# Patient Record
Sex: Male | Born: 1960 | Race: White | Hispanic: No | Smoking: Former smoker
Health system: Southern US, Community
[De-identification: ages and names within clinical notes are randomized; demographics above are authoritative.]

## PROBLEM LIST (undated history)

## (undated) DIAGNOSIS — J45909 Unspecified asthma, uncomplicated: Secondary | ICD-10-CM

## (undated) DIAGNOSIS — G4730 Sleep apnea, unspecified: Secondary | ICD-10-CM

## (undated) DIAGNOSIS — E875 Hyperkalemia: Secondary | ICD-10-CM

## (undated) DIAGNOSIS — Z8639 Personal history of other endocrine, nutritional and metabolic disease: Secondary | ICD-10-CM

## (undated) DIAGNOSIS — K921 Melena: Secondary | ICD-10-CM

## (undated) DIAGNOSIS — M109 Gout, unspecified: Secondary | ICD-10-CM

## (undated) DIAGNOSIS — Z789 Other specified health status: Secondary | ICD-10-CM

## (undated) DIAGNOSIS — K429 Umbilical hernia without obstruction or gangrene: Secondary | ICD-10-CM

## (undated) DIAGNOSIS — G959 Disease of spinal cord, unspecified: Secondary | ICD-10-CM

## (undated) DIAGNOSIS — E785 Hyperlipidemia, unspecified: Secondary | ICD-10-CM

## (undated) DIAGNOSIS — I10 Essential (primary) hypertension: Secondary | ICD-10-CM

## (undated) DIAGNOSIS — F339 Major depressive disorder, recurrent, unspecified: Secondary | ICD-10-CM

## (undated) DIAGNOSIS — M545 Low back pain, unspecified: Secondary | ICD-10-CM

## (undated) DIAGNOSIS — G8929 Other chronic pain: Secondary | ICD-10-CM

## (undated) DIAGNOSIS — G5600 Carpal tunnel syndrome, unspecified upper limb: Secondary | ICD-10-CM

## (undated) DIAGNOSIS — E119 Type 2 diabetes mellitus without complications: Secondary | ICD-10-CM

## (undated) DIAGNOSIS — R0789 Other chest pain: Secondary | ICD-10-CM

## (undated) DIAGNOSIS — R202 Paresthesia of skin: Secondary | ICD-10-CM

## (undated) DIAGNOSIS — G4733 Obstructive sleep apnea (adult) (pediatric): Secondary | ICD-10-CM

## (undated) HISTORY — DX: Paresthesia of skin: R20.2

## (undated) HISTORY — DX: Major depressive disorder, recurrent, unspecified: F33.9

## (undated) HISTORY — DX: Other specified health status: Z78.9

## (undated) HISTORY — DX: Sleep apnea, unspecified: G47.30

## (undated) HISTORY — DX: Type 2 diabetes mellitus without complications: E11.9

## (undated) HISTORY — DX: Hyperkalemia: E87.5

## (undated) HISTORY — DX: Disease of spinal cord, unspecified: G95.9

## (undated) HISTORY — DX: Obstructive sleep apnea (adult) (pediatric): G47.33

## (undated) HISTORY — DX: Umbilical hernia without obstruction or gangrene: K42.9

## (undated) HISTORY — DX: Other chronic pain: G89.29

## (undated) HISTORY — DX: Carpal tunnel syndrome, unspecified upper limb: G56.00

## (undated) HISTORY — DX: Unspecified asthma, uncomplicated: J45.909

## (undated) HISTORY — DX: Low back pain: M54.5

## (undated) HISTORY — DX: Gout, unspecified: M10.9

## (undated) HISTORY — DX: Melena: K92.1

## (undated) HISTORY — DX: Hyperlipidemia, unspecified: E78.5

## (undated) HISTORY — PX: WISDOM TOOTH EXTRACTION: SHX21

## (undated) HISTORY — DX: Personal history of other endocrine, nutritional and metabolic disease: Z86.39

## (undated) HISTORY — DX: Low back pain, unspecified: M54.50

## (undated) HISTORY — DX: Other chest pain: R07.89

## (undated) HISTORY — PX: VASECTOMY: SHX75

## (undated) HISTORY — DX: Essential (primary) hypertension: I10

---

## 2005-07-12 ENCOUNTER — Ambulatory Visit (HOSPITAL_COMMUNITY): Admission: RE | Admit: 2005-07-12 | Discharge: 2005-07-12 | Payer: Self-pay | Admitting: Orthopedic Surgery

## 2014-05-31 ENCOUNTER — Encounter: Payer: Self-pay | Admitting: Family Medicine

## 2014-05-31 ENCOUNTER — Ambulatory Visit (INDEPENDENT_AMBULATORY_CARE_PROVIDER_SITE_OTHER): Payer: BC Managed Care – PPO | Admitting: Family Medicine

## 2014-05-31 VITALS — BP 157/102 | HR 71 | Temp 98.8°F | Resp 18 | Ht 64.5 in | Wt 197.0 lb

## 2014-05-31 DIAGNOSIS — R030 Elevated blood-pressure reading, without diagnosis of hypertension: Secondary | ICD-10-CM

## 2014-05-31 DIAGNOSIS — IMO0001 Reserved for inherently not codable concepts without codable children: Secondary | ICD-10-CM

## 2014-05-31 DIAGNOSIS — M1000 Idiopathic gout, unspecified site: Secondary | ICD-10-CM

## 2014-05-31 DIAGNOSIS — R0789 Other chest pain: Secondary | ICD-10-CM

## 2014-05-31 DIAGNOSIS — Z0000 Encounter for general adult medical examination without abnormal findings: Secondary | ICD-10-CM

## 2014-05-31 DIAGNOSIS — Z125 Encounter for screening for malignant neoplasm of prostate: Secondary | ICD-10-CM

## 2014-05-31 NOTE — Progress Notes (Signed)
Office Note 06/12/2014  CC:  Chief Complaint  Patient presents with  . Establish Care    HPI:  Derrick Willis is a 53 y.o. White male who is here to establish care. Patient's most recent primary MD: [facility] for acute problems primarily--no specific MD as PMD. Old records were not reviewed prior to or during today's visit.  Has not had "regular" medical care for years. Reports hx of white coat HTN.  BP at home 130s/70s, HR 50s.  Having pressure in left lower anterior chest wall, "feels like it's in my lung"--chronically over the last several years.  Feels it constantly, worse with deep breaths.  Dx's with pleurisy in the past.   No exercise induced CP, nausea, diaphoresis, palpitations, jaw pain, or left arm pain.  Hx of BRBPR off and on, hx of hemorrhoids.  Has umbillical hernia x 1 yr, no pain but it bothers him when he does strenuous work b/c of pressure and discomfort in the area.  Past Medical History  Diagnosis Date  . Asthma     "grew out of it", esp after quitting smoking arond age 30 yrs old  . Blood in stool   . Hypertension     never on meds  . Hyperlipidemia     med x 1 trial=adverse side effects so he stopped it  . Gout     Knee and toe--allopurinol since approx 2012  . Atypical chest pain 06/12/2014    Past Surgical History  Procedure Laterality Date  . Vasectomy    . Wisdom tooth extraction      Family History  Problem Relation Age of Onset  . Arthritis Mother   . Arthritis Father   . Hyperlipidemia Father   . Hypertension Father     History   Social History  . Marital Status: Legally Separated    Spouse Name: N/A    Number of Children: N/A  . Years of Education: N/A   Occupational History  . Not on file.   Social History Main Topics  . Smoking status: Former Smoker    Quit date: 05/31/1990  . Smokeless tobacco: Never Used  . Alcohol Use: 0.6 - 1.2 oz/week    1-2 Glasses of wine per week     Comment: 3 x weekly  . Drug Use: No  .  Sexual Activity: Not on file   Other Topics Concern  . Not on file   Social History Narrative   Divorced.  Two children, both young adults (one son plans to go to med school).   Occupation: Grading contractor-also a Psychologist, sport and exercise.   Education: 2 years of college.   Orig from [place], still lives there.   Tob 30 pack-yr hx, quit around age 1.   Social drinker.     No regular exercise.  Eating habits poor.    Outpatient Encounter Prescriptions as of 05/31/2014  Medication Sig  . ALLOPURINOL PO Take 150 mg by mouth.  Marland Kitchen aspirin 81 MG tablet Take 81 mg by mouth daily.    No Known Allergies  ROS Review of Systems  Constitutional: Negative for fever and fatigue.  HENT: Negative for congestion and sore throat.   Eyes: Negative for visual disturbance.  Respiratory: Negative for cough and shortness of breath.   Cardiovascular: Negative for chest pain.  Gastrointestinal: Negative for nausea and abdominal pain.  Genitourinary: Negative for dysuria.  Musculoskeletal: Negative for back pain and joint swelling.  Skin: Negative for rash.  Neurological: Negative for weakness and headaches.  Hematological: Negative for adenopathy.    PE; Blood pressure 157/102, pulse 71, temperature 98.8 F (37.1 C), temperature source Temporal, resp. rate 18, height 5' 4.5" (1.638 m), weight 197 lb (89.359 kg), SpO2 97.00%. Gen: Alert, well appearing.  Patient is oriented to person, place, time, and situation. FEO:FHQR: no injection, icteris, swelling, or exudate.  EOMI, PERRLA. Mouth: lips without lesion/swelling.  Oral mucosa pink and moist. Oropharynx without erythema, exudate, or swelling.  CV: RRR, no m/r/g.   LUNGS: CTA bilat, nonlabored resps, good aeration in all lung fields. EXT: no clubbing, cyanosis, or edema.   Pertinent labs:  none  ASSESSMENT AND PLAN:   New pt: no pertinent old records to obtain.  Atypical chest pain Chronic, stable problem most likely related to some scarring from past  episode of pleurisy. Reassured.   Returne in 1-2 wks at pt's convenience for CPE, lab visit for fasting HP prior to this. He is due for routine colon and prostate cancer screening.  White coat hypertension Would like pt to continue to check bp at any chance he gets outside of medical office setting.   Will encourage him to invest in home bp cuff for ease of home monitoring so we don't miss dx of true HTN.   An After Visit Summary was printed and given to the patient.  Return for Lab visit ASAP (fasting).  CPE at pt's convenience in 1-2 weeks.Marland Kitchen

## 2014-05-31 NOTE — Progress Notes (Signed)
Pre visit review using our clinic review tool, if applicable. No additional management support is needed unless otherwise documented below in the visit note. 

## 2014-06-01 ENCOUNTER — Other Ambulatory Visit (INDEPENDENT_AMBULATORY_CARE_PROVIDER_SITE_OTHER): Payer: BC Managed Care – PPO

## 2014-06-01 DIAGNOSIS — Z0000 Encounter for general adult medical examination without abnormal findings: Secondary | ICD-10-CM

## 2014-06-01 DIAGNOSIS — Z125 Encounter for screening for malignant neoplasm of prostate: Secondary | ICD-10-CM

## 2014-06-01 DIAGNOSIS — M1000 Idiopathic gout, unspecified site: Secondary | ICD-10-CM

## 2014-06-01 LAB — LIPID PANEL
Cholesterol: 197 mg/dL (ref 0–200)
HDL: 44.8 mg/dL (ref >39.00)
LDL Cholesterol: 131 mg/dL — ABNORMAL HIGH (ref 0–99)
NonHDL: 152.2
Total CHOL/HDL Ratio: 4
Triglycerides: 105 mg/dL (ref 0.0–149.0)
VLDL: 21 mg/dL (ref 0.0–40.0)

## 2014-06-01 LAB — COMPREHENSIVE METABOLIC PANEL
ALT: 49 U/L (ref 0–53)
AST: 25 U/L (ref 0–37)
Albumin: 3.7 g/dL (ref 3.5–5.2)
Alkaline Phosphatase: 66 U/L (ref 39–117)
BUN: 16 mg/dL (ref 6–23)
CO2: 27 mEq/L (ref 19–32)
Calcium: 9.3 mg/dL (ref 8.4–10.5)
Chloride: 106 mEq/L (ref 96–112)
Creatinine, Ser: 1.1 mg/dL (ref 0.4–1.5)
GFR: 77.46 mL/min (ref >60.00)
Glucose, Bld: 151 mg/dL — ABNORMAL HIGH (ref 70–99)
Potassium: 5.6 mEq/L — ABNORMAL HIGH (ref 3.5–5.1)
Sodium: 142 mEq/L (ref 135–145)
Total Bilirubin: 0.6 mg/dL (ref 0.2–1.2)
Total Protein: 7 g/dL (ref 6.0–8.3)

## 2014-06-01 LAB — CBC WITH DIFFERENTIAL/PLATELET
Basophils Absolute: 0.1 10*3/uL (ref 0.0–0.1)
Basophils Relative: 0.5 % (ref 0.0–3.0)
Eosinophils Absolute: 0.2 10*3/uL (ref 0.0–0.7)
Eosinophils Relative: 1.8 % (ref 0.0–5.0)
HCT: 49.5 % (ref 39.0–52.0)
Hemoglobin: 16.5 g/dL (ref 13.0–17.0)
Lymphocytes Relative: 20 % (ref 12.0–46.0)
Lymphs Abs: 1.9 10*3/uL (ref 0.7–4.0)
MCHC: 33.2 g/dL (ref 30.0–36.0)
MCV: 88 fl (ref 78.0–100.0)
Monocytes Absolute: 0.5 10*3/uL (ref 0.1–1.0)
Monocytes Relative: 5.2 % (ref 3.0–12.0)
Neutro Abs: 7.1 10*3/uL (ref 1.4–7.7)
Neutrophils Relative %: 72.5 % (ref 43.0–77.0)
Platelets: 196 10*3/uL (ref 150.0–400.0)
RBC: 5.63 Mil/uL (ref 4.22–5.81)
RDW: 12.8 % (ref 11.5–15.5)
WBC: 9.7 10*3/uL (ref 4.0–10.5)

## 2014-06-01 LAB — TSH: TSH: 1.91 u[IU]/mL (ref 0.35–4.50)

## 2014-06-01 LAB — PSA: PSA: 1.56 ng/mL (ref 0.10–4.00)

## 2014-06-01 LAB — URIC ACID: Uric Acid, Serum: 5.8 mg/dL (ref 4.0–7.8)

## 2014-06-03 ENCOUNTER — Other Ambulatory Visit: Payer: Self-pay | Admitting: Family Medicine

## 2014-06-03 DIAGNOSIS — E875 Hyperkalemia: Secondary | ICD-10-CM

## 2014-06-06 ENCOUNTER — Other Ambulatory Visit (INDEPENDENT_AMBULATORY_CARE_PROVIDER_SITE_OTHER): Payer: BC Managed Care – PPO

## 2014-06-06 DIAGNOSIS — E875 Hyperkalemia: Secondary | ICD-10-CM

## 2014-06-06 LAB — BASIC METABOLIC PANEL
BUN: 14 mg/dL (ref 6–23)
CO2: 30 meq/L (ref 19–32)
CREATININE: 1.1 mg/dL (ref 0.4–1.5)
Calcium: 9.5 mg/dL (ref 8.4–10.5)
Chloride: 105 mEq/L (ref 96–112)
GFR: 78.31 mL/min (ref >60.00)
GLUCOSE: 139 mg/dL — AB (ref 70–99)
Potassium: 5.2 mEq/L — ABNORMAL HIGH (ref 3.5–5.1)
SODIUM: 140 meq/L (ref 135–145)

## 2014-06-12 ENCOUNTER — Encounter: Payer: Self-pay | Admitting: Family Medicine

## 2014-06-12 DIAGNOSIS — R0789 Other chest pain: Secondary | ICD-10-CM | POA: Insufficient documentation

## 2014-06-12 DIAGNOSIS — IMO0001 Reserved for inherently not codable concepts without codable children: Secondary | ICD-10-CM | POA: Insufficient documentation

## 2014-06-12 HISTORY — DX: Other chest pain: R07.89

## 2014-06-12 NOTE — Assessment & Plan Note (Signed)
Would like pt to continue to check bp at any chance he gets outside of medical office setting.   Will encourage him to invest in home bp cuff for ease of home monitoring so we don't miss dx of true HTN.

## 2014-06-12 NOTE — Assessment & Plan Note (Signed)
Chronic, stable problem most likely related to some scarring from past episode of pleurisy. Reassured.   Returne in 1-2 wks at pt's convenience for CPE, lab visit for fasting HP prior to this. He is due for routine colon and prostate cancer screening.

## 2014-08-26 DIAGNOSIS — K429 Umbilical hernia without obstruction or gangrene: Secondary | ICD-10-CM

## 2014-08-26 HISTORY — DX: Umbilical hernia without obstruction or gangrene: K42.9

## 2014-09-05 ENCOUNTER — Encounter: Payer: Self-pay | Admitting: Gastroenterology

## 2014-09-05 ENCOUNTER — Ambulatory Visit (INDEPENDENT_AMBULATORY_CARE_PROVIDER_SITE_OTHER): Payer: 59 | Admitting: Family Medicine

## 2014-09-05 ENCOUNTER — Encounter: Payer: Self-pay | Admitting: Family Medicine

## 2014-09-05 VITALS — BP 152/90 | HR 62 | Temp 98.1°F | Resp 18 | Ht 65.5 in | Wt 199.0 lb

## 2014-09-05 DIAGNOSIS — E875 Hyperkalemia: Secondary | ICD-10-CM

## 2014-09-05 DIAGNOSIS — Z1211 Encounter for screening for malignant neoplasm of colon: Secondary | ICD-10-CM

## 2014-09-05 DIAGNOSIS — R739 Hyperglycemia, unspecified: Secondary | ICD-10-CM

## 2014-09-05 LAB — HEMOGLOBIN A1C: Hgb A1c MFr Bld: 7 % — ABNORMAL HIGH (ref 4.6–6.5)

## 2014-09-05 NOTE — Progress Notes (Signed)
Pre visit review using our clinic review tool, if applicable. No additional management support is needed unless otherwise documented below in the visit note. 

## 2014-09-05 NOTE — Progress Notes (Signed)
OFFICE NOTE  09/05/2014  CC:  Chief Complaint  Patient presents with  . Follow-up   HPI: Patient is a 54 y.o. Caucasian male who is here for "follow up blood results". Reviewed potassium recheck that patient never got word of. Reviewed glucoses as well: says he was fasting for these.  Denies polydipsia but endorses polyuria. Denies hx of hyperglycemia.  Pertinent PMH:  Past Medical History  Diagnosis Date  . Asthma     "grew out of it", esp after quitting smoking arond age 71 yrs old  . Blood in stool   . Hypertension     never on meds  . Hyperlipidemia     med x 1 trial=adverse side effects so he stopped it  . Gout     Knee and toe--allopurinol since approx 2012  . Atypical chest pain 06/12/2014   Past Surgical History  Procedure Laterality Date  . Vasectomy    . Wisdom tooth extraction      MEDS:  Outpatient Prescriptions Prior to Visit  Medication Sig Dispense Refill  . ALLOPURINOL PO Take 150 mg by mouth.    Marland Kitchen aspirin 81 MG tablet Take 81 mg by mouth daily.     No facility-administered medications prior to visit.    PE: Blood pressure 152/90, pulse 62, temperature 98.1 F (36.7 C), temperature source Oral, resp. rate 18, height 5' 5.5" (1.664 m), weight 199 lb (90.266 kg), SpO2 97 %. Gen: Alert, well appearing.  Patient is oriented to person, place, time, and situation. No further exam today.  LAB: none today RECENT: Glucose 151 on 06/01/14 and 139 on 06/06/14.  Lab Results  Component Value Date   WBC 9.7 06/01/2014   HGB 16.5 06/01/2014   HCT 49.5 06/01/2014   MCV 88.0 06/01/2014   PLT 196.0 06/01/2014   Lab Results  Component Value Date   CHOL 197 06/01/2014   HDL 44.80 06/01/2014   LDLCALC 131* 06/01/2014   TRIG 105.0 06/01/2014   CHOLHDL 4 06/01/2014     Chemistry      Component Value Date/Time   NA 140 06/06/2014 0840   K 5.2* 06/06/2014 0840   CL 105 06/06/2014 0840   CO2 30 06/06/2014 0840   BUN 14 06/06/2014 0840   CREATININE 1.1  06/06/2014 0840      Component Value Date/Time   CALCIUM 9.5 06/06/2014 0840   ALKPHOS 66 06/01/2014 0812   AST 25 06/01/2014 0812   ALT 49 06/01/2014 0812   BILITOT 0.6 06/01/2014 0812     Lab Results  Component Value Date   TSH 1.91 06/01/2014   Lab Results  Component Value Date   PSA 1.56 06/01/2014    IMPRESSION AND PLAN:  1) Hx of mild hyperkalemia: pt runs borderline high as his normal. He admits he was taking some dietary supplements when his potassium came back 5.6. He is off of these now, low K diet discussed and handout given to patient today.  2) Hyperglycemia: I did not know he was fasting on both both draws that showed glucoses >126, but he says today that he was. He likely has DM 2.  Will repeat random glucose today and HbA1c.  3) Colon cancer screening: pt asked for referral today for screening colonoscopy b/c he says he has never had one. Referral ordered as per pt request.  An After Visit Summary was printed and given to the patient.  FOLLOW UP:  66mo for fasting CPE, earlier if needed based on sx's or  lab results.

## 2014-09-06 ENCOUNTER — Other Ambulatory Visit: Payer: Self-pay | Admitting: Family Medicine

## 2014-09-06 ENCOUNTER — Encounter: Payer: Self-pay | Admitting: Family Medicine

## 2014-09-06 DIAGNOSIS — E119 Type 2 diabetes mellitus without complications: Secondary | ICD-10-CM

## 2014-09-06 DIAGNOSIS — E875 Hyperkalemia: Secondary | ICD-10-CM

## 2014-09-06 DIAGNOSIS — I10 Essential (primary) hypertension: Secondary | ICD-10-CM

## 2014-09-06 LAB — GLUCOSE, RANDOM: Glucose, Bld: 145 mg/dL — ABNORMAL HIGH (ref 70–99)

## 2014-09-06 MED ORDER — METFORMIN HCL 500 MG PO TABS: 500.0000 mg | ORAL_TABLET | Freq: Two times a day (BID) | ORAL | Status: DC

## 2014-09-26 ENCOUNTER — Encounter: Payer: Self-pay | Admitting: Family Medicine

## 2014-09-26 ENCOUNTER — Telehealth: Payer: Self-pay | Admitting: Family Medicine

## 2014-09-26 DIAGNOSIS — I10 Essential (primary) hypertension: Secondary | ICD-10-CM

## 2014-09-26 DIAGNOSIS — E875 Hyperkalemia: Secondary | ICD-10-CM

## 2014-09-26 NOTE — Telephone Encounter (Signed)
Pls call pt and tell him to get a blood draw at [facility] lab when he goes to that building for his GI appt (looks like he's on their schedule 09/29/14).  Pls tell him where lab is in that building (basement). Labs are ordered: it is to repeat his potassium b/c of history of elevated potassium. 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

## 2014-09-26 NOTE — Telephone Encounter (Signed)
Patient aware.  He is taking lisinopril.

## 2014-09-26 NOTE — Telephone Encounter (Signed)
LMOM for pt to CB.  

## 2014-09-26 NOTE — Telephone Encounter (Signed)
Noted  

## 2014-09-29 ENCOUNTER — Other Ambulatory Visit (INDEPENDENT_AMBULATORY_CARE_PROVIDER_SITE_OTHER): Payer: 59

## 2014-09-29 ENCOUNTER — Ambulatory Visit (AMBULATORY_SURGERY_CENTER): Payer: Self-pay | Admitting: *Deleted

## 2014-09-29 VITALS — Ht 65.0 in | Wt 194.6 lb

## 2014-09-29 DIAGNOSIS — E875 Hyperkalemia: Secondary | ICD-10-CM

## 2014-09-29 DIAGNOSIS — I10 Essential (primary) hypertension: Secondary | ICD-10-CM

## 2014-09-29 DIAGNOSIS — Z1211 Encounter for screening for malignant neoplasm of colon: Secondary | ICD-10-CM

## 2014-09-29 LAB — BASIC METABOLIC PANEL
BUN: 17 mg/dL (ref 6–23)
CO2: 29 mEq/L (ref 19–32)
CREATININE: 1.14 mg/dL (ref 0.40–1.50)
Calcium: 9.5 mg/dL (ref 8.4–10.5)
Chloride: 107 mEq/L (ref 96–112)
GFR: 71.13 mL/min (ref >60.00)
Glucose, Bld: 111 mg/dL — ABNORMAL HIGH (ref 70–99)
Potassium: 4.9 mEq/L (ref 3.5–5.1)
Sodium: 142 mEq/L (ref 135–145)

## 2014-09-29 MED ORDER — MOVIPREP 100 G PO SOLR: 1.0000 | Freq: Once | ORAL | Status: DC

## 2014-09-29 NOTE — Progress Notes (Signed)
No egg or soy allergy No home 02 use No diet pills No past sedation per pt. emmi video to e mail

## 2014-10-13 ENCOUNTER — Ambulatory Visit (AMBULATORY_SURGERY_CENTER): Payer: 59 | Admitting: Gastroenterology

## 2014-10-13 ENCOUNTER — Encounter: Payer: Self-pay | Admitting: Gastroenterology

## 2014-10-13 VITALS — BP 105/66 | HR 50 | Temp 96.8°F | Resp 17 | Ht 65.0 in | Wt 194.0 lb

## 2014-10-13 DIAGNOSIS — D124 Benign neoplasm of descending colon: Secondary | ICD-10-CM

## 2014-10-13 DIAGNOSIS — K635 Polyp of colon: Secondary | ICD-10-CM

## 2014-10-13 DIAGNOSIS — Z1211 Encounter for screening for malignant neoplasm of colon: Secondary | ICD-10-CM

## 2014-10-13 HISTORY — PX: COLONOSCOPY W/ POLYPECTOMY: SHX1380

## 2014-10-13 MED ORDER — SODIUM CHLORIDE 0.9 % IV SOLN: 500.0000 mL | INTRAVENOUS | Status: DC

## 2014-10-13 NOTE — Op Note (Signed)
[facility]  [address]. [place], [id number]   COLONOSCOPY PROCEDURE REPORT  PATIENT: Derrick Willis, Derrick Willis  MR#: [id number] BIRTHDATE: 04/18/1961 , 62  yrs. old GENDER: male ENDOSCOPIST: Ladene Artist, MD, FACG REFERRED [id number], Phillip PROCEDURE DATE:  10/13/2014 PROCEDURE:   Colonoscopy with snare polypectomy First Screening Colonoscopy - Avg.  risk and is 50 yrs.  old or older Yes.  Prior Negative Screening - Now for repeat screening. N/A  History of Adenoma - Now for follow-up colonoscopy & has been > or = to 3 yrs.  N/A  Polyps Removed Today? Yes. ASA CLASS:   Class II INDICATIONS:average risk patient for colorectal cancer. MEDICATIONS: Monitored anesthesia care and Propofol 200 mg IV DESCRIPTION OF PROCEDURE:   After the risks benefits and alternatives of the procedure were thoroughly explained, informed consent was obtained.  The digital rectal exam revealed no abnormalities of the rectum.   The LB [id number]  endoscope was introduced through the anus and advanced to the cecum, which was identified by both the appendix and ileocecal valve. No adverse events experienced.   The quality of the prep was excellent, using MoviPrep  The instrument was then slowly withdrawn as the colon was fully examined.    COLON FINDINGS: A pedunculated polyp measuring 7 mm in size was found in the descending colon.  A polypectomy was performed with a cold snare.  The resection was complete, the polyp tissue was completely retrieved and sent to histology.   The examination was otherwise normal.  Retroflexed views revealed internal Grade I hemorrhoids. The time to cecum=1 minutes 11 seconds.  Withdrawal time=10 minutes 15 seconds.  The scope was withdrawn and the procedure completed. COMPLICATIONS: There were no immediate complications.  ENDOSCOPIC IMPRESSION: 1.   Pedunculated polyp in the descending colon; polypectomy performed with a cold snare 2.   Grade l  internal hemorrhoids  RECOMMENDATIONS: 1.  Hold Aspirin and all other NSAIDS for 2 weeks. 2.  Await pathology results 3.  Repeat colonoscopy in 5 years if polyp adenomatous; otherwise 10 years  eSigned:  Ladene Artist, MD, [facility] 10/13/2014 8:49 AM

## 2014-10-13 NOTE — Progress Notes (Signed)
Called to room to assist during endoscopic procedure.  Patient ID and intended procedure confirmed with present staff. Received instructions for my participation in the procedure from the performing physician.  

## 2014-10-13 NOTE — Patient Instructions (Signed)
YOU HAD AN ENDOSCOPIC PROCEDURE TODAY AT THE [facility]: Refer to the procedure report that was given to you for any specific questions about what was found during the examination.  If the procedure report does not answer your questions, please call your gastroenterologist to clarify.  If you requested that your care partner not be given the details of your procedure findings, then the procedure report has been included in a sealed envelope for you to review at your convenience later.  YOU SHOULD EXPECT: Some feelings of bloating in the abdomen. Passage of more gas than usual.  Walking can help get rid of the air that was put into your GI tract during the procedure and reduce the bloating. If you had a lower endoscopy (such as a colonoscopy or flexible sigmoidoscopy) you may notice spotting of blood in your stool or on the toilet paper. If you underwent a bowel prep for your procedure, then you may not have a normal bowel movement for a few days.  DIET: Your first meal following the procedure should be a light meal and then it is ok to progress to your normal diet.  A half-sandwich or bowl of soup is an example of a good first meal.  Heavy or fried foods are harder to digest and may make you feel nauseous or bloated.  Likewise meals heavy in dairy and vegetables can cause extra gas to form and this can also increase the bloating.  Drink plenty of fluids but you should avoid alcoholic beverages for 24 hours.  ACTIVITY: Your care partner should take you home directly after the procedure.  You should plan to take it easy, moving slowly for the rest of the day.  You can resume normal activity the day after the procedure however you should NOT DRIVE or use heavy machinery for 24 hours (because of the sedation medicines used during the test).    SYMPTOMS TO REPORT IMMEDIATELY: A gastroenterologist can be reached at any hour.  During normal business hours, 8:30 AM to 5:00 PM Monday through Friday,  call [phone].  After hours and on weekends, please call the GI answering service at [phone] who will take a message and have the physician on call contact you.   Following lower endoscopy (colonoscopy or flexible sigmoidoscopy):  Excessive amounts of blood in the stool  Significant tenderness or worsening of abdominal pains  Swelling of the abdomen that is new, acute  Fever of 100F or higher  FOLLOW UP: If any biopsies were taken you will be contacted by phone or by letter within the next 1-3 weeks.  Call your gastroenterologist if you have not heard about the biopsies in 3 weeks.  Our staff will call the home number listed on your records the next business day following your procedure to check on you and address any questions or concerns that you may have at that time regarding the information given to you following your procedure. This is a courtesy call and so if there is no answer at the home number and we have not heard from you through the emergency physician on call, we will assume that you have returned to your regular daily activities without incident.  SIGNATURES/CONFIDENTIALITY: You and/or your care partner have signed paperwork which will be entered into your electronic medical record.  These signatures attest to the fact that that the information above on your After Visit Summary has been reviewed and is understood.  Full responsibility of the confidentiality of this   discharge information lies with you and/or your care-partner.  Polyps, hemorrhoids, high fiber diet-handouts given

## 2014-10-13 NOTE — Progress Notes (Signed)
A/ox3 pleased with MAC, report to April RN 

## 2014-10-14 ENCOUNTER — Telehealth: Payer: Self-pay | Admitting: *Deleted

## 2014-10-14 NOTE — Telephone Encounter (Signed)
  Follow up Call-  Call back number 10/13/2014  Post procedure Call Back phone  # [phone]  Permission to leave phone message Yes     Patient questions:  Do you have a fever, pain , or abdominal swelling? No. Pain Score  0 *  Have you tolerated food without any problems? Yes.    Have you been able to return to your normal activities? Yes.    Do you have any questions about your discharge instructions: Diet   No. Medications  No. Follow up visit  No.  Do you have questions or concerns about your Care? No.  Actions: * If pain score is 4 or above: No action needed, pain <4.

## 2014-10-18 ENCOUNTER — Encounter: Payer: Self-pay | Admitting: Gastroenterology

## 2014-11-07 ENCOUNTER — Encounter: Payer: 59 | Admitting: Nutrition

## 2014-11-21 ENCOUNTER — Telehealth: Payer: Self-pay | Admitting: Nutrition

## 2014-11-21 NOTE — Telephone Encounter (Signed)
Called and left message to call to make appointment. Jearld Fenton, RDN CDE

## 2014-11-28 ENCOUNTER — Encounter: Payer: Self-pay | Admitting: Family Medicine

## 2014-11-28 ENCOUNTER — Other Ambulatory Visit (INDEPENDENT_AMBULATORY_CARE_PROVIDER_SITE_OTHER): Payer: 59

## 2014-11-28 DIAGNOSIS — Z0000 Encounter for general adult medical examination without abnormal findings: Secondary | ICD-10-CM

## 2014-11-28 LAB — CBC WITH DIFFERENTIAL/PLATELET
Basophils Absolute: 0 10*3/uL (ref 0.0–0.1)
Basophils Relative: 0.6 % (ref 0.0–3.0)
Eosinophils Absolute: 0.2 10*3/uL (ref 0.0–0.7)
Eosinophils Relative: 1.8 % (ref 0.0–5.0)
HEMATOCRIT: 47.7 % (ref 39.0–52.0)
Hemoglobin: 16 g/dL (ref 13.0–17.0)
LYMPHS ABS: 1.6 10*3/uL (ref 0.7–4.0)
LYMPHS PCT: 18.6 % (ref 12.0–46.0)
MCHC: 33.5 g/dL (ref 30.0–36.0)
MCV: 86.4 fl (ref 78.0–100.0)
MONOS PCT: 4.6 % (ref 3.0–12.0)
Monocytes Absolute: 0.4 10*3/uL (ref 0.1–1.0)
Neutro Abs: 6.6 10*3/uL (ref 1.4–7.7)
Neutrophils Relative %: 74.4 % (ref 43.0–77.0)
PLATELETS: 199 10*3/uL (ref 150.0–400.0)
RBC: 5.53 Mil/uL (ref 4.22–5.81)
RDW: 13.6 % (ref 11.5–15.5)
WBC: 8.8 10*3/uL (ref 4.0–10.5)

## 2014-11-28 LAB — LIPID PANEL
CHOL/HDL RATIO: 4
Cholesterol: 158 mg/dL (ref 0–200)
HDL: 44 mg/dL (ref >39.00)
LDL CALC: 92 mg/dL (ref 0–99)
NonHDL: 114
Triglycerides: 111 mg/dL (ref 0.0–149.0)
VLDL: 22.2 mg/dL (ref 0.0–40.0)

## 2014-11-28 LAB — COMPREHENSIVE METABOLIC PANEL
ALBUMIN: 4.2 g/dL (ref 3.5–5.2)
ALT: 20 U/L (ref 0–53)
AST: 16 U/L (ref 0–37)
Alkaline Phosphatase: 64 U/L (ref 39–117)
BUN: 22 mg/dL (ref 6–23)
CALCIUM: 9.7 mg/dL (ref 8.4–10.5)
CHLORIDE: 106 meq/L (ref 96–112)
CO2: 31 mEq/L (ref 19–32)
CREATININE: 1.11 mg/dL (ref 0.40–1.50)
GFR: 73.31 mL/min (ref >60.00)
GLUCOSE: 128 mg/dL — AB (ref 70–99)
POTASSIUM: 5.5 meq/L — AB (ref 3.5–5.1)
Sodium: 140 mEq/L (ref 135–145)
Total Bilirubin: 0.6 mg/dL (ref 0.2–1.2)
Total Protein: 6.4 g/dL (ref 6.0–8.3)

## 2014-11-28 LAB — TSH: TSH: 1.3 u[IU]/mL (ref 0.35–4.50)

## 2014-12-05 ENCOUNTER — Ambulatory Visit (INDEPENDENT_AMBULATORY_CARE_PROVIDER_SITE_OTHER): Payer: 59 | Admitting: Family Medicine

## 2014-12-05 ENCOUNTER — Encounter: Payer: Self-pay | Admitting: Family Medicine

## 2014-12-05 VITALS — BP 124/64 | HR 51 | Temp 97.6°F | Ht 65.5 in | Wt 181.0 lb

## 2014-12-05 DIAGNOSIS — Z23 Encounter for immunization: Secondary | ICD-10-CM

## 2014-12-05 DIAGNOSIS — I10 Essential (primary) hypertension: Secondary | ICD-10-CM | POA: Diagnosis not present

## 2014-12-05 DIAGNOSIS — E119 Type 2 diabetes mellitus without complications: Secondary | ICD-10-CM

## 2014-12-05 DIAGNOSIS — E875 Hyperkalemia: Secondary | ICD-10-CM | POA: Diagnosis not present

## 2014-12-05 LAB — BASIC METABOLIC PANEL
BUN: 12 mg/dL (ref 6–23)
CALCIUM: 9.7 mg/dL (ref 8.4–10.5)
CO2: 32 mEq/L (ref 19–32)
Chloride: 108 mEq/L (ref 96–112)
Creatinine, Ser: 0.98 mg/dL (ref 0.40–1.50)
GFR: 84.64 mL/min (ref >60.00)
GLUCOSE: 120 mg/dL — AB (ref 70–99)
Potassium: 5.4 mEq/L — ABNORMAL HIGH (ref 3.5–5.1)
Sodium: 142 mEq/L (ref 135–145)

## 2014-12-05 LAB — MICROALBUMIN / CREATININE URINE RATIO
CREATININE, U: 285.3 mg/dL
MICROALB UR: 1.1 mg/dL (ref 0.0–1.9)
MICROALB/CREAT RATIO: 0.4 mg/g (ref 0.0–30.0)

## 2014-12-05 LAB — HEMOGLOBIN A1C: HEMOGLOBIN A1C: 5.9 % (ref 4.6–6.5)

## 2014-12-05 NOTE — Addendum Note (Signed)
Addended by: Julieta Bellini on: 12/05/2014 09:18 AM   Modules accepted: Orders, SmartSet

## 2014-12-05 NOTE — Progress Notes (Signed)
Pre visit review using our clinic review tool, if applicable. No additional management support is needed unless otherwise documented below in the visit note. 

## 2014-12-05 NOTE — Progress Notes (Signed)
OFFICE NOTE  12/05/2014  CC:  Chief Complaint  Patient presents with  . Annual Exam    HPI: Patient is a 54 y.o. Caucasian male who is here for f/u DM and HTN. Has been working hard on TLC: hiking, walking, dieting.  BP monitoring at home: 120s/70, HR 50s usually. No home glucose monitoring. No burning, tingling, or numbness in feet.  No vision complaints.  Recent labs showed K 5.5, gluc 128 fasting, o/w normal.  No A1c done since 08/2014.   Pertinent PMH:  Past medical, surgical, social, and family history reviewed and no changes are noted since last office visit.  MEDS:  Outpatient Prescriptions Prior to Visit  Medication Sig Dispense Refill  . ALLOPURINOL PO Take 150 mg by mouth.    Marland Kitchen aspirin 81 MG tablet Take 81 mg by mouth daily.    Marland Kitchen lisinopril (PRINIVIL,ZESTRIL) 10 MG tablet Take 10 mg by mouth daily.    . metFORMIN (GLUCOPHAGE) 500 MG tablet Take 1 tablet (500 mg total) by mouth 2 (two) times daily with a meal. 60 tablet 4  . Misc Natural Products (COMPLETE PROSTATE HEALTH PO) Take 1 tablet by mouth daily.    Marland Kitchen OVER THE COUNTER MEDICATION Take 1 tablet by mouth daily. cholestoff-over the counter supplement     No facility-administered medications prior to visit.    PE: Blood pressure 124/64, pulse 51, temperature 97.6 F (36.4 C), temperature source Oral, height 5' 5.5" (1.664 m), weight 181 lb (82.101 kg), SpO2 97 %. Gen: Alert, well appearing.  Patient is oriented to person, place, time, and situation. AFFECT: pleasant, lucid thought and speech. No further exam today.  LAB:  Lab Results  Component Value Date   HGBA1C 7.0* 09/05/2014     Chemistry      Component Value Date/Time   NA 140 11/28/2014 0810   K 5.5* 11/28/2014 0810   CL 106 11/28/2014 0810   CO2 31 11/28/2014 0810   BUN 22 11/28/2014 0810   CREATININE 1.11 11/28/2014 0810      Component Value Date/Time   CALCIUM 9.7 11/28/2014 0810   ALKPHOS 64 11/28/2014 0810   AST 16 11/28/2014 0810    ALT 20 11/28/2014 0810   BILITOT 0.6 11/28/2014 0810     Lab Results  Component Value Date   CHOL 158 11/28/2014   HDL 44.00 11/28/2014   LDLCALC 92 11/28/2014   TRIG 111.0 11/28/2014   CHOLHDL 4 11/28/2014   Lab Results  Component Value Date   TSH 1.30 11/28/2014   Lab Results  Component Value Date   WBC 8.8 11/28/2014   HGB 16.0 11/28/2014   HCT 47.7 11/28/2014   MCV 86.4 11/28/2014   PLT 199.0 11/28/2014   Lab Results  Component Value Date   PSA 1.56 06/01/2014    IMPRESSION AND PLAN:  1) DM 2, great TLC since dx. Repeat A1c today, do urine microalb/cr today. Refer to ophtho for diab retpthy screening. No home monitoring at this time. Will do feet exam at next f/u in 4 mo. Pneumovax 23 given today.  2) HTN; The current medical regimen is effective.  However, with his hx of hyperkalemia even while on low K diet I will have him cut his lisinopril 10mg  tab in 1/2 daily.  3) Hyperkalemia: low K diet, decrease lisinopril to 1/2 of 10mg  tab qd. Will recheck K today to compare to 1 wk ago and make sure it is not rising.  An After Visit Summary was printed and  given to the patient.  FOLLOW UP: 4 mo

## 2014-12-06 ENCOUNTER — Telehealth: Payer: Self-pay | Admitting: *Deleted

## 2014-12-06 ENCOUNTER — Encounter: Payer: Self-pay | Admitting: Family Medicine

## 2014-12-06 NOTE — Telephone Encounter (Signed)
error 

## 2014-12-12 ENCOUNTER — Other Ambulatory Visit: Payer: Self-pay | Admitting: Family Medicine

## 2014-12-12 MED ORDER — LISINOPRIL 5 MG PO TABS: 5.0000 mg | ORAL_TABLET | Freq: Every day | ORAL | Status: DC

## 2014-12-30 LAB — HM DIABETES EYE EXAM

## 2015-01-17 ENCOUNTER — Ambulatory Visit (INDEPENDENT_AMBULATORY_CARE_PROVIDER_SITE_OTHER): Payer: 59 | Admitting: Family Medicine

## 2015-01-17 ENCOUNTER — Encounter: Payer: Self-pay | Admitting: *Deleted

## 2015-01-17 ENCOUNTER — Encounter: Payer: Self-pay | Admitting: Family Medicine

## 2015-01-17 ENCOUNTER — Emergency Department (INDEPENDENT_AMBULATORY_CARE_PROVIDER_SITE_OTHER): Payer: 59

## 2015-01-17 ENCOUNTER — Emergency Department
Admission: EM | Admit: 2015-01-17 | Discharge: 2015-01-17 | Disposition: A | Discharge status: Home / Self Care | Payer: 59 | Source: Home / Self Care | Attending: Emergency Medicine | Admitting: Emergency Medicine

## 2015-01-17 VITALS — BP 126/83 | HR 54 | Temp 98.7°F | Resp 18 | Ht 65.5 in | Wt 178.0 lb

## 2015-01-17 DIAGNOSIS — R0781 Pleurodynia: Secondary | ICD-10-CM | POA: Diagnosis not present

## 2015-01-17 DIAGNOSIS — S20212A Contusion of left front wall of thorax, initial encounter: Secondary | ICD-10-CM

## 2015-01-17 DIAGNOSIS — R0789 Other chest pain: Secondary | ICD-10-CM

## 2015-01-17 MED ORDER — HYDROCODONE-ACETAMINOPHEN 5-325 MG PO TABS: 2.0000 | ORAL_TABLET | ORAL | Status: DC | PRN

## 2015-01-17 MED ORDER — IBUPROFEN 800 MG PO TABS: 800.0000 mg | ORAL_TABLET | Freq: Three times a day (TID) | ORAL | Status: DC

## 2015-01-17 NOTE — ED Provider Notes (Signed)
CSN: [id number]     Arrival date & time 01/17/15  1238 History   First MD Initiated Contact with Patient 01/17/15 1253     Chief Complaint  Patient presents with  . Left Rib/hip pain    (Consider location/radiation/quality/duration/timing/severity/associated sxs/prior Treatment) Patient is a 54 y.o. male presenting with chest pain. The history is provided by the patient.  Chest Pain Pain location:  L lateral chest Pain quality: aching   Pain radiates to the back: no   Pain severity:  Moderate Onset quality:  Sudden Duration:  2 weeks Timing:  Constant Progression:  Worsening Chronicity:  New Context: raising an arm   Relieved by:  Nothing Worsened by:  Certain positions Ineffective treatments:  None tried Associated symptoms: no shortness of breath   Risk factors: hypertension   Risk factors: no high cholesterol   Pt complains of continued soreness.  Pt thinks he may have broke ribs.   Pt hit on metal tracks.  Past Medical History  Diagnosis Date  . Asthma     "grew out of it", esp after quitting smoking arond age 56 yrs old  . Blood in stool   . Hypertension   . Hyperlipidemia     med x 1 trial=adverse side effects so he stopped it.  LDL 130s 08/2014, then dx'd with DM 2 shortly after, so he needs to have another statin trial at f/u spring 2016.  Marland Kitchen Gout     Knee and toe--allopurinol since approx 2012  . Atypical chest pain 06/12/2014  . Diabetes mellitus without complication   . Sleep apnea     has cpap but dosnt use much   . Hyperkalemia 2015/2016    low K diet; cut lisinopril from 10mg  to 5mg  qd 11/2014.   Past Surgical History  Procedure Laterality Date  . Vasectomy    . Wisdom tooth extraction    . Colonoscopy w/ polypectomy  10/13/14    Hyperplastic; recall 10 yrs   Family History  Problem Relation Age of Onset  . Arthritis Mother   . Arthritis Father   . Hyperlipidemia Father   . Hypertension Father   . Colon polyps Neg Hx   . Esophageal cancer Neg Hx    . Rectal cancer Neg Hx   . Stomach cancer Neg Hx    History  Substance Use Topics  . Smoking status: Current Some Day Smoker    Types: Cigars    Last Attempt to Quit: 05/31/1990  . Smokeless tobacco: Never Used  . Alcohol Use: 0.6 - 1.2 oz/week    1-2 Glasses of wine per week     Comment: 3 x weekly    Review of Systems  Respiratory: Negative for shortness of breath.   Cardiovascular: Positive for chest pain.  All other systems reviewed and are negative.   Allergies  Review of patient's allergies indicates no known allergies.  Home Medications   Prior to Admission medications   Medication Sig Start Date End Date Taking? Authorizing Provider  aspirin 81 MG tablet Take 81 mg by mouth daily.   Yes Historical Provider, MD  lisinopril (PRINIVIL,ZESTRIL) 5 MG tablet Take 1 tablet (5 mg total) by mouth daily. 12/12/14  Yes Tammi Sou, MD  ALLOPURINOL PO Take 150 mg by mouth.    Historical Provider, MD  metFORMIN (GLUCOPHAGE) 500 MG tablet Take 1 tablet (500 mg total) by mouth 2 (two) times daily with a meal. 09/06/14   Tammi Sou, MD  Misc Natural Products (  COMPLETE PROSTATE HEALTH PO) Take 1 tablet by mouth daily.    Historical Provider, MD  OVER THE COUNTER MEDICATION Take 1 tablet by mouth daily. cholestoff-over the counter supplement    Historical Provider, MD   BP 138/87 mmHg  Pulse 57  Resp 16  Wt 178 lb (80.74 kg)  SpO2 98% Physical Exam  Constitutional: He is oriented to person, place, and time. He appears well-developed and well-nourished.  HENT:  Head: Normocephalic.  Eyes: EOM are normal.  Neck: Normal range of motion.  Pulmonary/Chest: Effort normal.  Tender left ribs, no pain with inspitation  Abdominal: He exhibits no distension.  Musculoskeletal: Normal range of motion.  Neurological: He is alert and oriented to person, place, and time.  Psychiatric: He has a normal mood and affect.  Nursing note and vitals reviewed.   ED Course  Procedures  (including critical care time) Labs Review Labs Reviewed - No data to display  Imaging Review Dg Ribs Unilateral W/chest Left  01/17/2015   CLINICAL DATA:  Slipped and fell hitting left lower posterior ribs 2-3 days ago.  EXAM: LEFT RIBS AND CHEST - 3+ VIEW  COMPARISON:  None.  FINDINGS: Lungs are adequately inflated and otherwise clear. Cardiomediastinal silhouette is within normal. There are mild degenerative changes of the spine. No evidence of acute rib fracture.  IMPRESSION: No acute findings.   Electronically Signed   By: Marin Olp M.D.   On: 01/17/2015 13:28     MDM   1. Left-sided chest wall pain   2. Contusion of left chest wall, initial encounter    Hydrocodone Ibuprofen  See your Physicain for recheck in 1 week. AVS    Fransico Meadow, PA-C 01/20/15 [phone]

## 2015-01-17 NOTE — Progress Notes (Signed)
OFFICE NOTE  01/17/2015  CC:  Chief Complaint  Patient presents with  . Fall  . Flank Pain    Left sided.    HPI: Patient is a 54 y.o. Caucasian male who is here for hurting in left side. Ten days ago he hit a hard steel bar while working ---slipped while standing and holding something with arm, feet went from under him and his left side slammed into a steel bar.  Took ibuprofen x 3-4 and didn't do much work during this time. 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  This happened again on a bit of a more vigorous hike 2 days ago.  Ribs hurting more yesterday.  Feeling a bit better today.  Pertinent PMH:  Past medical, surgical, social, and family history reviewed and no changes are noted since last office visit.  MEDS:  Outpatient Prescriptions Prior to Visit  Medication Sig Dispense Refill  . ALLOPURINOL PO Take 150 mg by mouth.    Marland Kitchen aspirin 81 MG tablet Take 81 mg by mouth daily.    Marland Kitchen lisinopril (PRINIVIL,ZESTRIL) 5 MG tablet Take 1 tablet (5 mg total) by mouth daily. 90 tablet 1  . metFORMIN (GLUCOPHAGE) 500 MG tablet Take 1 tablet (500 mg total) by mouth 2 (two) times daily with a meal. 60 tablet 4  . Misc Natural Products (COMPLETE PROSTATE HEALTH PO) Take 1 tablet by mouth daily.    Marland Kitchen OVER THE COUNTER MEDICATION Take 1 tablet by mouth daily. cholestoff-over the counter supplement     No facility-administered medications prior to visit.    PE: Blood pressure 126/83, pulse 54, temperature 98.7 F (37.1 C), temperature source Temporal, resp. rate 18, height 5' 5.5" (1.664 m), weight 178 lb (80.74 kg), SpO2 97 %. Gen: Alert, well appearing.  Patient is oriented to person, place, time, and situation. CV: RRR, no m/r/g.   LUNGS: CTA bilat, nonlabored resps, good aeration in all lung fields. Normal chest expansion.   Faint bruising noted on L side just inferior to  ribs.  Mod TTP over lower couple of ribs in posterior axillary line--about a soft-ball sized area.  No crepitus or deformity.  LABS:  Lab Results  Component Value Date   WBC 8.8 11/28/2014   HGB 16.0 11/28/2014   HCT 47.7 11/28/2014   MCV 86.4 11/28/2014   PLT 199.0 11/28/2014     Chemistry      Component Value Date/Time   NA 142 12/05/2014 0902   K 5.4* 12/05/2014 0902   CL 108 12/05/2014 0902   CO2 32 12/05/2014 0902   BUN 12 12/05/2014 0902   CREATININE 0.98 12/05/2014 0902      Component Value Date/Time   CALCIUM 9.7 12/05/2014 0902   ALKPHOS 64 11/28/2014 0810   AST 16 11/28/2014 0810   ALT 20 11/28/2014 0810   BILITOT 0.6 11/28/2014 0810     Lab Results  Component Value Date   HGBA1C 5.9 12/05/2014   IMPRESSION AND PLAN:  Left sided chest wall contusion; pain re-exacerbated by relatively vigorous exercise/deep breathing while hiking recently.  Reassuring exam. Will check left sided rib x-ray today. Continue ibuprofen prn. Continue normal activities but wait another week or so before doing vigorous hiking like he did recently.  An After Visit Summary was printed and given to the patient.  FOLLOW UP: prn

## 2015-01-17 NOTE — Discharge Instructions (Signed)

## 2015-01-17 NOTE — Progress Notes (Signed)
Pre visit review using our clinic review tool, if applicable. No additional management support is needed unless otherwise documented below in the visit note. 

## 2015-01-17 NOTE — ED Notes (Signed)
Pt c/o slipping and falling while holding onto a handle, hitting his left side on a track of a machine.

## 2015-01-30 ENCOUNTER — Ambulatory Visit: Payer: 59 | Admitting: Nutrition

## 2015-03-09 ENCOUNTER — Other Ambulatory Visit: Payer: Self-pay | Admitting: Family Medicine

## 2015-03-09 NOTE — Telephone Encounter (Signed)
RF request for Metformin.  LOV: 01/17/15 Next ov: 04/06/15 Last written: 09/06/14 #60 w/ 4RF

## 2015-04-06 ENCOUNTER — Encounter: Payer: Self-pay | Admitting: Family Medicine

## 2015-04-06 ENCOUNTER — Ambulatory Visit (INDEPENDENT_AMBULATORY_CARE_PROVIDER_SITE_OTHER): Payer: 59 | Admitting: Family Medicine

## 2015-04-06 VITALS — BP 129/81 | HR 59 | Temp 98.1°F | Resp 16 | Ht 65.5 in | Wt 182.0 lb

## 2015-04-06 DIAGNOSIS — E119 Type 2 diabetes mellitus without complications: Secondary | ICD-10-CM | POA: Diagnosis not present

## 2015-04-06 DIAGNOSIS — K429 Umbilical hernia without obstruction or gangrene: Secondary | ICD-10-CM

## 2015-04-06 DIAGNOSIS — Z8639 Personal history of other endocrine, nutritional and metabolic disease: Secondary | ICD-10-CM | POA: Diagnosis not present

## 2015-04-06 DIAGNOSIS — Z23 Encounter for immunization: Secondary | ICD-10-CM

## 2015-04-06 DIAGNOSIS — I10 Essential (primary) hypertension: Secondary | ICD-10-CM

## 2015-04-06 LAB — BASIC METABOLIC PANEL
BUN: 19 mg/dL (ref 6–23)
CO2: 29 mEq/L (ref 19–32)
CREATININE: 0.94 mg/dL (ref 0.40–1.50)
Calcium: 9.3 mg/dL (ref 8.4–10.5)
Chloride: 109 mEq/L (ref 96–112)
GFR: 88.7 mL/min (ref >60.00)
Glucose, Bld: 126 mg/dL — ABNORMAL HIGH (ref 70–99)
Potassium: 4.9 mEq/L (ref 3.5–5.1)
SODIUM: 144 meq/L (ref 135–145)

## 2015-04-06 LAB — HEMOGLOBIN A1C: Hgb A1c MFr Bld: 5.7 % (ref 4.6–6.5)

## 2015-04-06 NOTE — Progress Notes (Signed)
OFFICE VISIT  04/06/2015   CC:  Chief Complaint  Patient presents with  . Follow-up    4 month f/u. Pt is fasting.   HPI:    Patient is a 54 y.o. Caucasian male who presents for 4 mo f/u DM 2, HTN, hx of hyperkalemia (on ACE-I low dose). Still working pretty hard on TLC. Not checking sugars.  Youngest son just joined the [employer]. 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  Feet: no tingling, burning, or numbness.  Home bp monitoring: normal.  Asks about his umbillical hernia, can he get it repaired? No pain.  He can reduce it.      Past Medical History  Diagnosis Date  . Asthma     "grew out of it", esp after quitting smoking arond age 63 yrs old  . Blood in stool   . Hypertension   . Hyperlipidemia     med x 1 trial=adverse side effects so he stopped it.  LDL 130s 08/2014, then dx'd with DM 2 shortly after, so he needs to have another statin trial at f/u spring 2016.  Marland Kitchen Gout     Knee and toe--allopurinol since approx 2012  . Atypical chest pain 06/12/2014  . Diabetes mellitus without complication   . Sleep apnea     has cpap but dosnt use much   . Hyperkalemia 2015/2016    low K diet; cut lisinopril from 10mg  to 5mg  qd 11/2014.    Past Surgical History  Procedure Laterality Date  . Vasectomy    . Wisdom tooth extraction    . Colonoscopy w/ polypectomy  10/13/14    Hyperplastic; recall 10 yrs    Outpatient Prescriptions Prior to Visit  Medication Sig Dispense Refill  . ALLOPURINOL PO Take 150 mg by mouth.    Marland Kitchen aspirin 81 MG tablet Take 81 mg by mouth daily.    Marland Kitchen HYDROcodone-acetaminophen (NORCO/VICODIN) 5-325 MG per tablet Take 2 tablets by mouth every 4 (four) hours as needed. 10 tablet 0  . lisinopril (PRINIVIL,ZESTRIL) 5 MG tablet Take 1 tablet (5 mg total) by mouth daily. 90 tablet 1  . metFORMIN (GLUCOPHAGE) 500 MG tablet TAKE ONE TABLET BY MOUTH TWICE DAILY WITH  A  MEAL 60 tablet 3  . Misc Natural Products (COMPLETE PROSTATE HEALTH PO) Take 1 tablet by mouth daily.     Marland Kitchen OVER THE COUNTER MEDICATION Take 1 tablet by mouth daily. cholestoff-over the counter supplement    . ibuprofen (ADVIL,MOTRIN) 800 MG tablet Take 1 tablet (800 mg total) by mouth 3 (three) times daily. (Patient not taking: Reported on 04/06/2015) 21 tablet 0   No facility-administered medications prior to visit.    No Known Allergies  ROS As per HPI  PE: Blood pressure 129/81, pulse 59, temperature 98.1 F (36.7 C), temperature source Oral, resp. rate 16, height 5' 5.5" (1.664 m), weight 182 lb (82.555 kg), SpO2 99 %. Gen: Alert, well appearing.  Patient is oriented to person, place, time, and situation. Foot exam - both normal; no swelling, tenderness or skin or vascular lesions. Color and temperature is normal. Sensation is intact. Peripheral pulses are palpable. Toenails are normal.  ABD: soft, NT/ND.  Nickel-sized umbillical hernia, easily reducible, nontender.    LABS:  Lab Results  Component Value Date   TSH 1.30 11/28/2014   Lab Results  Component Value Date   WBC 8.8 11/28/2014   HGB 16.0 11/28/2014   HCT 47.7 11/28/2014   MCV 86.4 11/28/2014   PLT 199.0 11/28/2014  Lab Results  Component Value Date   CREATININE 0.98 12/05/2014   BUN 12 12/05/2014   NA 142 12/05/2014   K 5.4* 12/05/2014   CL 108 12/05/2014   CO2 32 12/05/2014   Lab Results  Component Value Date   ALT 20 11/28/2014   AST 16 11/28/2014   ALKPHOS 64 11/28/2014   BILITOT 0.6 11/28/2014   Lab Results  Component Value Date   CHOL 158 11/28/2014   Lab Results  Component Value Date   HDL 44.00 11/28/2014   Lab Results  Component Value Date   LDLCALC 92 11/28/2014   Lab Results  Component Value Date   TRIG 111.0 11/28/2014   Lab Results  Component Value Date   CHOLHDL 4 11/28/2014   Lab Results  Component Value Date   PSA 1.56 06/01/2014   Lab Results  Component Value Date   HGBA1C 5.9 12/05/2014    IMPRESSION AND PLAN:  1) DM 2; hx of great control. HbA1c  today. Feet exam normal today. Eye exam UTD (about 3-4 mo ago).  2) Hx of hyperkalemia: on low dose ACE-I.  Recheck BMET today. Low K diet.  3) HTN: The current medical regimen is effective;  continue present plan and medications. Lytes/cr today.  4) Umbillical hernia; small and reducible, asymptomatic.  I recommended watchful waiting at this time instead of referral to surgery. He understood and was in agreement with plan.  An After Visit Summary was printed and given to the patient.  FOLLOW UP: Return in about 4 months (around 08/06/2015) for annual CPE (fasting).

## 2015-04-06 NOTE — Progress Notes (Signed)
Pre visit review using our clinic review tool, if applicable. No additional management support is needed unless otherwise documented below in the visit note. 

## 2015-04-09 ENCOUNTER — Encounter: Payer: Self-pay | Admitting: Family Medicine

## 2015-04-21 NOTE — Addendum Note (Signed)
Addended by: Lanae Crumbly on: 04/21/2015 11:33 AM   Modules accepted: Orders

## 2015-04-24 ENCOUNTER — Encounter: Payer: Self-pay | Admitting: Family Medicine

## 2015-06-19 ENCOUNTER — Other Ambulatory Visit: Payer: Self-pay | Admitting: Family Medicine

## 2015-06-19 MED ORDER — LISINOPRIL 5 MG PO TABS: 5.0000 mg | ORAL_TABLET | Freq: Every day | ORAL | Status: DC

## 2015-08-08 ENCOUNTER — Ambulatory Visit (INDEPENDENT_AMBULATORY_CARE_PROVIDER_SITE_OTHER)
Admission: RE | Admit: 2015-08-08 | Discharge: 2015-08-08 | Disposition: A | Discharge status: Home / Self Care | Payer: 59 | Source: Ambulatory Visit | Attending: Family Medicine | Admitting: Family Medicine

## 2015-08-08 ENCOUNTER — Ambulatory Visit (INDEPENDENT_AMBULATORY_CARE_PROVIDER_SITE_OTHER): Payer: 59 | Admitting: Family Medicine

## 2015-08-08 ENCOUNTER — Encounter: Payer: Self-pay | Admitting: Family Medicine

## 2015-08-08 VITALS — BP 140/91 | HR 53 | Temp 98.2°F | Resp 16 | Ht 64.5 in | Wt 186.8 lb

## 2015-08-08 DIAGNOSIS — R209 Unspecified disturbances of skin sensation: Secondary | ICD-10-CM

## 2015-08-08 DIAGNOSIS — Z125 Encounter for screening for malignant neoplasm of prostate: Secondary | ICD-10-CM | POA: Diagnosis not present

## 2015-08-08 DIAGNOSIS — IMO0001 Reserved for inherently not codable concepts without codable children: Secondary | ICD-10-CM

## 2015-08-08 DIAGNOSIS — E119 Type 2 diabetes mellitus without complications: Secondary | ICD-10-CM | POA: Diagnosis not present

## 2015-08-08 DIAGNOSIS — Z0000 Encounter for general adult medical examination without abnormal findings: Secondary | ICD-10-CM | POA: Diagnosis not present

## 2015-08-08 LAB — COMPREHENSIVE METABOLIC PANEL
ALT: 32 U/L (ref 0–53)
AST: 21 U/L (ref 0–37)
Albumin: 4.4 g/dL (ref 3.5–5.2)
Alkaline Phosphatase: 62 U/L (ref 39–117)
BUN: 17 mg/dL (ref 6–23)
CO2: 33 meq/L — AB (ref 19–32)
Calcium: 9.7 mg/dL (ref 8.4–10.5)
Chloride: 106 mEq/L (ref 96–112)
Creatinine, Ser: 1.05 mg/dL (ref 0.40–1.50)
GFR: 77.97 mL/min (ref >60.00)
Glucose, Bld: 147 mg/dL — ABNORMAL HIGH (ref 70–99)
Potassium: 5.3 mEq/L — ABNORMAL HIGH (ref 3.5–5.1)
SODIUM: 144 meq/L (ref 135–145)
Total Bilirubin: 0.6 mg/dL (ref 0.2–1.2)
Total Protein: 6.6 g/dL (ref 6.0–8.3)

## 2015-08-08 LAB — CBC WITH DIFFERENTIAL/PLATELET
Basophils Absolute: 0.1 10*3/uL (ref 0.0–0.1)
Basophils Relative: 0.8 % (ref 0.0–3.0)
EOS PCT: 2 % (ref 0.0–5.0)
Eosinophils Absolute: 0.2 10*3/uL (ref 0.0–0.7)
HCT: 49.8 % (ref 39.0–52.0)
Hemoglobin: 16.5 g/dL (ref 13.0–17.0)
Lymphocytes Relative: 23 % (ref 12.0–46.0)
Lymphs Abs: 2.1 10*3/uL (ref 0.7–4.0)
MCHC: 33.1 g/dL (ref 30.0–36.0)
MCV: 88.7 fl (ref 78.0–100.0)
MONOS PCT: 5.5 % (ref 3.0–12.0)
Monocytes Absolute: 0.5 10*3/uL (ref 0.1–1.0)
NEUTROS ABS: 6.2 10*3/uL (ref 1.4–7.7)
NEUTROS PCT: 68.7 % (ref 43.0–77.0)
Platelets: 202 10*3/uL (ref 150.0–400.0)
RBC: 5.61 Mil/uL (ref 4.22–5.81)
RDW: 13.2 % (ref 11.5–15.5)
WBC: 9 10*3/uL (ref 4.0–10.5)

## 2015-08-08 LAB — LIPID PANEL
Cholesterol: 217 mg/dL — ABNORMAL HIGH (ref 0–200)
HDL: 56.7 mg/dL (ref >39.00)
LDL Cholesterol: 138 mg/dL — ABNORMAL HIGH (ref 0–99)
NonHDL: 160.37
Total CHOL/HDL Ratio: 4
Triglycerides: 112 mg/dL (ref 0.0–149.0)
VLDL: 22.4 mg/dL (ref 0.0–40.0)

## 2015-08-08 LAB — HEMOGLOBIN A1C: Hgb A1c MFr Bld: 6.1 % (ref 4.6–6.5)

## 2015-08-08 LAB — VITAMIN B12: Vitamin B-12: 515 pg/mL (ref 211–911)

## 2015-08-08 LAB — PSA: PSA: 1.08 ng/mL (ref 0.10–4.00)

## 2015-08-08 MED ORDER — ALLOPURINOL 300 MG PO TABS: 300.0000 mg | ORAL_TABLET | Freq: Every day | ORAL | Status: DC

## 2015-08-08 NOTE — Progress Notes (Signed)
Pre visit review using our clinic review tool, if applicable. No additional management support is needed unless otherwise documented below in the visit note. 

## 2015-08-08 NOTE — Progress Notes (Signed)
Office Note 08/08/2015  CC:  Chief Complaint  Patient presents with  . Annual Exam    Pt is fasting.     HPI:  Derrick Willis is a 54 y.o. White male who is here for annual health maintenance exam. Lots of work lately, not in the habit of exercising anymore.  No home bp monitoring or glucose monitoring to report.  Describes approx 6 months of the feeling of tingling down arms from the neck region, goes into fingers, says thumb and 1st three fingers on each hand feel numb in the tips continuously now.  No neck pain, but he says this did start after he fell off a piece of heavy equipment clumsily and held on/caught himself with left arm, jolted himself.   Has some milder, intermittent tingling/numbness feeling in toes but not as bad as fingers.    Past Medical History  Diagnosis Date  . Asthma     "grew out of it", esp after quitting smoking arond age 6 yrs old  . Blood in stool   . Hypertension   . Hyperlipidemia     med x 1 trial=adverse side effects so he stopped it.  LDL 130s 08/2014, then dx'd with DM 2 shortly after, so he needs to have another statin trial at f/u spring 2016.  Marland Kitchen Gout     Knee and toe--allopurinol since approx 2012  . Atypical chest pain 06/12/2014  . Diabetes mellitus without complication ([place])     No diab retinop as of 12/2014  . Sleep apnea     has cpap but dosnt use much   . Hyperkalemia 2015/2016    low K diet; cut lisinopril from 10mg  to 5mg  qd 11/2014.  Marland Kitchen Umbilical hernia [id number]    Past Surgical History  Procedure Laterality Date  . Vasectomy    . Wisdom tooth extraction    . Colonoscopy w/ polypectomy  10/13/14    Hyperplastic; recall 10 yrs    Family History  Problem Relation Age of Onset  . Arthritis Mother   . Arthritis Father   . Hyperlipidemia Father   . Hypertension Father   . Colon polyps Neg Hx   . Esophageal cancer Neg Hx   . Rectal cancer Neg Hx   . Stomach cancer Neg Hx     Social History   Social History  .  Marital Status: Legally Separated    Spouse Name: N/A  . Number of Children: N/A  . Years of Education: N/A   Occupational History  . Not on file.   Social History Main Topics  . Smoking status: Current Some Day Smoker    Types: Cigars    Last Attempt to Quit: 05/31/1990  . Smokeless tobacco: Never Used  . Alcohol Use: 0.6 - 1.2 oz/week    1-2 Glasses of wine per week     Comment: 3 x weekly  . Drug Use: No  . Sexual Activity: Not on file   Other Topics Concern  . Not on file   Social History Narrative   Divorced.  Two children, both young adults (one son plans to go to med school).   Occupation: Grading contractor-also a Psychologist, sport and exercise.   Education: 2 years of college.   Orig from [place], still lives there.   Tob 30 pack-yr hx, quit around age 32.   Social drinker.     No regular exercise.  Eating habits poor.    Outpatient Prescriptions Prior to Visit  Medication Sig Dispense Refill  .  aspirin 81 MG tablet Take 81 mg by mouth daily.    Marland Kitchen HYDROcodone-acetaminophen (NORCO/VICODIN) 5-325 MG per tablet Take 2 tablets by mouth every 4 (four) hours as needed. 10 tablet 0  . lisinopril (PRINIVIL,ZESTRIL) 5 MG tablet Take 1 tablet (5 mg total) by mouth daily. 90 tablet 1  . metFORMIN (GLUCOPHAGE) 500 MG tablet TAKE ONE TABLET BY MOUTH TWICE DAILY WITH  A  MEAL 60 tablet 3  . Misc Natural Products (COMPLETE PROSTATE HEALTH PO) Take 1 tablet by mouth daily.    Marland Kitchen OVER THE COUNTER MEDICATION Take 1 tablet by mouth daily. cholestoff-over the counter supplement    . ALLOPURINOL PO Take 150 mg by mouth.     No facility-administered medications prior to visit.    No Known Allergies  ROS Review of Systems  Constitutional: Negative for fever, chills, appetite change and fatigue.  HENT: Negative for congestion, dental problem, ear pain and sore throat.   Eyes: Negative for discharge, redness and visual disturbance.  Respiratory: Negative for cough, chest tightness, shortness of breath and  wheezing.   Cardiovascular: Negative for chest pain, palpitations and leg swelling.  Gastrointestinal: Negative for nausea, vomiting, abdominal pain, diarrhea and blood in stool.  Genitourinary: Negative for dysuria, urgency, frequency, hematuria, flank pain and difficulty urinating.  Musculoskeletal: Negative for myalgias, back pain, joint swelling, arthralgias and neck stiffness.  Skin: Negative for pallor and rash.  Neurological: Negative for dizziness, speech difficulty, weakness and headaches.  Hematological: Negative for adenopathy. Does not bruise/bleed easily.  Psychiatric/Behavioral: Negative for confusion and sleep disturbance. The patient is not nervous/anxious.     PE; Blood pressure 140/91, pulse 53, temperature 98.2 F (36.8 C), temperature source Oral, resp. rate 16, height 5' 4.5" (1.638 m), weight 186 lb 12 oz (84.709 kg), SpO2 98 %. Gen: Alert, well appearing.  Patient is oriented to person, place, time, and situation. AFFECT: pleasant, lucid thought and speech. ENT: Ears: EACs clear, normal epithelium.  TMs with good light reflex and landmarks bilaterally.  Eyes: no injection, icteris, swelling, or exudate.  EOMI, PERRLA. Nose: no drainage or turbinate edema/swelling.  No injection or focal lesion.  Mouth: lips without lesion/swelling.  Oral mucosa pink and moist.  Dentition intact and without obvious caries or gingival swelling.  Oropharynx without erythema, exudate, or swelling.  Neck: supple/nontender.  No LAD, mass, or TM.  Carotid pulses 2+ bilaterally, without bruits. CV: RRR, no m/r/g.   LUNGS: CTA bilat, nonlabored resps, good aeration in all lung fields. ABD: soft, NT, ND, BS normal.  No hepatospenomegaly or mass.  No bruits. EXT: no clubbing, cyanosis, or edema.  Musculoskeletal: no joint swelling, erythema, warmth, or tenderness.  ROM of all joints intact, including C spine.  C spine w/out tenderness.  Spurling's elicits slight pain at R base of C spine with head  turned to either side, with slight tingling sent down the arm on the side the head is turned towards, R side > L.   Hands: no muscle atrophy.  Phalen's testing negative.   Skin - no sores or suspicious lesions or rashes or color changes Rectal exam: negative without mass, lesions or tenderness, PROSTATE EXAM: smooth and symmetric without nodules or tenderness. Neuro: CN 2-12 intact bilaterally, strength 5/5 in proximal and distal upper extremities and lower extremities bilaterally.No tremor.   No ataxia.  Upper extremity and lower extremity DTRs symmetric.   Pertinent labs:  Lab Results  Component Value Date   TSH 1.30 11/28/2014   Lab Results  Component Value Date   WBC 8.8 11/28/2014   HGB 16.0 11/28/2014   HCT 47.7 11/28/2014   MCV 86.4 11/28/2014   PLT 199.0 11/28/2014   Lab Results  Component Value Date   CREATININE 0.94 04/06/2015   BUN 19 04/06/2015   NA 144 04/06/2015   K 4.9 04/06/2015   CL 109 04/06/2015   CO2 29 04/06/2015   Lab Results  Component Value Date   ALT 20 11/28/2014   AST 16 11/28/2014   ALKPHOS 64 11/28/2014   BILITOT 0.6 11/28/2014   Lab Results  Component Value Date   CHOL 158 11/28/2014   Lab Results  Component Value Date   HDL 44.00 11/28/2014   Lab Results  Component Value Date   LDLCALC 92 11/28/2014   Lab Results  Component Value Date   TRIG 111.0 11/28/2014   Lab Results  Component Value Date   CHOLHDL 4 11/28/2014   Lab Results  Component Value Date   PSA 1.56 06/01/2014   Lab Results  Component Value Date   HGBA1C 5.7 04/06/2015   ASSESSMENT AND PLAN:   1) Paresthesias in hands > arms>toes.   R/o B12 deficiency. Could be diabetic peripheral neuropathy.  However, UE sx's seem to be radicular in nature per pt's description, so C spine spinal nerve compression associated with spondylosis could also be occurring. Will check C spine plain films. Suspect pt will benefit from neurology consult--will wait for results from  testing today before making any referral.  2) Health maintenance exam: Reviewed age and gender appropriate health maintenance issues (prudent diet, regular exercise, health risks of tobacco and excessive alcohol, use of seatbelts, fire alarms in home, use of sunscreen).  Also reviewed age and gender appropriate health screening as well as vaccine recommendations. Pt declined flu vaccine. HP labs + PSA drawn.  DRE normal. He is UTD on colon ca screening. HbA1c also drawn today.  An After Visit Summary was printed and given to the patient.  FOLLOW UP:  Return in about 4 months (around 12/07/2015) for routine chronic illness f/u.

## 2015-08-09 NOTE — Progress Notes (Signed)
Pls tell pt that he needs to see a neurologist, not a neurosurgeon.  If he knows a neurologist that he wants me to refer him to then I will be glad to, otherwise I will refer to [facility] neurology.  -thx

## 2015-08-30 ENCOUNTER — Other Ambulatory Visit: Payer: Self-pay | Admitting: Family Medicine

## 2015-08-31 NOTE — Telephone Encounter (Signed)
RF request for metformin LOV: 08/08/15 Next ov: 12/07/15 Last written: 03/09/15 #60 w/ 3RF

## 2015-12-07 ENCOUNTER — Ambulatory Visit (INDEPENDENT_AMBULATORY_CARE_PROVIDER_SITE_OTHER): Payer: 59 | Admitting: Family Medicine

## 2015-12-07 ENCOUNTER — Encounter: Payer: Self-pay | Admitting: Family Medicine

## 2015-12-07 VITALS — BP 136/82 | HR 65 | Temp 98.2°F | Resp 16 | Ht 64.5 in | Wt 188.2 lb

## 2015-12-07 DIAGNOSIS — E875 Hyperkalemia: Secondary | ICD-10-CM

## 2015-12-07 DIAGNOSIS — I10 Essential (primary) hypertension: Secondary | ICD-10-CM | POA: Diagnosis not present

## 2015-12-07 DIAGNOSIS — E785 Hyperlipidemia, unspecified: Secondary | ICD-10-CM | POA: Diagnosis not present

## 2015-12-07 DIAGNOSIS — E119 Type 2 diabetes mellitus without complications: Secondary | ICD-10-CM

## 2015-12-07 LAB — BASIC METABOLIC PANEL
BUN: 25 mg/dL — ABNORMAL HIGH (ref 6–23)
CHLORIDE: 103 meq/L (ref 96–112)
CO2: 31 mEq/L (ref 19–32)
CREATININE: 1.05 mg/dL (ref 0.40–1.50)
Calcium: 9.9 mg/dL (ref 8.4–10.5)
GFR: 77.87 mL/min (ref >60.00)
Glucose, Bld: 147 mg/dL — ABNORMAL HIGH (ref 70–99)
Potassium: 5.1 mEq/L (ref 3.5–5.1)
Sodium: 139 mEq/L (ref 135–145)

## 2015-12-07 NOTE — Progress Notes (Signed)
Pre visit review using our clinic review tool, if applicable. No additional management support is needed unless otherwise documented below in the visit note. 

## 2015-12-07 NOTE — Progress Notes (Signed)
OFFICE VISIT  12/07/2015   CC:  Chief Complaint  Patient presents with  . Follow-up    Pt is fasting.     HPI:    Patient is a 55 y.o. Caucasian male who presents for 4 mo f/u DM 2, HTN, HLD. Not checking glucoses but taking meds. Checks bp's at home and they are consistently normal.  He has made some TLC's since last visit and we'll give these 4-6 mo longer before rechecking cholesterol panel. He quit smoking cigars 3-4 weeks ago.  He went to see neurosurgeon about his upper extremity paresthesias (Dr. Vertell Limber). Some recurring low back pain was discussed at that time as well.   He got L spine MRI which showed spinal stenosis and bulging disc--he got a L spine steroid injection. He got NCS on UE's and he has not gone back yet to discuss results.  Past Medical History  Diagnosis Date  . Asthma     "grew out of it", esp after quitting smoking arond age 89 yrs old  . Blood in stool   . Hypertension   . Hyperlipidemia     med x 1 trial=adverse side effects so he stopped it.  LDL 130s 08/2014, then dx'd with DM 2 shortly after, pt refused another trial of statin 07/2015.  Marland Kitchen Gout     Knee and toe--allopurinol since approx 2012 and has had no flares since being on this med (1/2 of 300 mg tab qd)  . Atypical chest pain 06/12/2014  . Diabetes mellitus without complication ([place])     No diab retinop as of 12/2014  . Sleep apnea     has cpap but dosnt use much   . Hyperkalemia 2015/2016    low K diet; cut lisinopril from 10mg  to 5mg  qd 11/2014.  Marland Kitchen Umbilical hernia [id number]    Past Surgical History  Procedure Laterality Date  . Vasectomy    . Wisdom tooth extraction    . Colonoscopy w/ polypectomy  10/13/14    Hyperplastic; recall 10 yrs    Outpatient Prescriptions Prior to Visit  Medication Sig Dispense Refill  . allopurinol (ZYLOPRIM) 300 MG tablet Take 1 tablet (300 mg total) by mouth daily. 30 tablet 12  . aspirin 81 MG tablet Take 81 mg by mouth daily.    Marland Kitchen lisinopril  (PRINIVIL,ZESTRIL) 5 MG tablet Take 1 tablet (5 mg total) by mouth daily. 90 tablet 1  . metFORMIN (GLUCOPHAGE) 500 MG tablet TAKE ONE TABLET BY MOUTH TWICE DAILY WITH MEALS 60 tablet 6  . Misc Natural Products (COMPLETE PROSTATE HEALTH PO) Take 1 tablet by mouth daily.    Marland Kitchen OVER THE COUNTER MEDICATION Take 1 tablet by mouth daily. cholestoff-over the counter supplement    . HYDROcodone-acetaminophen (NORCO/VICODIN) 5-325 MG per tablet Take 2 tablets by mouth every 4 (four) hours as needed. (Patient not taking: Reported on 12/07/2015) 10 tablet 0   No facility-administered medications prior to visit.    No Known Allergies  ROS As per HPI  PE: Blood pressure 136/82, pulse 65, temperature 98.2 F (36.8 C), temperature source Oral, resp. rate 16, height 5' 4.5" (1.638 m), weight 188 lb 4 oz (85.39 kg), SpO2 94 %. Gen: Alert, well appearing.  Patient is oriented to person, place, time, and situation. AFFECT: pleasant, lucid thought and speech. No further exam today  LABS:  Lab Results  Component Value Date   TSH 1.30 11/28/2014   Lab Results  Component Value Date   WBC 9.0 08/08/2015  HGB 16.5 08/08/2015   HCT 49.8 08/08/2015   MCV 88.7 08/08/2015   PLT 202.0 08/08/2015   Lab Results  Component Value Date   CREATININE 1.05 08/08/2015   BUN 17 08/08/2015   NA 144 08/08/2015   K 5.3* 08/08/2015   CL 106 08/08/2015   CO2 33* 08/08/2015   Lab Results  Component Value Date   ALT 32 08/08/2015   AST 21 08/08/2015   ALKPHOS 62 08/08/2015   BILITOT 0.6 08/08/2015   Lab Results  Component Value Date   CHOL 217* 08/08/2015   Lab Results  Component Value Date   HDL 56.70 08/08/2015   Lab Results  Component Value Date   LDLCALC 138* 08/08/2015   Lab Results  Component Value Date   TRIG 112.0 08/08/2015   Lab Results  Component Value Date   CHOLHDL 4 08/08/2015   Lab Results  Component Value Date   PSA 1.08 08/08/2015   PSA 1.56 06/01/2014   Lab Results   Component Value Date   HGBA1C 6.1 08/08/2015   IMPRESSION AND PLAN:  1) DM 2; historically well controlled. OK to defer repeat A1c check until NEXT f/u in 4 mo.  2) HTN; The current medical regimen is effective;  continue present plan and medications. Lytes/cr today  3) Hx of hyperkalemia: still borderline after decrease in ACE-I dose. Follow lytes/cr today.  4) HLD: pt declines statins.  We'll give his TLC another 78mo and recheck FLP at that time. If still up, then will recommend statin yet again.  5) UE paresthesias: ? CTS vs cervical radiculopathy:  Dr. Vertell Limber in Neurosurgery is working this up currently.  An After Visit Summary was printed and given to the patient.   FOLLOW UP: Return in about 4 months (around 04/07/2016) for routine chronic illness f/u.  Signed:  Crissie Sickles, MD           12/07/2015

## 2015-12-19 ENCOUNTER — Other Ambulatory Visit: Payer: Self-pay | Admitting: Family Medicine

## 2015-12-19 NOTE — Telephone Encounter (Signed)
RF request for lisinopril LOV: 12/07/15 Next ov: 04/05/16 Last written: 06/19/15 #90 w/ 1RF

## 2016-04-05 ENCOUNTER — Ambulatory Visit (INDEPENDENT_AMBULATORY_CARE_PROVIDER_SITE_OTHER): Payer: Self-pay | Admitting: Family Medicine

## 2016-04-05 ENCOUNTER — Encounter: Payer: Self-pay | Admitting: Family Medicine

## 2016-04-05 VITALS — BP 140/83 | HR 63 | Temp 98.1°F | Resp 16 | Ht 64.5 in | Wt 193.0 lb

## 2016-04-05 DIAGNOSIS — E119 Type 2 diabetes mellitus without complications: Secondary | ICD-10-CM

## 2016-04-05 DIAGNOSIS — I10 Essential (primary) hypertension: Secondary | ICD-10-CM

## 2016-04-05 DIAGNOSIS — Z8639 Personal history of other endocrine, nutritional and metabolic disease: Secondary | ICD-10-CM

## 2016-04-05 DIAGNOSIS — E785 Hyperlipidemia, unspecified: Secondary | ICD-10-CM

## 2016-04-05 LAB — BASIC METABOLIC PANEL
BUN: 18 mg/dL (ref 6–23)
CALCIUM: 9.8 mg/dL (ref 8.4–10.5)
CO2: 30 mEq/L (ref 19–32)
Chloride: 106 mEq/L (ref 96–112)
Creatinine, Ser: 1.13 mg/dL (ref 0.40–1.50)
GFR: 71.46 mL/min (ref >60.00)
Glucose, Bld: 163 mg/dL — ABNORMAL HIGH (ref 70–99)
Potassium: 4.9 mEq/L (ref 3.5–5.1)
Sodium: 141 mEq/L (ref 135–145)

## 2016-04-05 LAB — HEMOGLOBIN A1C: HEMOGLOBIN A1C: 6 % (ref 4.6–6.5)

## 2016-04-05 NOTE — Progress Notes (Signed)
OFFICE VISIT  04/05/2016   CC:  Chief Complaint  Patient presents with  . Follow-up    Pt is fasting.      HPI:    Patient is a 55 y.o. Caucasian male who presents for 4 mo f/u DM 2, HTN, hx of hyperkalemia. Home bp monitoring 120s/70s consistently. Home glucose monitoring: not checking but just got a glucometer.  He is not very compliant with diabetic diet. He wants to continue working on diet/exercise before rechecking cholesterol and considering another trial of statin. He is due for eye exam, has an eye MD in [place].  ROS: no CP, no vision c/o, no SOB, no HAs.   Past Medical History:  Diagnosis Date  . Asthma    "grew out of it", esp after quitting smoking arond age 19 yrs old  . Atypical chest pain 06/12/2014  . Blood in stool   . Carpal tunnel syndrome    bilat, L>>R (Dr. Vertell Limber)  . Diabetes mellitus without complication ([place])    No diab retinop as of 12/2014  . Gout    Knee and toe--allopurinol since approx 2012 and has had no flares since being on this med (1/2 of 300 mg tab qd)  . Hyperkalemia 2015/2016   low K diet; cut lisinopril from 10mg  to 5mg  qd 11/2014.  Marland Kitchen Hyperlipidemia    med x 1 trial=adverse side effects so he stopped it.  LDL 130s 08/2014, then dx'd with DM 2 shortly after, pt refused another trial of statin 07/2015.  Marland Kitchen Hypertension   . Sleep apnea    has cpap but dosnt use much   . Umbilical hernia [id number]    Past Surgical History:  Procedure Laterality Date  . COLONOSCOPY W/ POLYPECTOMY  10/13/14   Hyperplastic; recall 10 yrs  . VASECTOMY    . WISDOM TOOTH EXTRACTION      Outpatient Medications Prior to Visit  Medication Sig Dispense Refill  . allopurinol (ZYLOPRIM) 300 MG tablet Take 1 tablet (300 mg total) by mouth daily. 30 tablet 12  . aspirin 81 MG tablet Take 81 mg by mouth daily.    Marland Kitchen lisinopril (PRINIVIL,ZESTRIL) 5 MG tablet TAKE ONE TABLET BY MOUTH ONCE DAILY 90 tablet 3  . metFORMIN (GLUCOPHAGE) 500 MG tablet TAKE ONE TABLET BY MOUTH  TWICE DAILY WITH MEALS 60 tablet 6  . Misc Natural Products (COMPLETE PROSTATE HEALTH PO) Take 1 tablet by mouth daily.    Marland Kitchen OVER THE COUNTER MEDICATION Take 1 tablet by mouth daily. cholestoff-over the counter supplement     No facility-administered medications prior to visit.     No Known Allergies  ROS As per HPI  PE: Blood pressure 140/83, pulse 63, temperature 98.1 F (36.7 C), temperature source Oral, resp. rate 16, height 5' 4.5" (1.638 m), weight 193 lb (87.5 kg), SpO2 96 %. Gen: Alert, well appearing.  Patient is oriented to person, place, time, and situation. AFFECT: pleasant, lucid thought and speech. CV: RRR, no m/r/g.   LUNGS: CTA bilat, nonlabored resps, good aeration in all lung fields. EXT: no clubbing, cyanosis, or edema.    LABS:  Lab Results  Component Value Date   TSH 1.30 11/28/2014   Lab Results  Component Value Date   WBC 9.0 08/08/2015   HGB 16.5 08/08/2015   HCT 49.8 08/08/2015   MCV 88.7 08/08/2015   PLT 202.0 08/08/2015   Lab Results  Component Value Date   CREATININE 1.05 12/07/2015   BUN 25 (H) 12/07/2015  NA 139 12/07/2015   K 5.1 12/07/2015   CL 103 12/07/2015   CO2 31 12/07/2015   Lab Results  Component Value Date   ALT 32 08/08/2015   AST 21 08/08/2015   ALKPHOS 62 08/08/2015   BILITOT 0.6 08/08/2015   Lab Results  Component Value Date   CHOL 217 (H) 08/08/2015   Lab Results  Component Value Date   HDL 56.70 08/08/2015   Lab Results  Component Value Date   LDLCALC 138 (H) 08/08/2015   Lab Results  Component Value Date   TRIG 112.0 08/08/2015   Lab Results  Component Value Date   CHOLHDL 4 08/08/2015   Lab Results  Component Value Date   PSA 1.08 08/08/2015   PSA 1.56 06/01/2014   Lab Results  Component Value Date   HGBA1C 6.1 08/08/2015    IMPRESSION AND PLAN:  1) DM 2: hx of good control.  Needs to do better with diet/exercise. HbA1c today. He was reminded today to get updated eye screening with  his ophthalmologist.  2) HTN:The current medical regimen is effective;  continue present plan and medications. Lytes/cr today.  3) Hyperlipidemia: pt wants to continue to work on [place] before rechecking cholesterol panel. Has hx of statin intolerance and he is not to keen on another trial of med, but if he agrees to another trial in the future we'll try pravastatin.  4) Hx of hyperkalemia: got better with low K diet and lowering dose of his ACE-I. Recheck K today.  An After Visit Summary was printed and given to the patient.  FOLLOW UP: Return in about 4 months (around 08/05/2016) for annual CPE (fasting).  Signed:  Crissie Sickles, MD           04/05/2016

## 2016-04-05 NOTE — Progress Notes (Signed)
Pre visit review using our clinic review tool, if applicable. No additional management support is needed unless otherwise documented below in the visit note. 

## 2016-08-08 ENCOUNTER — Encounter: Payer: Self-pay | Admitting: Family Medicine

## 2016-08-09 ENCOUNTER — Encounter: Payer: Self-pay | Admitting: Family Medicine

## 2016-08-09 ENCOUNTER — Ambulatory Visit (INDEPENDENT_AMBULATORY_CARE_PROVIDER_SITE_OTHER): Payer: Self-pay | Admitting: Family Medicine

## 2016-08-09 VITALS — BP 145/74 | HR 62 | Temp 98.2°F | Resp 16 | Ht 64.5 in | Wt 197.0 lb

## 2016-08-09 DIAGNOSIS — I10 Essential (primary) hypertension: Secondary | ICD-10-CM

## 2016-08-09 MED ORDER — HYDROCHLOROTHIAZIDE 25 MG PO TABS: 25.0000 mg | ORAL_TABLET | Freq: Every day | ORAL | 3 refills | Status: DC

## 2016-08-09 NOTE — Progress Notes (Signed)
OFFICE VISIT  08/09/2016   CC:  Chief Complaint  Patient presents with  . Hypertension    Pt is not fasting.    HPI:    Patient is a 55 y.o.  male who presents for elevated bp's, has hx of HTN. 140-150+ over 70s-80s.  Not eating right, not exercising, has chronic back pain last 6 mo. Denies HA, CP, dizziness, SOB, or LE swelling.   Has DMV physical coming up in early January 2018.   Past Medical History:  Diagnosis Date  . Asthma    "grew out of it", esp after quitting smoking arond age 53 yrs old  . Atypical chest pain 06/12/2014  . Blood in stool   . Carpal tunnel syndrome    bilat, L>>R (Dr. Vertell Limber)  . Diabetes mellitus without complication ([place])    No diab retinop as of 12/2014  . Gout    Knee and toe--allopurinol since approx 2012 and has had no flares since being on this med (1/2 of 300 mg tab qd)  . Hyperkalemia 2015/2016   low K diet; cut lisinopril from 10mg  to 5mg  qd 11/2014.  Marland Kitchen Hyperlipidemia    med x 1 trial=adverse side effects so he stopped it.  LDL 130s 08/2014, then dx'd with DM 2 shortly after, pt refused another trial of statin 07/2015.  Marland Kitchen Hypertension   . Sleep apnea    has cpap but dosnt use much   . Umbilical hernia [id number]    Past Surgical History:  Procedure Laterality Date  . COLONOSCOPY W/ POLYPECTOMY  10/13/14   Hyperplastic; recall 10 yrs  . VASECTOMY    . WISDOM TOOTH EXTRACTION      Outpatient Medications Prior to Visit  Medication Sig Dispense Refill  . allopurinol (ZYLOPRIM) 300 MG tablet Take 1 tablet (300 mg total) by mouth daily. 30 tablet 12  . aspirin 81 MG tablet Take 81 mg by mouth daily.    Marland Kitchen lisinopril (PRINIVIL,ZESTRIL) 5 MG tablet TAKE ONE TABLET BY MOUTH ONCE DAILY 90 tablet 3  . metFORMIN (GLUCOPHAGE) 500 MG tablet TAKE ONE TABLET BY MOUTH TWICE DAILY WITH MEALS 60 tablet 6  . Misc Natural Products (COMPLETE PROSTATE HEALTH PO) Take 1 tablet by mouth daily.    . Multiple Vitamin (MULTIVITAMIN) tablet Take 1 tablet by mouth  daily.    Marland Kitchen OVER THE COUNTER MEDICATION Take 1 tablet by mouth daily. cholestoff-over the counter supplement     No facility-administered medications prior to visit.     No Known Allergies  ROS As per HPI  PE: Blood pressure (!) 145/74, pulse 62, temperature 98.2 F (36.8 C), temperature source Oral, resp. rate 16, height 5' 4.5" (1.638 m), weight 197 lb (89.4 kg), SpO2 96 %. Gen: Alert, well appearing.  Patient is oriented to person, place, time, and situation. AFFECT: pleasant, lucid thought and speech. CV: RRR, no m/r/g.   LUNGS: CTA bilat, nonlabored resps, good aeration in all lung fields. EXT: no clubbing, cyanosis, or edema.   LABS:    Chemistry      Component Value Date/Time   NA 141 04/05/2016 0822   K 4.9 04/05/2016 0822   CL 106 04/05/2016 0822   CO2 30 04/05/2016 0822   BUN 18 04/05/2016 0822   CREATININE 1.13 04/05/2016 0822      Component Value Date/Time   CALCIUM 9.8 04/05/2016 0822   ALKPHOS 62 08/08/2015 0850   AST 21 08/08/2015 0850   ALT 32 08/08/2015 0850   BILITOT 0.6  08/08/2015 0850     IMPRESSION AND PLAN:  Uncontrolled hypertension; isolated systolic HTN, stage I. Must keep lisinopril at 5mg  qd dosing due to his hx of hyperkalemia on higher doses. Will add hctz 25mg  qd.  Hopefully this will help push his K down as well as help his BP. Therapeutic expectations and side effect profile of medication discussed today.  Patient's questions answered.  An After Visit Summary was printed and given to the patient.  FOLLOW UP: Return in about 10 days (around 08/19/2016) for f/u HTN/check BMET.  Signed:  Crissie Sickles, MD           08/09/2016

## 2016-08-09 NOTE — Progress Notes (Signed)
Pre visit review using our clinic review tool, if applicable. No additional management support is needed unless otherwise documented below in the visit note. 

## 2016-08-20 ENCOUNTER — Encounter: Payer: Self-pay | Admitting: Family Medicine

## 2016-08-20 ENCOUNTER — Ambulatory Visit (INDEPENDENT_AMBULATORY_CARE_PROVIDER_SITE_OTHER): Payer: Self-pay | Admitting: Family Medicine

## 2016-08-20 VITALS — BP 123/79 | HR 70 | Temp 98.3°F | Resp 16 | Ht 64.5 in | Wt 195.5 lb

## 2016-08-20 DIAGNOSIS — Z8639 Personal history of other endocrine, nutritional and metabolic disease: Secondary | ICD-10-CM

## 2016-08-20 DIAGNOSIS — I10 Essential (primary) hypertension: Secondary | ICD-10-CM

## 2016-08-20 NOTE — Progress Notes (Signed)
Pre visit review using our clinic review tool, if applicable. No additional management support is needed unless otherwise documented below in the visit note. 

## 2016-08-20 NOTE — Progress Notes (Signed)
OFFICE VISIT  08/20/2016   CC:  Chief Complaint  Patient presents with  . Follow-up    HTN and bmet, pt is not fasting.    HPI:    Patient is a 55 y.o. Caucasian male who presents for 10 d f/u HTN. Added hctz 25mg  qd last visit.   Home bp's 120s/70s.  Still taking lisinopril 5mg  qd as well. Some increased urination side effect noted but this is calming down some.  Past Medical History:  Diagnosis Date  . Asthma    "grew out of it", esp after quitting smoking arond age 4 yrs old  . Atypical chest pain 06/12/2014  . Blood in stool   . Carpal tunnel syndrome    bilat, L>>R (Dr. Vertell Limber)  . Diabetes mellitus without complication ([place])    No diab retinop as of 12/2014  . Gout    Knee and toe--allopurinol since approx 2012 and has had no flares since being on this med (1/2 of 300 mg tab qd)  . Hyperkalemia 2015/2016   low K diet; cut lisinopril from 10mg  to 5mg  qd 11/2014.  Marland Kitchen Hyperlipidemia    med x 1 trial=adverse side effects so he stopped it.  LDL 130s 08/2014, then dx'd with DM 2 shortly after, pt refused another trial of statin 07/2015.  Marland Kitchen Hypertension   . Sleep apnea    has cpap but dosnt use much   . Umbilical hernia [id number]    Past Surgical History:  Procedure Laterality Date  . COLONOSCOPY W/ POLYPECTOMY  10/13/14   Hyperplastic; recall 10 yrs  . VASECTOMY    . WISDOM TOOTH EXTRACTION      Outpatient Medications Prior to Visit  Medication Sig Dispense Refill  . allopurinol (ZYLOPRIM) 300 MG tablet Take 1 tablet (300 mg total) by mouth daily. 30 tablet 12  . aspirin 81 MG tablet Take 81 mg by mouth daily.    . hydrochlorothiazide (HYDRODIURIL) 25 MG tablet Take 1 tablet (25 mg total) by mouth daily. 30 tablet 3  . lisinopril (PRINIVIL,ZESTRIL) 5 MG tablet TAKE ONE TABLET BY MOUTH ONCE DAILY 90 tablet 3  . metFORMIN (GLUCOPHAGE) 500 MG tablet TAKE ONE TABLET BY MOUTH TWICE DAILY WITH MEALS 60 tablet 6  . Misc Natural Products (COMPLETE PROSTATE HEALTH PO) Take 1 tablet  by mouth daily.    . Multiple Vitamin (MULTIVITAMIN) tablet Take 1 tablet by mouth daily.    Marland Kitchen OVER THE COUNTER MEDICATION Take 1 tablet by mouth daily. cholestoff-over the counter supplement     No facility-administered medications prior to visit.     No Known Allergies  ROS As per HPI  PE: Blood pressure 123/79, pulse 70, temperature 98.3 F (36.8 C), temperature source Oral, resp. rate 16, height 5' 4.5" (1.638 m), weight 195 lb 8 oz (88.7 kg), SpO2 97 %. Gen: Alert, well appearing.  Patient is oriented to person, place, time, and situation. CV: RRR, no m/r/g.   LUNGS: CTA bilat, nonlabored resps, good aeration in all lung fields. EXT: no clubbing, cyanosis, or edema.    LABS:    Chemistry      Component Value Date/Time   NA 141 04/05/2016 0822   K 4.9 04/05/2016 0822   CL 106 04/05/2016 0822   CO2 30 04/05/2016 0822   BUN 18 04/05/2016 0822   CREATININE 1.13 04/05/2016 0822      Component Value Date/Time   CALCIUM 9.8 04/05/2016 0822   ALKPHOS 62 08/08/2015 0850   AST 21 08/08/2015  0850   ALT 32 08/08/2015 0850   BILITOT 0.6 08/08/2015 0850       IMPRESSION AND PLAN:  HTN, well controlled: The current medical regimen is effective;  continue present plan and medications. He is self pay and would like to defer BMET check until after his insurance kicks in 08/26/16. We'll have this ordered for when he comes in to lab.  An After Visit Summary was printed and given to the patient.  FOLLOW UP: Return for lab appt after 08/26/16 .  Signed:  Crissie Sickles, MD           08/20/2016

## 2016-08-27 ENCOUNTER — Other Ambulatory Visit (INDEPENDENT_AMBULATORY_CARE_PROVIDER_SITE_OTHER): Payer: BLUE CROSS/BLUE SHIELD

## 2016-08-27 DIAGNOSIS — I10 Essential (primary) hypertension: Secondary | ICD-10-CM | POA: Diagnosis not present

## 2016-08-27 DIAGNOSIS — Z8639 Personal history of other endocrine, nutritional and metabolic disease: Secondary | ICD-10-CM

## 2016-08-27 LAB — BASIC METABOLIC PANEL
BUN: 26 mg/dL — AB (ref 6–23)
CHLORIDE: 98 meq/L (ref 96–112)
CO2: 33 mEq/L — ABNORMAL HIGH (ref 19–32)
CREATININE: 1.1 mg/dL (ref 0.40–1.50)
Calcium: 9.7 mg/dL (ref 8.4–10.5)
GFR: 73.61 mL/min (ref >60.00)
Glucose, Bld: 245 mg/dL — ABNORMAL HIGH (ref 70–99)
Potassium: 4.9 mEq/L (ref 3.5–5.1)
Sodium: 137 mEq/L (ref 135–145)

## 2016-09-11 ENCOUNTER — Encounter: Payer: Self-pay | Admitting: Family Medicine

## 2016-09-11 ENCOUNTER — Other Ambulatory Visit: Payer: Self-pay | Admitting: Family Medicine

## 2016-09-13 NOTE — Telephone Encounter (Signed)
RF request for allopurinol LOV: 04/05/16 Next ov: 09/26/16 Last written: 08/08/15 #30 w/ 12RF  Please advise. Thanks.   ____________________ Rx below has been sent.  RF request for metformin Last written: 08/31/15 #60 w/ 6RF

## 2016-09-26 ENCOUNTER — Encounter: Payer: Self-pay | Admitting: Family Medicine

## 2016-09-26 ENCOUNTER — Ambulatory Visit (INDEPENDENT_AMBULATORY_CARE_PROVIDER_SITE_OTHER): Payer: BLUE CROSS/BLUE SHIELD | Admitting: Family Medicine

## 2016-09-26 VITALS — BP 140/80 | HR 60 | Temp 98.2°F | Resp 16 | Ht 64.5 in | Wt 193.0 lb

## 2016-09-26 DIAGNOSIS — Z0000 Encounter for general adult medical examination without abnormal findings: Secondary | ICD-10-CM

## 2016-09-26 DIAGNOSIS — E118 Type 2 diabetes mellitus with unspecified complications: Secondary | ICD-10-CM | POA: Diagnosis not present

## 2016-09-26 DIAGNOSIS — Z125 Encounter for screening for malignant neoplasm of prostate: Secondary | ICD-10-CM | POA: Diagnosis not present

## 2016-09-26 LAB — COMPREHENSIVE METABOLIC PANEL
ALBUMIN: 4.5 g/dL (ref 3.5–5.2)
ALK PHOS: 73 U/L (ref 39–117)
ALT: 43 U/L (ref 0–53)
AST: 20 U/L (ref 0–37)
BUN: 17 mg/dL (ref 6–23)
CALCIUM: 10.1 mg/dL (ref 8.4–10.5)
CO2: 34 mEq/L — ABNORMAL HIGH (ref 19–32)
CREATININE: 1.03 mg/dL (ref 0.40–1.50)
Chloride: 103 mEq/L (ref 96–112)
GFR: 79.39 mL/min (ref >60.00)
Glucose, Bld: 166 mg/dL — ABNORMAL HIGH (ref 70–99)
POTASSIUM: 4.8 meq/L (ref 3.5–5.1)
SODIUM: 141 meq/L (ref 135–145)
TOTAL PROTEIN: 6.7 g/dL (ref 6.0–8.3)
Total Bilirubin: 0.5 mg/dL (ref 0.2–1.2)

## 2016-09-26 LAB — CBC WITH DIFFERENTIAL/PLATELET
BASOS ABS: 0.1 10*3/uL (ref 0.0–0.1)
Basophils Relative: 0.9 % (ref 0.0–3.0)
EOS ABS: 0.2 10*3/uL (ref 0.0–0.7)
Eosinophils Relative: 2.2 % (ref 0.0–5.0)
HEMATOCRIT: 49.1 % (ref 39.0–52.0)
HEMOGLOBIN: 16.7 g/dL (ref 13.0–17.0)
LYMPHS PCT: 19.7 % (ref 12.0–46.0)
Lymphs Abs: 2.2 10*3/uL (ref 0.7–4.0)
MCHC: 34 g/dL (ref 30.0–36.0)
MCV: 88.7 fl (ref 78.0–100.0)
MONOS PCT: 5.6 % (ref 3.0–12.0)
Monocytes Absolute: 0.6 10*3/uL (ref 0.1–1.0)
NEUTROS ABS: 7.9 10*3/uL — AB (ref 1.4–7.7)
Neutrophils Relative %: 71.6 % (ref 43.0–77.0)
PLATELETS: 234 10*3/uL (ref 150.0–400.0)
RBC: 5.54 Mil/uL (ref 4.22–5.81)
RDW: 13.4 % (ref 11.5–15.5)
WBC: 11 10*3/uL — AB (ref 4.0–10.5)

## 2016-09-26 LAB — LIPID PANEL
Cholesterol: 207 mg/dL — ABNORMAL HIGH (ref 0–200)
HDL: 53.5 mg/dL (ref >39.00)
LDL Cholesterol: 119 mg/dL — ABNORMAL HIGH (ref 0–99)
NonHDL: 153.8
TRIGLYCERIDES: 172 mg/dL — AB (ref 0.0–149.0)
Total CHOL/HDL Ratio: 4
VLDL: 34.4 mg/dL (ref 0.0–40.0)

## 2016-09-26 LAB — PSA: PSA: 1.04 ng/mL (ref 0.10–4.00)

## 2016-09-26 LAB — TSH: TSH: 1.49 u[IU]/mL (ref 0.35–4.50)

## 2016-09-26 LAB — HEMOGLOBIN A1C: Hgb A1c MFr Bld: 6.7 % — ABNORMAL HIGH (ref 4.6–6.5)

## 2016-09-26 NOTE — Patient Instructions (Signed)

## 2016-09-26 NOTE — Progress Notes (Signed)
Pre visit review using our clinic review tool, if applicable. No additional management support is needed unless otherwise documented below in the visit note. 

## 2016-09-26 NOTE — Progress Notes (Signed)
Office Note 09/26/2016  CC:  Chief Complaint  Patient presents with  . Annual Exam    CPE    HPI:  Derrick Willis is a 56 y.o. male who is here for annual health maintenance exam. Feeling fine.  Does usual activities w/out any problematic symptoms such as CP, SOB, palpitations, dizziness, or fatigue.  FEET:  No burning, tingling, or numbness in feet.  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   Balance feels bad, hx of spinal stenosis.  Occ gets brief bouts of lower ext weakness. Has seen Dr. Vertell Limber for both spinal issues and CTS and needs to get back in with him.  Past Medical History:  Diagnosis Date  . Asthma    "grew out of it", esp after quitting smoking arond age 89 yrs old  . Atypical chest pain 06/12/2014  . Blood in stool   . Carpal tunnel syndrome    bilat, L>>R (Dr. Vertell Limber)  . Diabetes mellitus without complication ([place])    No diab retinop as of 12/2014  . Gout    Knee and toe--allopurinol since approx 2012 and has had no flares since being on this med (1/2 of 300 mg tab qd)  . Hyperkalemia 2015/2016   low K diet; cut lisinopril from 10mg  to 5mg  qd 11/2014.  Marland Kitchen Hyperlipidemia    med x 1 trial=adverse side effects so he stopped it.  LDL 130s 08/2014, then dx'd with DM 2 shortly after, pt refused another trial of statin 07/2015.  Marland Kitchen Hypertension   . Sleep apnea    has cpap but dosnt use much   . Umbilical hernia [id number]    Past Surgical History:  Procedure Laterality Date  . COLONOSCOPY W/ POLYPECTOMY  10/13/14   Hyperplastic; recall 10 yrs  . VASECTOMY    . WISDOM TOOTH EXTRACTION      Family History  Problem Relation Age of Onset  . Arthritis Mother   . Arthritis Father   . Hyperlipidemia Father   . Hypertension Father   . Colon polyps Neg Hx   . Esophageal cancer Neg Hx   . Rectal cancer Neg Hx   . Stomach cancer Neg Hx     Social History   Social History  . Marital status: Legally Separated    Spouse name: N/A   . Number of children: N/A  . Years of education: N/A   Occupational History  . Not on file.   Social History Main Topics  . Smoking status: Former Smoker    Types: Cigars    Quit date: 05/31/1990  . Smokeless tobacco: Never Used     Comment: Quit smoking Cigars 10/2015  . Alcohol use 0.6 - 1.2 oz/week    1 - 2 Glasses of wine per week     Comment: 3 x weekly  . Drug use: No  . Sexual activity: Not on file   Other Topics Concern  . Not on file   Social History Narrative   Divorced.  Two children, both young adults (one son plans to go to med school).   Occupation: Grading contractor-also a Psychologist, sport and exercise.   Education: 2 years of college.   Orig from [place], still lives there.   Tob 30 pack-yr hx, quit around age 27.   Social drinker.     No regular exercise.  Eating habits poor.    Outpatient Medications Prior to Visit  Medication Sig Dispense Refill  .  allopurinol (ZYLOPRIM) 300 MG tablet TAKE ONE TABLET BY MOUTH ONCE DAILY 30 tablet 12  . aspirin 81 MG tablet Take 81 mg by mouth daily.    . hydrochlorothiazide (HYDRODIURIL) 25 MG tablet Take 1 tablet (25 mg total) by mouth daily. 30 tablet 3  . lisinopril (PRINIVIL,ZESTRIL) 5 MG tablet TAKE ONE TABLET BY MOUTH ONCE DAILY 90 tablet 3  . metFORMIN (GLUCOPHAGE) 500 MG tablet TAKE ONE TABLET BY MOUTH TWICE DAILY WITH MEALS 60 tablet 6  . Misc Natural Products (COMPLETE PROSTATE HEALTH PO) Take 1 tablet by mouth daily.    . Multiple Vitamin (MULTIVITAMIN) tablet Take 1 tablet by mouth daily.    Marland Kitchen OVER THE COUNTER MEDICATION Take 1 tablet by mouth daily. cholestoff-over the counter supplement     No facility-administered medications prior to visit.     No Known Allergies  ROS Review of Systems  Constitutional: Negative for appetite change, chills, fatigue and fever.  HENT: Negative for congestion, dental problem, ear pain and sore throat.   Eyes: Negative for discharge, redness and visual disturbance.  Respiratory: Negative  for cough, chest tightness, shortness of breath and wheezing.   Cardiovascular: Negative for chest pain, palpitations and leg swelling.  Gastrointestinal: Negative for abdominal pain, blood in stool, diarrhea, nausea and vomiting.  Genitourinary: Negative for difficulty urinating, dysuria, flank pain, frequency, hematuria and urgency.  Musculoskeletal: Positive for back pain (chronic low back). Negative for arthralgias, joint swelling, myalgias and neck stiffness.  Skin: Negative for pallor and rash.  Neurological: Positive for numbness (see hpi). Negative for dizziness, speech difficulty, weakness and headaches.  Hematological: Negative for adenopathy. Does not bruise/bleed easily.  Psychiatric/Behavioral: Negative for confusion and sleep disturbance. The patient is not nervous/anxious.     PE; Blood pressure 140/80, pulse 60, temperature 98.2 F (36.8 C), temperature source Temporal, resp. rate 16, height 5' 4.5" (1.638 m), weight 193 lb (87.5 kg), SpO2 98 %. Gen: Alert, well appearing.  Patient is oriented to person, place, time, and situation. AFFECT: pleasant, lucid thought and speech. ENT: Ears: EACs clear, normal epithelium.  TMs with good light reflex and landmarks bilaterally.  Eyes: no injection, icteris, swelling, or exudate.  EOMI, PERRLA. Nose: no drainage or turbinate edema/swelling.  No injection or focal lesion.  Mouth: lips without lesion/swelling.  Oral mucosa pink and moist.  Dentition intact and without obvious caries or gingival swelling.  Oropharynx without erythema, exudate, or swelling.  Neck: supple/nontender.  No LAD, mass, or TM.  Carotid pulses 2+ bilaterally, without bruits. CV: RRR, no m/r/g.   LUNGS: CTA bilat, nonlabored resps, good aeration in all lung fields. ABD: soft, NT, ND, BS normal.  No hepatospenomegaly or mass.  No bruits. EXT: no clubbing, cyanosis, or edema.  Musculoskeletal: no joint swelling, erythema, warmth, or tenderness.  ROM of all joints  intact. Skin - no sores or suspicious lesions or rashes or color changes Rectal exam: negative without mass, lesions or tenderness, PROSTATE EXAM: smooth and symmetric without nodules or tenderness.   Pertinent labs:  Lab Results  Component Value Date   TSH 1.30 11/28/2014   Lab Results  Component Value Date   WBC 9.0 08/08/2015   HGB 16.5 08/08/2015   HCT 49.8 08/08/2015   MCV 88.7 08/08/2015   PLT 202.0 08/08/2015   Lab Results  Component Value Date   CREATININE 1.10 08/27/2016   BUN 26 (H) 08/27/2016   NA 137 08/27/2016   K 4.9 08/27/2016   CL 98 08/27/2016  CO2 33 (H) 08/27/2016   Lab Results  Component Value Date   ALT 32 08/08/2015   AST 21 08/08/2015   ALKPHOS 62 08/08/2015   BILITOT 0.6 08/08/2015   Lab Results  Component Value Date   CHOL 217 (H) 08/08/2015   Lab Results  Component Value Date   HDL 56.70 08/08/2015   Lab Results  Component Value Date   LDLCALC 138 (H) 08/08/2015   Lab Results  Component Value Date   TRIG 112.0 08/08/2015   Lab Results  Component Value Date   CHOLHDL 4 08/08/2015   Lab Results  Component Value Date   PSA 1.08 08/08/2015   PSA 1.56 06/01/2014   Lab Results  Component Value Date   HGBA1C 6.0 04/05/2016    ASSESSMENT AND PLAN:   Health maintenance exam: Reviewed age and gender appropriate health maintenance issues (prudent diet, regular exercise, health risks of tobacco and excessive alcohol, use of seatbelts, fire alarms in home, use of sunscreen).  Also reviewed age and gender appropriate health screening as well as vaccine recommendations. Fasting HP labs today + HbA1c (for DM 2). Diabetic foot exam normal today. Prostate ca screening: DRE normal today, PSA drawn. Colon cancer screening: next colonoscopy due 09/2024.  He'll contact Dr. Donald Pore office for appt to discuss possible next step for his L CTS and his spinal stenosis.  An After Visit Summary was printed and given to the patient.  FOLLOW UP:   Return in about 4 months (around 01/24/2017) for routine chronic illness f/u.  Signed:  Crissie Sickles, MD           09/26/2016

## 2016-10-16 DIAGNOSIS — M5126 Other intervertebral disc displacement, lumbar region: Secondary | ICD-10-CM | POA: Diagnosis not present

## 2016-10-16 DIAGNOSIS — G5603 Carpal tunnel syndrome, bilateral upper limbs: Secondary | ICD-10-CM | POA: Diagnosis not present

## 2016-10-16 DIAGNOSIS — M5416 Radiculopathy, lumbar region: Secondary | ICD-10-CM | POA: Diagnosis not present

## 2016-10-16 DIAGNOSIS — M79641 Pain in right hand: Secondary | ICD-10-CM | POA: Diagnosis not present

## 2016-10-30 DIAGNOSIS — M5416 Radiculopathy, lumbar region: Secondary | ICD-10-CM | POA: Diagnosis not present

## 2016-10-30 DIAGNOSIS — M48061 Spinal stenosis, lumbar region without neurogenic claudication: Secondary | ICD-10-CM | POA: Diagnosis not present

## 2016-10-30 DIAGNOSIS — M5126 Other intervertebral disc displacement, lumbar region: Secondary | ICD-10-CM | POA: Diagnosis not present

## 2016-11-11 DIAGNOSIS — M48062 Spinal stenosis, lumbar region with neurogenic claudication: Secondary | ICD-10-CM | POA: Diagnosis not present

## 2016-11-11 DIAGNOSIS — M5416 Radiculopathy, lumbar region: Secondary | ICD-10-CM | POA: Diagnosis not present

## 2016-11-11 DIAGNOSIS — I10 Essential (primary) hypertension: Secondary | ICD-10-CM | POA: Diagnosis not present

## 2016-11-11 DIAGNOSIS — M9983 Other biomechanical lesions of lumbar region: Secondary | ICD-10-CM | POA: Diagnosis not present

## 2016-11-12 DIAGNOSIS — I7300 Raynaud's syndrome without gangrene: Secondary | ICD-10-CM | POA: Diagnosis not present

## 2016-11-12 DIAGNOSIS — G5603 Carpal tunnel syndrome, bilateral upper limbs: Secondary | ICD-10-CM | POA: Diagnosis not present

## 2016-11-12 DIAGNOSIS — M109 Gout, unspecified: Secondary | ICD-10-CM | POA: Diagnosis not present

## 2016-11-12 DIAGNOSIS — M7989 Other specified soft tissue disorders: Secondary | ICD-10-CM | POA: Diagnosis not present

## 2016-11-12 DIAGNOSIS — H04123 Dry eye syndrome of bilateral lacrimal glands: Secondary | ICD-10-CM | POA: Diagnosis not present

## 2016-11-20 ENCOUNTER — Encounter: Payer: Self-pay | Admitting: Family Medicine

## 2016-12-05 DIAGNOSIS — M4807 Spinal stenosis, lumbosacral region: Secondary | ICD-10-CM | POA: Diagnosis not present

## 2016-12-05 DIAGNOSIS — M48062 Spinal stenosis, lumbar region with neurogenic claudication: Secondary | ICD-10-CM | POA: Diagnosis not present

## 2016-12-05 DIAGNOSIS — M4726 Other spondylosis with radiculopathy, lumbar region: Secondary | ICD-10-CM | POA: Diagnosis not present

## 2016-12-05 DIAGNOSIS — M48061 Spinal stenosis, lumbar region without neurogenic claudication: Secondary | ICD-10-CM | POA: Diagnosis not present

## 2016-12-05 DIAGNOSIS — M5416 Radiculopathy, lumbar region: Secondary | ICD-10-CM | POA: Diagnosis not present

## 2016-12-05 HISTORY — PX: BACK SURGERY: SHX140

## 2016-12-11 ENCOUNTER — Other Ambulatory Visit: Payer: Self-pay | Admitting: Family Medicine

## 2016-12-19 ENCOUNTER — Encounter: Payer: Self-pay | Admitting: Family Medicine

## 2016-12-19 DIAGNOSIS — M7989 Other specified soft tissue disorders: Secondary | ICD-10-CM | POA: Diagnosis not present

## 2016-12-19 DIAGNOSIS — G5603 Carpal tunnel syndrome, bilateral upper limbs: Secondary | ICD-10-CM | POA: Diagnosis not present

## 2016-12-19 DIAGNOSIS — I7300 Raynaud's syndrome without gangrene: Secondary | ICD-10-CM | POA: Diagnosis not present

## 2016-12-19 DIAGNOSIS — M109 Gout, unspecified: Secondary | ICD-10-CM | POA: Diagnosis not present

## 2016-12-28 ENCOUNTER — Other Ambulatory Visit: Payer: Self-pay | Admitting: Family Medicine

## 2017-01-01 DIAGNOSIS — G5623 Lesion of ulnar nerve, bilateral upper limbs: Secondary | ICD-10-CM | POA: Diagnosis not present

## 2017-01-01 DIAGNOSIS — G5603 Carpal tunnel syndrome, bilateral upper limbs: Secondary | ICD-10-CM | POA: Diagnosis not present

## 2017-01-01 DIAGNOSIS — M79642 Pain in left hand: Secondary | ICD-10-CM | POA: Diagnosis not present

## 2017-01-03 DIAGNOSIS — G5602 Carpal tunnel syndrome, left upper limb: Secondary | ICD-10-CM | POA: Diagnosis not present

## 2017-01-03 DIAGNOSIS — I7300 Raynaud's syndrome without gangrene: Secondary | ICD-10-CM | POA: Diagnosis not present

## 2017-01-03 DIAGNOSIS — M109 Gout, unspecified: Secondary | ICD-10-CM | POA: Diagnosis not present

## 2017-01-03 DIAGNOSIS — G5603 Carpal tunnel syndrome, bilateral upper limbs: Secondary | ICD-10-CM | POA: Diagnosis not present

## 2017-01-03 DIAGNOSIS — M7989 Other specified soft tissue disorders: Secondary | ICD-10-CM | POA: Diagnosis not present

## 2017-01-05 ENCOUNTER — Encounter: Payer: Self-pay | Admitting: Family Medicine

## 2017-01-13 DIAGNOSIS — M9901 Segmental and somatic dysfunction of cervical region: Secondary | ICD-10-CM | POA: Diagnosis not present

## 2017-01-13 DIAGNOSIS — M9902 Segmental and somatic dysfunction of thoracic region: Secondary | ICD-10-CM | POA: Diagnosis not present

## 2017-01-13 DIAGNOSIS — M9907 Segmental and somatic dysfunction of upper extremity: Secondary | ICD-10-CM | POA: Diagnosis not present

## 2017-01-13 DIAGNOSIS — M25511 Pain in right shoulder: Secondary | ICD-10-CM | POA: Diagnosis not present

## 2017-01-14 DIAGNOSIS — M25511 Pain in right shoulder: Secondary | ICD-10-CM | POA: Diagnosis not present

## 2017-01-14 DIAGNOSIS — M9902 Segmental and somatic dysfunction of thoracic region: Secondary | ICD-10-CM | POA: Diagnosis not present

## 2017-01-14 DIAGNOSIS — M9901 Segmental and somatic dysfunction of cervical region: Secondary | ICD-10-CM | POA: Diagnosis not present

## 2017-01-14 DIAGNOSIS — M9907 Segmental and somatic dysfunction of upper extremity: Secondary | ICD-10-CM | POA: Diagnosis not present

## 2017-01-15 DIAGNOSIS — M9902 Segmental and somatic dysfunction of thoracic region: Secondary | ICD-10-CM | POA: Diagnosis not present

## 2017-01-15 DIAGNOSIS — M9901 Segmental and somatic dysfunction of cervical region: Secondary | ICD-10-CM | POA: Diagnosis not present

## 2017-01-15 DIAGNOSIS — M9907 Segmental and somatic dysfunction of upper extremity: Secondary | ICD-10-CM | POA: Diagnosis not present

## 2017-01-15 DIAGNOSIS — M25511 Pain in right shoulder: Secondary | ICD-10-CM | POA: Diagnosis not present

## 2017-01-16 DIAGNOSIS — M9901 Segmental and somatic dysfunction of cervical region: Secondary | ICD-10-CM | POA: Diagnosis not present

## 2017-01-16 DIAGNOSIS — M9902 Segmental and somatic dysfunction of thoracic region: Secondary | ICD-10-CM | POA: Diagnosis not present

## 2017-01-16 DIAGNOSIS — M9907 Segmental and somatic dysfunction of upper extremity: Secondary | ICD-10-CM | POA: Diagnosis not present

## 2017-01-16 DIAGNOSIS — M25511 Pain in right shoulder: Secondary | ICD-10-CM | POA: Diagnosis not present

## 2017-01-21 DIAGNOSIS — M25511 Pain in right shoulder: Secondary | ICD-10-CM | POA: Diagnosis not present

## 2017-01-21 DIAGNOSIS — M9902 Segmental and somatic dysfunction of thoracic region: Secondary | ICD-10-CM | POA: Diagnosis not present

## 2017-01-21 DIAGNOSIS — M9901 Segmental and somatic dysfunction of cervical region: Secondary | ICD-10-CM | POA: Diagnosis not present

## 2017-01-21 DIAGNOSIS — M9907 Segmental and somatic dysfunction of upper extremity: Secondary | ICD-10-CM | POA: Diagnosis not present

## 2017-01-22 DIAGNOSIS — M9902 Segmental and somatic dysfunction of thoracic region: Secondary | ICD-10-CM | POA: Diagnosis not present

## 2017-01-22 DIAGNOSIS — M9901 Segmental and somatic dysfunction of cervical region: Secondary | ICD-10-CM | POA: Diagnosis not present

## 2017-01-22 DIAGNOSIS — M9907 Segmental and somatic dysfunction of upper extremity: Secondary | ICD-10-CM | POA: Diagnosis not present

## 2017-01-22 DIAGNOSIS — M25511 Pain in right shoulder: Secondary | ICD-10-CM | POA: Diagnosis not present

## 2017-01-23 DIAGNOSIS — M9907 Segmental and somatic dysfunction of upper extremity: Secondary | ICD-10-CM | POA: Diagnosis not present

## 2017-01-23 DIAGNOSIS — M9902 Segmental and somatic dysfunction of thoracic region: Secondary | ICD-10-CM | POA: Diagnosis not present

## 2017-01-23 DIAGNOSIS — M9901 Segmental and somatic dysfunction of cervical region: Secondary | ICD-10-CM | POA: Diagnosis not present

## 2017-01-23 DIAGNOSIS — M25511 Pain in right shoulder: Secondary | ICD-10-CM | POA: Diagnosis not present

## 2017-01-27 DIAGNOSIS — M9902 Segmental and somatic dysfunction of thoracic region: Secondary | ICD-10-CM | POA: Diagnosis not present

## 2017-01-27 DIAGNOSIS — M25511 Pain in right shoulder: Secondary | ICD-10-CM | POA: Diagnosis not present

## 2017-01-27 DIAGNOSIS — M9901 Segmental and somatic dysfunction of cervical region: Secondary | ICD-10-CM | POA: Diagnosis not present

## 2017-01-27 DIAGNOSIS — M9907 Segmental and somatic dysfunction of upper extremity: Secondary | ICD-10-CM | POA: Diagnosis not present

## 2017-01-28 ENCOUNTER — Ambulatory Visit (INDEPENDENT_AMBULATORY_CARE_PROVIDER_SITE_OTHER): Payer: BLUE CROSS/BLUE SHIELD | Admitting: Family Medicine

## 2017-01-28 ENCOUNTER — Encounter: Payer: Self-pay | Admitting: Family Medicine

## 2017-01-28 VITALS — BP 134/80 | HR 67 | Temp 98.2°F | Resp 16 | Ht 64.5 in | Wt 190.5 lb

## 2017-01-28 DIAGNOSIS — I10 Essential (primary) hypertension: Secondary | ICD-10-CM

## 2017-01-28 DIAGNOSIS — Z8639 Personal history of other endocrine, nutritional and metabolic disease: Secondary | ICD-10-CM | POA: Diagnosis not present

## 2017-01-28 DIAGNOSIS — M9901 Segmental and somatic dysfunction of cervical region: Secondary | ICD-10-CM | POA: Diagnosis not present

## 2017-01-28 DIAGNOSIS — M9902 Segmental and somatic dysfunction of thoracic region: Secondary | ICD-10-CM | POA: Diagnosis not present

## 2017-01-28 DIAGNOSIS — E119 Type 2 diabetes mellitus without complications: Secondary | ICD-10-CM

## 2017-01-28 DIAGNOSIS — E7800 Pure hypercholesterolemia, unspecified: Secondary | ICD-10-CM

## 2017-01-28 DIAGNOSIS — M25511 Pain in right shoulder: Secondary | ICD-10-CM | POA: Diagnosis not present

## 2017-01-28 DIAGNOSIS — M9907 Segmental and somatic dysfunction of upper extremity: Secondary | ICD-10-CM | POA: Diagnosis not present

## 2017-01-28 LAB — BASIC METABOLIC PANEL
BUN: 22 mg/dL (ref 6–23)
CO2: 31 meq/L (ref 19–32)
CREATININE: 1.16 mg/dL (ref 0.40–1.50)
Calcium: 9.8 mg/dL (ref 8.4–10.5)
Chloride: 101 mEq/L (ref 96–112)
GFR: 69.13 mL/min (ref >60.00)
Glucose, Bld: 144 mg/dL — ABNORMAL HIGH (ref 70–99)
Potassium: 4.9 mEq/L (ref 3.5–5.1)
Sodium: 139 mEq/L (ref 135–145)

## 2017-01-28 LAB — HEMOGLOBIN A1C: Hgb A1c MFr Bld: 6.7 % — ABNORMAL HIGH (ref 4.6–6.5)

## 2017-01-28 NOTE — Progress Notes (Signed)
OFFICE VISIT  01/28/2017   CC:  Chief Complaint  Patient presents with  . Follow-up    RCI, pt is fasting.    HPI:    Patient is a 56 y.o.  male who presents for 4 mo f/u DM 2, HTN, HLD, and hx of hyperkalemia. Labs were all stable at last visit.  Pt continued to refuse statin trial at that time.  Has had back surgery since last visit. Has also been undergoing eval for hand numbness--has CTS bilat.  Saw rheum for r/o rheum arthritis. Prednisone rx from rheum caused excessive fatigue.    DM :  No home glucose monitoring, trying to eat diabetic diet somewhat.  Has been walking 3-4 miles per day for last several weeks.  HTN: Home bp monitoring 130/75. HR 60s.  HLD: he continues to decline statin medication.  Past Medical History:  Diagnosis Date  . Asthma    "grew out of it", esp after quitting smoking arond age 56 yrs old  . Atypical chest pain 06/12/2014  . Blood in stool   . Carpal tunnel syndrome    bilat, L>>R (Dr. Vertell Limber).  Hand symptoms prompted referral to rheum--lab w/u NEG.  Wrist splinting x 6 wks advised, not much help so low dose prednisone rx'd, then pt had to get steroid injection into L carpal tunnel 12/2016.  . Diabetes mellitus without complication ([place])    No diab retinop as of 12/2014  . Gout    Knee and toe--allopurinol since approx 2012 and has had no flares since being on this med (1/2 of 300 mg tab qd)  . Hyperkalemia 2015/2016   low K diet; cut lisinopril from 10mg  to 5mg  qd 11/2014.  Marland Kitchen Hyperlipidemia    med x 1 trial=adverse side effects so he stopped it.  LDL 130s 08/2014, then dx'd with DM 2 shortly after, pt refused another trial of statin 07/2015.  Marland Kitchen Hypertension   . Sleep apnea    has cpap but dosnt use much   . Umbilical hernia 3016    Past Surgical History:  Procedure Laterality Date  . COLONOSCOPY W/ POLYPECTOMY  10/13/14   Hyperplastic; recall 10 yrs  . VASECTOMY    . WISDOM TOOTH EXTRACTION      Outpatient Medications Prior to Visit   Medication Sig Dispense Refill  . allopurinol (ZYLOPRIM) 300 MG tablet TAKE ONE TABLET BY MOUTH ONCE DAILY 30 tablet 12  . aspirin 81 MG tablet Take 81 mg by mouth daily.    . hydrochlorothiazide (HYDRODIURIL) 25 MG tablet TAKE ONE TABLET BY MOUTH ONCE DAILY 30 tablet 3  . lisinopril (PRINIVIL,ZESTRIL) 5 MG tablet TAKE ONE TABLET BY MOUTH ONCE DAILY 90 tablet 1  . metFORMIN (GLUCOPHAGE) 500 MG tablet TAKE ONE TABLET BY MOUTH TWICE DAILY WITH MEALS 60 tablet 6  . Misc Natural Products (COMPLETE PROSTATE HEALTH PO) Take 1 tablet by mouth daily.    . Multiple Vitamin (MULTIVITAMIN) tablet Take 1 tablet by mouth daily.    Marland Kitchen OVER THE COUNTER MEDICATION Take 1 tablet by mouth daily. cholestoff-over the counter supplement     No facility-administered medications prior to visit.     Allergies  Allergen Reactions  . Prednisone Other (See Comments)    Headaches, muscle aches, fatigue    ROS As per HPI  PE: Repeat bp today: 134/80 Blood pressure 134/80, pulse 67, temperature 98.2 F (36.8 C), temperature source Oral, resp. rate 16, height 5' 4.5" (1.638 m), weight 190 lb 8 oz (86.4  kg), SpO2 97 %. Gen: Alert, well appearing.  Patient is oriented to person, place, time, and situation. AFFECT: pleasant, lucid thought and speech. CV: RRR, no m/r/g.   LUNGS: CTA bilat, nonlabored resps, good aeration in all lung fields. EXT: no clubbing, cyanosis, or edema.   LABS:  Lab Results  Component Value Date   TSH 1.49 09/26/2016   Lab Results  Component Value Date   WBC 11.0 (H) 09/26/2016   HGB 16.7 09/26/2016   HCT 49.1 09/26/2016   MCV 88.7 09/26/2016   PLT 234.0 09/26/2016   Lab Results  Component Value Date   CREATININE 1.03 09/26/2016   BUN 17 09/26/2016   NA 141 09/26/2016   K 4.8 09/26/2016   CL 103 09/26/2016   CO2 34 (H) 09/26/2016   Lab Results  Component Value Date   ALT 43 09/26/2016   AST 20 09/26/2016   ALKPHOS 73 09/26/2016   BILITOT 0.5 09/26/2016   Lab Results   Component Value Date   CHOL 207 (H) 09/26/2016   Lab Results  Component Value Date   HDL 53.50 09/26/2016   Lab Results  Component Value Date   LDLCALC 119 (H) 09/26/2016   Lab Results  Component Value Date   TRIG 172.0 (H) 09/26/2016   Lab Results  Component Value Date   CHOLHDL 4 09/26/2016   Lab Results  Component Value Date   PSA 1.04 09/26/2016   PSA 1.08 08/08/2015   PSA 1.56 06/01/2014   Lab Results  Component Value Date   HGBA1C 6.7 (H) 09/26/2016   IMPRESSION AND PLAN:  1) DM 2: no home glucose monitoring. Overdue for diab retpthy eye exam--he was reminded to do this ASAP. HbA1c today.  2) HTN: The current medical regimen is effective;  continue present plan and medications. Lytes/cr today.  3) HLD: pt continues to insist on TLC treatment for this, refuses statin "until I absolutely have to".  4) Hx of hyperkalemia: resolved with cutting ACE-I dose in 1/2. Lytes/Cr today.  An After Visit Summary was printed and given to the patient.  FOLLOW UP: Return in about 4 months (around 05/30/2017) for routine chronic illness f/u.  Signed:  Crissie Sickles, MD           01/28/2017

## 2017-01-29 DIAGNOSIS — M9902 Segmental and somatic dysfunction of thoracic region: Secondary | ICD-10-CM | POA: Diagnosis not present

## 2017-01-29 DIAGNOSIS — M25511 Pain in right shoulder: Secondary | ICD-10-CM | POA: Diagnosis not present

## 2017-01-29 DIAGNOSIS — M9901 Segmental and somatic dysfunction of cervical region: Secondary | ICD-10-CM | POA: Diagnosis not present

## 2017-01-29 DIAGNOSIS — M9907 Segmental and somatic dysfunction of upper extremity: Secondary | ICD-10-CM | POA: Diagnosis not present

## 2017-02-03 DIAGNOSIS — M9902 Segmental and somatic dysfunction of thoracic region: Secondary | ICD-10-CM | POA: Diagnosis not present

## 2017-02-03 DIAGNOSIS — M9901 Segmental and somatic dysfunction of cervical region: Secondary | ICD-10-CM | POA: Diagnosis not present

## 2017-02-03 DIAGNOSIS — M25511 Pain in right shoulder: Secondary | ICD-10-CM | POA: Diagnosis not present

## 2017-02-03 DIAGNOSIS — M9907 Segmental and somatic dysfunction of upper extremity: Secondary | ICD-10-CM | POA: Diagnosis not present

## 2017-02-04 DIAGNOSIS — M9901 Segmental and somatic dysfunction of cervical region: Secondary | ICD-10-CM | POA: Diagnosis not present

## 2017-02-04 DIAGNOSIS — M25511 Pain in right shoulder: Secondary | ICD-10-CM | POA: Diagnosis not present

## 2017-02-04 DIAGNOSIS — M9902 Segmental and somatic dysfunction of thoracic region: Secondary | ICD-10-CM | POA: Diagnosis not present

## 2017-02-04 DIAGNOSIS — M9907 Segmental and somatic dysfunction of upper extremity: Secondary | ICD-10-CM | POA: Diagnosis not present

## 2017-02-05 DIAGNOSIS — M9907 Segmental and somatic dysfunction of upper extremity: Secondary | ICD-10-CM | POA: Diagnosis not present

## 2017-02-05 DIAGNOSIS — M25511 Pain in right shoulder: Secondary | ICD-10-CM | POA: Diagnosis not present

## 2017-02-05 DIAGNOSIS — M9902 Segmental and somatic dysfunction of thoracic region: Secondary | ICD-10-CM | POA: Diagnosis not present

## 2017-02-05 DIAGNOSIS — M9901 Segmental and somatic dysfunction of cervical region: Secondary | ICD-10-CM | POA: Diagnosis not present

## 2017-02-10 DIAGNOSIS — M542 Cervicalgia: Secondary | ICD-10-CM | POA: Diagnosis not present

## 2017-02-13 DIAGNOSIS — M25511 Pain in right shoulder: Secondary | ICD-10-CM | POA: Diagnosis not present

## 2017-02-13 DIAGNOSIS — M9901 Segmental and somatic dysfunction of cervical region: Secondary | ICD-10-CM | POA: Diagnosis not present

## 2017-02-13 DIAGNOSIS — M9907 Segmental and somatic dysfunction of upper extremity: Secondary | ICD-10-CM | POA: Diagnosis not present

## 2017-02-13 DIAGNOSIS — M9902 Segmental and somatic dysfunction of thoracic region: Secondary | ICD-10-CM | POA: Diagnosis not present

## 2017-02-27 DIAGNOSIS — M50223 Other cervical disc displacement at C6-C7 level: Secondary | ICD-10-CM | POA: Diagnosis not present

## 2017-02-27 DIAGNOSIS — M5412 Radiculopathy, cervical region: Secondary | ICD-10-CM | POA: Diagnosis not present

## 2017-02-28 ENCOUNTER — Other Ambulatory Visit: Payer: Self-pay | Admitting: Neurosurgery

## 2017-02-28 DIAGNOSIS — I10 Essential (primary) hypertension: Secondary | ICD-10-CM | POA: Diagnosis not present

## 2017-02-28 DIAGNOSIS — M4802 Spinal stenosis, cervical region: Secondary | ICD-10-CM | POA: Diagnosis not present

## 2017-02-28 DIAGNOSIS — Z6832 Body mass index (BMI) 32.0-32.9, adult: Secondary | ICD-10-CM | POA: Diagnosis not present

## 2017-02-28 DIAGNOSIS — G959 Disease of spinal cord, unspecified: Secondary | ICD-10-CM | POA: Diagnosis not present

## 2017-03-04 NOTE — Pre-Procedure Instructions (Signed)
[facility]  03/04/2017      [facility] [address] [place] - [address] [place] [address] Phone: [phone] Fax: [phone]    Your procedure is scheduled on July 13  Report to [facility] at 530 A.M.  Call this number if you have problems the morning of surgery:  [phone]   Remember:  Do not eat food or drink liquids after midnight.  Take these medicines the morning of surgery with A SIP OF WATER Allopurinol (Zyloprim),  Methocarbamol (Robaxin) if needed  Stop/ take aspirin as directed by your Dr.   Stop taking BC's, goody's, Herbal medications, Fish Oil,  Ibuprofen, Advil, Motrin, Aleve, Vitamins     How to Manage Your Diabetes Before and After Surgery  Why is it important to control my blood sugar before and after surgery? . Improving blood sugar levels before and after surgery helps healing and can limit problems. . A way of improving blood sugar control is eating a healthy diet by: o  Eating less sugar and carbohydrates o  Increasing activity/exercise o  Talking with your doctor about reaching your blood sugar goals . High blood sugars (greater than 180 mg/dL) can raise your risk of infections and slow your recovery, so you will need to focus on controlling your diabetes during the weeks before surgery. . Make sure that the doctor who takes care of your diabetes knows about your planned surgery including the date and location.  How do I manage my blood sugar before surgery? . Check your blood sugar at least 4 times a day, starting 2 days before surgery, to make sure that the level is not too high or low. o Check your blood sugar the morning of your surgery when you wake up and every 2 hours until you get to the Short Stay unit. . If your blood sugar is less than 70 mg/dL, you will need to treat for low blood sugar: o Do not take insulin. o Treat a low blood sugar (less than 70 mg/dL) with  cup of  clear juice (cranberry or apple), 4 glucose tablets, OR glucose gel. o Recheck blood sugar in 15 minutes after treatment (to make sure it is greater than 70 mg/dL). If your blood sugar is not greater than 70 mg/dL on recheck, call [phone] for further instructions. . Report your blood sugar to the short stay nurse when you get to Short Stay.  . If you are admitted to the hospital after surgery: o Your blood sugar will be checked by the staff and you will probably be given insulin after surgery (instead of oral diabetes medicines) to make sure you have good blood sugar levels. o The goal for blood sugar control after surgery is 80-180 mg/dL.              WHAT DO I DO ABOUT MY DIABETES MEDICATION?   Marland Kitchen Do not take oral diabetes medicines (pills) the morning of surgery. Metformin (Glucopahge)  . THE NIGHT BEFORE SURGERY, take ___________ units of ___________insulin.       Marland Kitchen HE MORNING OF SURGERY, take _____________ units of __________insulin.  . The day of surgery, do not take other diabetes injectables, including Byetta (exenatide), Bydureon (exenatide ER), Victoza (liraglutide), or Trulicity (dulaglutide).  . If your CBG is greater than 220 mg/dL, you may take  of your sliding scale (correction) dose of insulin.  Other Instructions:  Patient Signature:  Date:   Nurse Signature:  Date:   Reviewed and Endorsed by [facility] Patient Education Committee, August 2015  Do not wear jewelry, make-up or nail polish.  Do not wear lotions, powders, or perfumes, or deoderant.  Do not shave 48 hours prior to surgery.  Men may shave face and neck.  Do not bring valuables to the hospital.  [facility] is not responsible for any belongings or valuables.  Contacts, dentures or bridgework may not be worn into surgery.  Leave your suitcase in the car.  After surgery it may be brought to your room.  For patients admitted to the hospital, discharge time will be determined  by your treatment team.  Patients discharged the day of surgery will not be allowed to drive home.    Special instructions:  [facility] - Preparing for Surgery  Before surgery, you can play an important role.  Because skin is not sterile, your skin needs to be as free of germs as possible.  You can reduce the number of germs on you skin by washing with CHG (chlorahexidine gluconate) soap before surgery.  CHG is an antiseptic cleaner which kills germs and bonds with the skin to continue killing germs even after washing.  Please DO NOT use if you have an allergy to CHG or antibacterial soaps.  If your skin becomes reddened/irritated stop using the CHG and inform your nurse when you arrive at Short Stay.  Do not shave (including legs and underarms) for at least 48 hours prior to the first CHG shower.  You may shave your face.  Please follow these instructions carefully:   1.  Shower with CHG Soap the night before surgery and the                                morning of Surgery.  2.  If you choose to wash your hair, wash your hair first as usual with your       normal shampoo.  3.  After you shampoo, rinse your hair and body thoroughly to remove the                      Shampoo.  4.  Use CHG as you would any other liquid soap.  You can apply chg directly       to the skin and wash gently with scrungie or a clean washcloth.  5.  Apply the CHG Soap to your body ONLY FROM THE NECK DOWN.        Do not use on open wounds or open sores.  Avoid contact with your eyes,       ears, mouth and genitals (private parts).  Wash genitals (private parts)       with your normal soap.  6.  Wash thoroughly, paying special attention to the area where your surgery        will be performed.  7.  Thoroughly rinse your body with warm water from the neck down.  8.  DO NOT shower/wash with your normal soap after using and rinsing off       the CHG Soap.  9.  Pat yourself dry with a clean towel.            10.  Wear  clean pajamas.            11.  Place clean sheets on your bed  the night of your first shower and do not        sleep with pets.  Day of Surgery  Do not apply any lotions/deoderants the morning of surgery.  Please wear clean clothes to the hospital/surgery center.     Please read over the following fact sheets that you were given. Pain Booklet, Coughing and Deep Breathing, MRSA Information and Surgical Site Infection Prevention

## 2017-03-05 ENCOUNTER — Encounter (HOSPITAL_COMMUNITY): Payer: Self-pay

## 2017-03-05 ENCOUNTER — Encounter (HOSPITAL_COMMUNITY)
Admission: RE | Admit: 2017-03-05 | Discharge: 2017-03-05 | Disposition: A | Discharge status: Home / Self Care | Payer: BLUE CROSS/BLUE SHIELD | Source: Ambulatory Visit | Attending: Neurosurgery | Admitting: Neurosurgery

## 2017-03-05 DIAGNOSIS — M50021 Cervical disc disorder at C4-C5 level with myelopathy: Secondary | ICD-10-CM | POA: Diagnosis not present

## 2017-03-05 DIAGNOSIS — I10 Essential (primary) hypertension: Secondary | ICD-10-CM | POA: Diagnosis not present

## 2017-03-05 DIAGNOSIS — Z87891 Personal history of nicotine dependence: Secondary | ICD-10-CM | POA: Diagnosis not present

## 2017-03-05 DIAGNOSIS — Z7982 Long term (current) use of aspirin: Secondary | ICD-10-CM | POA: Diagnosis not present

## 2017-03-05 DIAGNOSIS — M4802 Spinal stenosis, cervical region: Secondary | ICD-10-CM | POA: Diagnosis not present

## 2017-03-05 DIAGNOSIS — G9520 Unspecified cord compression: Secondary | ICD-10-CM | POA: Diagnosis not present

## 2017-03-05 DIAGNOSIS — Z9181 History of falling: Secondary | ICD-10-CM | POA: Diagnosis not present

## 2017-03-05 DIAGNOSIS — G959 Disease of spinal cord, unspecified: Secondary | ICD-10-CM | POA: Diagnosis not present

## 2017-03-05 DIAGNOSIS — E119 Type 2 diabetes mellitus without complications: Secondary | ICD-10-CM | POA: Diagnosis not present

## 2017-03-05 DIAGNOSIS — Z7984 Long term (current) use of oral hypoglycemic drugs: Secondary | ICD-10-CM | POA: Diagnosis not present

## 2017-03-05 DIAGNOSIS — M488X2 Other specified spondylopathies, cervical region: Secondary | ICD-10-CM | POA: Diagnosis not present

## 2017-03-05 DIAGNOSIS — Z79899 Other long term (current) drug therapy: Secondary | ICD-10-CM | POA: Diagnosis not present

## 2017-03-05 LAB — SURGICAL PCR SCREEN
MRSA, PCR: NEGATIVE
STAPHYLOCOCCUS AUREUS: NEGATIVE

## 2017-03-05 LAB — TYPE AND SCREEN
ABO/RH(D): O POS
Antibody Screen: NEGATIVE

## 2017-03-05 LAB — BASIC METABOLIC PANEL
ANION GAP: 8 (ref 5–15)
BUN: 21 mg/dL — ABNORMAL HIGH (ref 6–20)
CALCIUM: 9.8 mg/dL (ref 8.9–10.3)
CO2: 27 mmol/L (ref 22–32)
Chloride: 103 mmol/L (ref 101–111)
Creatinine, Ser: 1.11 mg/dL (ref 0.61–1.24)
Glucose, Bld: 137 mg/dL — ABNORMAL HIGH (ref 65–99)
POTASSIUM: 4.1 mmol/L (ref 3.5–5.1)
Sodium: 138 mmol/L (ref 135–145)

## 2017-03-05 LAB — CBC
HEMATOCRIT: 52 % (ref 39.0–52.0)
HEMOGLOBIN: 17 g/dL (ref 13.0–17.0)
MCH: 29.9 pg (ref 26.0–34.0)
MCHC: 32.7 g/dL (ref 30.0–36.0)
MCV: 91.5 fL (ref 78.0–100.0)
Platelets: 197 10*3/uL (ref 150–400)
RBC: 5.68 MIL/uL (ref 4.22–5.81)
RDW: 14.5 % (ref 11.5–15.5)
WBC: 9.5 10*3/uL (ref 4.0–10.5)

## 2017-03-05 LAB — ABO/RH: ABO/RH(D): O POS

## 2017-03-05 LAB — GLUCOSE, CAPILLARY: Glucose-Capillary: 128 mg/dL — ABNORMAL HIGH (ref 65–99)

## 2017-03-05 NOTE — Progress Notes (Signed)
PCP: Dr. Ernestine Conrad in [facility] Cardiologist: denies  EKG: Today CXR, ECHO, Stress Test, Cardiac Cath: denies  Fasting Blood Sugar- 100's Checks Blood Sugar infrequently, "mabye once every three to four days."  Patient denies shortness of breath, fever, cough, and chest pain at PAT appointment.  Patient verbalized understanding of instructions provided today at the PAT appointment.  Patient asked to review instructions at home and day of surgery.   Patient states he had a sleep study in [place] at [facility] before the merger with [facility] in the 1990's.  He used CPAP "years ago", but has not used CPAP for "several years since it became so difficult to obtain prescriptions for my machine."

## 2017-03-07 ENCOUNTER — Encounter (HOSPITAL_COMMUNITY): Payer: Self-pay | Admitting: *Deleted

## 2017-03-07 ENCOUNTER — Ambulatory Visit (HOSPITAL_COMMUNITY): Payer: BLUE CROSS/BLUE SHIELD | Admitting: Certified Registered"

## 2017-03-07 ENCOUNTER — Ambulatory Visit (HOSPITAL_COMMUNITY)
Admission: AD | Disposition: A | Discharge status: Home / Self Care | Payer: Self-pay | Source: Ambulatory Visit | Attending: Neurosurgery

## 2017-03-07 ENCOUNTER — Ambulatory Visit (HOSPITAL_COMMUNITY): Payer: BLUE CROSS/BLUE SHIELD | Admitting: Emergency Medicine

## 2017-03-07 ENCOUNTER — Ambulatory Visit (HOSPITAL_COMMUNITY): Payer: BLUE CROSS/BLUE SHIELD

## 2017-03-07 ENCOUNTER — Observation Stay (HOSPITAL_COMMUNITY)
Admission: AD | Admit: 2017-03-07 | Discharge: 2017-03-08 | DRG: 030 | Disposition: A | Discharge status: Home / Self Care | Payer: BLUE CROSS/BLUE SHIELD | Source: Ambulatory Visit | Attending: Neurosurgery | Admitting: Neurosurgery

## 2017-03-07 DIAGNOSIS — Z87891 Personal history of nicotine dependence: Secondary | ICD-10-CM | POA: Diagnosis not present

## 2017-03-07 DIAGNOSIS — Z7982 Long term (current) use of aspirin: Secondary | ICD-10-CM | POA: Diagnosis not present

## 2017-03-07 DIAGNOSIS — Z9181 History of falling: Secondary | ICD-10-CM | POA: Diagnosis not present

## 2017-03-07 DIAGNOSIS — M50921 Unspecified cervical disc disorder at C4-C5 level: Secondary | ICD-10-CM | POA: Diagnosis not present

## 2017-03-07 DIAGNOSIS — M50923 Unspecified cervical disc disorder at C6-C7 level: Secondary | ICD-10-CM | POA: Diagnosis not present

## 2017-03-07 DIAGNOSIS — G959 Disease of spinal cord, unspecified: Secondary | ICD-10-CM | POA: Diagnosis present

## 2017-03-07 DIAGNOSIS — Z79899 Other long term (current) drug therapy: Secondary | ICD-10-CM | POA: Diagnosis not present

## 2017-03-07 DIAGNOSIS — M488X2 Other specified spondylopathies, cervical region: Secondary | ICD-10-CM | POA: Diagnosis not present

## 2017-03-07 DIAGNOSIS — E119 Type 2 diabetes mellitus without complications: Secondary | ICD-10-CM | POA: Insufficient documentation

## 2017-03-07 DIAGNOSIS — I10 Essential (primary) hypertension: Secondary | ICD-10-CM | POA: Insufficient documentation

## 2017-03-07 DIAGNOSIS — M50021 Cervical disc disorder at C4-C5 level with myelopathy: Secondary | ICD-10-CM | POA: Diagnosis not present

## 2017-03-07 DIAGNOSIS — M4322 Fusion of spine, cervical region: Secondary | ICD-10-CM | POA: Diagnosis not present

## 2017-03-07 DIAGNOSIS — M4802 Spinal stenosis, cervical region: Secondary | ICD-10-CM | POA: Diagnosis not present

## 2017-03-07 DIAGNOSIS — M50922 Unspecified cervical disc disorder at C5-C6 level: Secondary | ICD-10-CM | POA: Diagnosis not present

## 2017-03-07 DIAGNOSIS — Z7984 Long term (current) use of oral hypoglycemic drugs: Secondary | ICD-10-CM | POA: Diagnosis not present

## 2017-03-07 DIAGNOSIS — Z419 Encounter for procedure for purposes other than remedying health state, unspecified: Secondary | ICD-10-CM

## 2017-03-07 HISTORY — PX: ANTERIOR CERVICAL DECOMP/DISCECTOMY FUSION: SHX1161

## 2017-03-07 LAB — GLUCOSE, CAPILLARY
GLUCOSE-CAPILLARY: 144 mg/dL — AB (ref 65–99)
GLUCOSE-CAPILLARY: 172 mg/dL — AB (ref 65–99)
GLUCOSE-CAPILLARY: 225 mg/dL — AB (ref 65–99)
Glucose-Capillary: 315 mg/dL — ABNORMAL HIGH (ref 65–99)

## 2017-03-07 SURGERY — ANTERIOR CERVICAL DECOMPRESSION/DISCECTOMY FUSION 3 LEVELS
Anesthesia: General

## 2017-03-07 MED ORDER — CEFAZOLIN SODIUM-DEXTROSE 2-4 GM/100ML-% IV SOLN
2.0000 g | INTRAVENOUS | Status: AC
  Administered 2017-03-07: 2 g via INTRAVENOUS

## 2017-03-07 MED ORDER — METHOCARBAMOL 500 MG PO TABS
500.0000 mg | ORAL_TABLET | Freq: Four times a day (QID) | ORAL | Status: DC | PRN
  Administered 2017-03-07 – 2017-03-08 (×2): 500 mg via ORAL
  Filled 2017-03-07 (×2): qty 1

## 2017-03-07 MED ORDER — BUPIVACAINE HCL (PF) 0.5 % IJ SOLN
INTRAMUSCULAR | Status: AC
  Filled 2017-03-07: qty 30

## 2017-03-07 MED ORDER — LIDOCAINE-EPINEPHRINE 1 %-1:100000 IJ SOLN
INTRAMUSCULAR | Status: DC | PRN
  Administered 2017-03-07: 5 mL

## 2017-03-07 MED ORDER — POLYETHYL GLYCOL-PROPYL GLYCOL 0.4-0.3 % OP SOLN: 1.0000 [drp] | Freq: Four times a day (QID) | OPHTHALMIC | Status: DC | PRN

## 2017-03-07 MED ORDER — KETOROLAC TROMETHAMINE 30 MG/ML IJ SOLN: 30.0000 mg | Freq: Once | INTRAMUSCULAR | Status: DC | PRN

## 2017-03-07 MED ORDER — THROMBIN 5000 UNITS EX SOLR
CUTANEOUS | Status: AC
  Filled 2017-03-07: qty 5000

## 2017-03-07 MED ORDER — MIDAZOLAM HCL 5 MG/5ML IJ SOLN
INTRAMUSCULAR | Status: DC | PRN
  Administered 2017-03-07: 2 mg via INTRAVENOUS

## 2017-03-07 MED ORDER — DOCUSATE SODIUM 100 MG PO CAPS
100.0000 mg | ORAL_CAPSULE | Freq: Two times a day (BID) | ORAL | Status: DC
  Administered 2017-03-07 – 2017-03-08 (×2): 100 mg via ORAL
  Filled 2017-03-07 (×2): qty 1

## 2017-03-07 MED ORDER — 0.9 % SODIUM CHLORIDE (POUR BTL) OPTIME
TOPICAL | Status: DC | PRN
  Administered 2017-03-07: 1000 mL

## 2017-03-07 MED ORDER — PROPOFOL 10 MG/ML IV BOLUS
INTRAVENOUS | Status: DC | PRN
  Administered 2017-03-07: 200 mg via INTRAVENOUS

## 2017-03-07 MED ORDER — THROMBIN 20000 UNITS EX SOLR
CUTANEOUS | Status: DC | PRN
  Administered 2017-03-07: 08:00:00 via TOPICAL

## 2017-03-07 MED ORDER — ALUM & MAG HYDROXIDE-SIMETH 200-200-20 MG/5ML PO SUSP: 30.0000 mL | Freq: Four times a day (QID) | ORAL | Status: DC | PRN

## 2017-03-07 MED ORDER — HYDROMORPHONE HCL 1 MG/ML IJ SOLN
0.2500 mg | INTRAMUSCULAR | Status: DC | PRN
  Administered 2017-03-07: 0.5 mg via INTRAVENOUS

## 2017-03-07 MED ORDER — SODIUM CHLORIDE 0.9 % IV SOLN
250.0000 mL | INTRAVENOUS | Status: DC
Start: 2017-03-07 — End: 2017-03-08

## 2017-03-07 MED ORDER — GELATIN ABSORBABLE MT POWD
OROMUCOSAL | Status: DC | PRN
  Administered 2017-03-07 (×2): via TOPICAL

## 2017-03-07 MED ORDER — THROMBIN 20000 UNITS EX SOLR
CUTANEOUS | Status: AC
  Filled 2017-03-07: qty 20000

## 2017-03-07 MED ORDER — ALLOPURINOL 300 MG PO TABS
300.0000 mg | ORAL_TABLET | Freq: Every day | ORAL | Status: DC
  Administered 2017-03-08: 300 mg via ORAL
  Filled 2017-03-07: qty 1

## 2017-03-07 MED ORDER — MENTHOL 3 MG MT LOZG: 1.0000 | LOZENGE | OROMUCOSAL | Status: DC | PRN

## 2017-03-07 MED ORDER — ONDANSETRON HCL 4 MG PO TABS: 4.0000 mg | ORAL_TABLET | Freq: Four times a day (QID) | ORAL | Status: DC | PRN

## 2017-03-07 MED ORDER — CHLORHEXIDINE GLUCONATE CLOTH 2 % EX PADS: 6.0000 | MEDICATED_PAD | Freq: Once | CUTANEOUS | Status: DC

## 2017-03-07 MED ORDER — DIAZEPAM 5 MG PO TABS
5.0000 mg | ORAL_TABLET | Freq: Four times a day (QID) | ORAL | Status: DC | PRN
  Administered 2017-03-07 – 2017-03-08 (×2): 5 mg via ORAL
  Filled 2017-03-07: qty 1

## 2017-03-07 MED ORDER — SODIUM CHLORIDE 0.9% FLUSH
3.0000 mL | Freq: Two times a day (BID) | INTRAVENOUS | Status: DC
  Administered 2017-03-07: 3 mL via INTRAVENOUS

## 2017-03-07 MED ORDER — ROCURONIUM BROMIDE 100 MG/10ML IV SOLN
INTRAVENOUS | Status: DC | PRN
  Administered 2017-03-07: 40 mg via INTRAVENOUS
  Administered 2017-03-07: 20 mg via INTRAVENOUS
  Administered 2017-03-07 (×2): 10 mg via INTRAVENOUS

## 2017-03-07 MED ORDER — METFORMIN HCL 500 MG PO TABS
500.0000 mg | ORAL_TABLET | Freq: Two times a day (BID) | ORAL | Status: DC
  Administered 2017-03-07 – 2017-03-08 (×2): 500 mg via ORAL
  Filled 2017-03-07 (×2): qty 1

## 2017-03-07 MED ORDER — ONDANSETRON HCL 4 MG/2ML IJ SOLN: 4.0000 mg | Freq: Four times a day (QID) | INTRAMUSCULAR | Status: DC | PRN

## 2017-03-07 MED ORDER — DEXAMETHASONE SODIUM PHOSPHATE 4 MG/ML IJ SOLN
4.0000 mg | Freq: Four times a day (QID) | INTRAMUSCULAR | Status: AC
  Administered 2017-03-07 – 2017-03-08 (×4): 4 mg via INTRAVENOUS
  Filled 2017-03-07 (×4): qty 1

## 2017-03-07 MED ORDER — BUPIVACAINE HCL (PF) 0.5 % IJ SOLN
INTRAMUSCULAR | Status: DC | PRN
  Administered 2017-03-07: 5 mL

## 2017-03-07 MED ORDER — FENTANYL CITRATE (PF) 100 MCG/2ML IJ SOLN
INTRAMUSCULAR | Status: DC | PRN
  Administered 2017-03-07 (×2): 50 ug via INTRAVENOUS
  Administered 2017-03-07: 150 ug via INTRAVENOUS
  Administered 2017-03-07: 25 ug via INTRAVENOUS
  Administered 2017-03-07 (×2): 50 ug via INTRAVENOUS

## 2017-03-07 MED ORDER — MIDAZOLAM HCL 2 MG/2ML IJ SOLN
INTRAMUSCULAR | Status: AC
  Filled 2017-03-07: qty 2

## 2017-03-07 MED ORDER — PHENOL 1.4 % MT LIQD
1.0000 | OROMUCOSAL | Status: DC | PRN
  Administered 2017-03-07: 1 via OROMUCOSAL
  Filled 2017-03-07: qty 177

## 2017-03-07 MED ORDER — OXYCODONE HCL 5 MG PO TABS
5.0000 mg | ORAL_TABLET | ORAL | Status: DC | PRN
  Administered 2017-03-07 (×3): 10 mg via ORAL
  Administered 2017-03-07: 5 mg via ORAL
  Administered 2017-03-08 (×2): 10 mg via ORAL
  Filled 2017-03-07 (×5): qty 2

## 2017-03-07 MED ORDER — SUGAMMADEX SODIUM 200 MG/2ML IV SOLN
INTRAVENOUS | Status: DC | PRN
  Administered 2017-03-07: 200 mg via INTRAVENOUS

## 2017-03-07 MED ORDER — ACETAMINOPHEN 325 MG PO TABS: 650.0000 mg | ORAL_TABLET | ORAL | Status: DC | PRN

## 2017-03-07 MED ORDER — MEPERIDINE HCL 25 MG/ML IJ SOLN: 6.2500 mg | INTRAMUSCULAR | Status: DC | PRN

## 2017-03-07 MED ORDER — OXYCODONE HCL 5 MG PO TABS
ORAL_TABLET | ORAL | Status: AC
  Filled 2017-03-07: qty 1

## 2017-03-07 MED ORDER — ONDANSETRON HCL 4 MG/2ML IJ SOLN
INTRAMUSCULAR | Status: DC | PRN
  Administered 2017-03-07: 4 mg via INTRAVENOUS

## 2017-03-07 MED ORDER — ASPIRIN EC 81 MG PO TBEC
81.0000 mg | DELAYED_RELEASE_TABLET | Freq: Every day | ORAL | Status: DC
  Administered 2017-03-07 – 2017-03-08 (×2): 81 mg via ORAL
  Filled 2017-03-07 (×2): qty 1

## 2017-03-07 MED ORDER — FLEET ENEMA 7-19 GM/118ML RE ENEM: 1.0000 | ENEMA | Freq: Once | RECTAL | Status: DC | PRN

## 2017-03-07 MED ORDER — PROPOFOL 10 MG/ML IV BOLUS
INTRAVENOUS | Status: AC
  Filled 2017-03-07: qty 20

## 2017-03-07 MED ORDER — CEFAZOLIN SODIUM-DEXTROSE 2-4 GM/100ML-% IV SOLN
2.0000 g | Freq: Three times a day (TID) | INTRAVENOUS | Status: AC
  Administered 2017-03-07 (×2): 2 g via INTRAVENOUS
  Filled 2017-03-07 (×2): qty 100

## 2017-03-07 MED ORDER — LACTATED RINGERS IV SOLN
INTRAVENOUS | Status: DC | PRN
  Administered 2017-03-07 (×2): via INTRAVENOUS

## 2017-03-07 MED ORDER — MORPHINE SULFATE (PF) 4 MG/ML IV SOLN: 2.0000 mg | INTRAVENOUS | Status: DC | PRN

## 2017-03-07 MED ORDER — SENNOSIDES-DOCUSATE SODIUM 8.6-50 MG PO TABS
1.0000 | ORAL_TABLET | Freq: Every evening | ORAL | Status: DC | PRN
  Administered 2017-03-07: 1 via ORAL
  Filled 2017-03-07: qty 1

## 2017-03-07 MED ORDER — LISINOPRIL 5 MG PO TABS
5.0000 mg | ORAL_TABLET | Freq: Every day | ORAL | Status: DC
  Administered 2017-03-07 – 2017-03-08 (×2): 5 mg via ORAL
  Filled 2017-03-07 (×2): qty 1

## 2017-03-07 MED ORDER — HYPROMELLOSE (GONIOSCOPIC) 2.5 % OP SOLN: 1.0000 [drp] | Freq: Four times a day (QID) | OPHTHALMIC | Status: DC | PRN

## 2017-03-07 MED ORDER — PANTOPRAZOLE SODIUM 40 MG IV SOLR
40.0000 mg | Freq: Every day | INTRAVENOUS | Status: DC
  Administered 2017-03-07: 40 mg via INTRAVENOUS
  Filled 2017-03-07: qty 40

## 2017-03-07 MED ORDER — FENTANYL CITRATE (PF) 250 MCG/5ML IJ SOLN
INTRAMUSCULAR | Status: AC
  Filled 2017-03-07: qty 5

## 2017-03-07 MED ORDER — LIDOCAINE HCL (CARDIAC) 20 MG/ML IV SOLN
INTRAVENOUS | Status: DC | PRN
  Administered 2017-03-07: 100 mg via INTRAVENOUS

## 2017-03-07 MED ORDER — SODIUM CHLORIDE 0.9% FLUSH: 3.0000 mL | INTRAVENOUS | Status: DC | PRN

## 2017-03-07 MED ORDER — LIDOCAINE-EPINEPHRINE 1 %-1:100000 IJ SOLN
INTRAMUSCULAR | Status: AC
  Filled 2017-03-07: qty 1

## 2017-03-07 MED ORDER — INSULIN ASPART 100 UNIT/ML ~~LOC~~ SOLN
0.0000 [IU] | Freq: Three times a day (TID) | SUBCUTANEOUS | Status: DC
  Administered 2017-03-07: 11 [IU] via SUBCUTANEOUS
  Administered 2017-03-08: 3 [IU] via SUBCUTANEOUS

## 2017-03-07 MED ORDER — KETOROLAC TROMETHAMINE 30 MG/ML IJ SOLN
INTRAMUSCULAR | Status: AC
  Administered 2017-03-07: 30 mg
  Filled 2017-03-07: qty 1

## 2017-03-07 MED ORDER — ACETAMINOPHEN 650 MG RE SUPP: 650.0000 mg | RECTAL | Status: DC | PRN

## 2017-03-07 MED ORDER — PROMETHAZINE HCL 25 MG/ML IJ SOLN: 6.2500 mg | INTRAMUSCULAR | Status: DC | PRN

## 2017-03-07 MED ORDER — HYDROMORPHONE HCL 1 MG/ML IJ SOLN
INTRAMUSCULAR | Status: AC
  Filled 2017-03-07: qty 0.5

## 2017-03-07 MED ORDER — DIAZEPAM 5 MG PO TABS
ORAL_TABLET | ORAL | Status: AC
  Filled 2017-03-07: qty 1

## 2017-03-07 MED ORDER — GABAPENTIN 300 MG PO CAPS
300.0000 mg | ORAL_CAPSULE | Freq: Three times a day (TID) | ORAL | Status: DC
  Administered 2017-03-07 – 2017-03-08 (×3): 300 mg via ORAL
  Filled 2017-03-07 (×3): qty 1

## 2017-03-07 MED ORDER — CEFAZOLIN SODIUM-DEXTROSE 2-4 GM/100ML-% IV SOLN
INTRAVENOUS | Status: AC
  Filled 2017-03-07: qty 100

## 2017-03-07 MED ORDER — BISACODYL 10 MG RE SUPP: 10.0000 mg | Freq: Every day | RECTAL | Status: DC | PRN

## 2017-03-07 MED ORDER — HYDROCHLOROTHIAZIDE 25 MG PO TABS
25.0000 mg | ORAL_TABLET | Freq: Every day | ORAL | Status: DC
  Administered 2017-03-07 – 2017-03-08 (×2): 25 mg via ORAL
  Filled 2017-03-07 (×2): qty 1

## 2017-03-07 MED ORDER — ZOLPIDEM TARTRATE 5 MG PO TABS: 5.0000 mg | ORAL_TABLET | Freq: Every evening | ORAL | Status: DC | PRN

## 2017-03-07 MED ORDER — DEXAMETHASONE SODIUM PHOSPHATE 10 MG/ML IJ SOLN
INTRAMUSCULAR | Status: DC | PRN
  Administered 2017-03-07: 10 mg via INTRAVENOUS

## 2017-03-07 MED ORDER — ADULT MULTIVITAMIN W/MINERALS CH
1.0000 | ORAL_TABLET | Freq: Every day | ORAL | Status: DC
  Administered 2017-03-08: 1 via ORAL
  Filled 2017-03-07: qty 1

## 2017-03-07 MED ORDER — KCL IN DEXTROSE-NACL 20-5-0.45 MEQ/L-%-% IV SOLN: INTRAVENOUS | Status: DC

## 2017-03-07 SURGICAL SUPPLY — 77 items
BASKET BONE COLLECTION (BASKET) ×2 IMPLANT
BIT DRILL NEURO 2X3.1 SFT TUCH (MISCELLANEOUS) ×1 IMPLANT
BIT DRILL POWER (BIT) ×1 IMPLANT
BLADE ULTRA TIP 2M (BLADE) IMPLANT
BNDG GAUZE ELAST 4 BULKY (GAUZE/BANDAGES/DRESSINGS) IMPLANT
BUR BARREL STRAIGHT FLUTE 4.0 (BURR) ×2 IMPLANT
BUR DRUM 4.0 (BURR) ×2 IMPLANT
CAGE 14X16X9 CERVICAL COHERE 7 (Cage) ×2 IMPLANT
CANISTER SUCT 3000ML PPV (MISCELLANEOUS) ×2 IMPLANT
CAP END 15X13 MONOLITH LORD 7 (Cap) ×1 IMPLANT
CAP END 15X13 MONOLITH PARA (Neuro Prosthesis/Implant) ×1 IMPLANT
CARTRIDGE OIL MAESTRO DRILL (MISCELLANEOUS) ×1 IMPLANT
CORE MONOLITH 012X20 (Miscellaneous) ×2 IMPLANT
COVER MAYO STAND STRL (DRAPES) ×2 IMPLANT
DECANTER SPIKE VIAL GLASS SM (MISCELLANEOUS) ×2 IMPLANT
DERMABOND ADVANCED (GAUZE/BANDAGES/DRESSINGS) ×1
DERMABOND ADVANCED .7 DNX12 (GAUZE/BANDAGES/DRESSINGS) ×1 IMPLANT
DIFFUSER DRILL AIR PNEUMATIC (MISCELLANEOUS) ×2 IMPLANT
DRAIN CHANNEL 10M FLAT 3/4 FLT (DRAIN) ×2 IMPLANT
DRAIN JACKSON PRATT 10MM FLAT (MISCELLANEOUS) ×2 IMPLANT
DRAPE HALF SHEET 40X57 (DRAPES) IMPLANT
DRAPE LAPAROTOMY 100X72 PEDS (DRAPES) ×2 IMPLANT
DRAPE MICROSCOPE LEICA (MISCELLANEOUS) ×2 IMPLANT
DRAPE POUCH INSTRU U-SHP 10X18 (DRAPES) ×2 IMPLANT
DRILL BIT POWER (BIT) ×2
DRILL NEURO 2X3.1 SOFT TOUCH (MISCELLANEOUS) ×2
DRSG OPSITE POSTOP 3X4 (GAUZE/BANDAGES/DRESSINGS) ×2 IMPLANT
DURAPREP 6ML APPLICATOR 50/CS (WOUND CARE) ×2 IMPLANT
ELECT COATED BLADE 2.86 ST (ELECTRODE) ×2 IMPLANT
ELECT REM PT RETURN 9FT ADLT (ELECTROSURGICAL) ×2
ELECTRODE REM PT RTRN 9FT ADLT (ELECTROSURGICAL) ×1 IMPLANT
ENDCAP MONOLITH LORD 15X13MM-7 (Cap) ×1 IMPLANT
ENDCAP MONOLITH PARA 15X13MM (Neuro Prosthesis/Implant) ×1 IMPLANT
EVACUATOR SILICONE 100CC (DRAIN) ×2 IMPLANT
GAUZE SPONGE 4X4 12PLY STRL (GAUZE/BANDAGES/DRESSINGS) IMPLANT
GAUZE SPONGE 4X4 16PLY XRAY LF (GAUZE/BANDAGES/DRESSINGS) IMPLANT
GLOVE BIO SURGEON STRL SZ8 (GLOVE) ×2 IMPLANT
GLOVE BIOGEL PI IND STRL 8 (GLOVE) ×1 IMPLANT
GLOVE BIOGEL PI IND STRL 8.5 (GLOVE) ×1 IMPLANT
GLOVE BIOGEL PI INDICATOR 8 (GLOVE) ×1
GLOVE BIOGEL PI INDICATOR 8.5 (GLOVE) ×1
GLOVE ECLIPSE 8.0 STRL XLNG CF (GLOVE) ×2 IMPLANT
GLOVE EXAM NITRILE LRG STRL (GLOVE) IMPLANT
GLOVE EXAM NITRILE XL STR (GLOVE) IMPLANT
GLOVE EXAM NITRILE XS STR PU (GLOVE) IMPLANT
GOWN STRL REUS W/ TWL LRG LVL3 (GOWN DISPOSABLE) IMPLANT
GOWN STRL REUS W/ TWL XL LVL3 (GOWN DISPOSABLE) ×1 IMPLANT
GOWN STRL REUS W/TWL 2XL LVL3 (GOWN DISPOSABLE) ×2 IMPLANT
GOWN STRL REUS W/TWL LRG LVL3 (GOWN DISPOSABLE)
GOWN STRL REUS W/TWL XL LVL3 (GOWN DISPOSABLE) ×2
HALTER HD/CHIN CERV TRACTION D (MISCELLANEOUS) ×2 IMPLANT
HEMOSTAT POWDER KIT SURGIFOAM (HEMOSTASIS) ×4 IMPLANT
KIT BASIN OR (CUSTOM PROCEDURE TRAY) ×2 IMPLANT
KIT ROOM TURNOVER OR (KITS) ×2 IMPLANT
NEEDLE HYPO 25X1 1.5 SAFETY (NEEDLE) ×2 IMPLANT
NEEDLE SPNL 18GX3.5 QUINCKE PK (NEEDLE) IMPLANT
NEEDLE SPNL 22GX3.5 QUINCKE BK (NEEDLE) ×2 IMPLANT
NS IRRIG 1000ML POUR BTL (IV SOLUTION) ×2 IMPLANT
OIL CARTRIDGE MAESTRO DRILL (MISCELLANEOUS) ×2
PACK LAMINECTOMY NEURO (CUSTOM PROCEDURE TRAY) ×2 IMPLANT
PAD ARMBOARD 7.5X6 YLW CONV (MISCELLANEOUS) ×6 IMPLANT
PATTIES SURGICAL 1X1 (DISPOSABLE) ×4 IMPLANT
PIN DISTRACTION 14MM (PIN) ×4 IMPLANT
PLATE ARCHON 3-LEVEL 62MM (Plate) ×2 IMPLANT
RUBBERBAND STERILE (MISCELLANEOUS) ×4 IMPLANT
SCREW ARCHON SELFTAP 4.0X13 (Screw) ×14 IMPLANT
SCREW ARCHON ST VAR 4.0X15MM (Screw) ×2 IMPLANT
SCREW BN 15X4XST VA NS SPN (Screw) ×1 IMPLANT
SPONGE INTESTINAL PEANUT (DISPOSABLE) ×2 IMPLANT
SPONGE SURGIFOAM ABS GEL 100 (HEMOSTASIS) ×2 IMPLANT
STAPLER SKIN PROX WIDE 3.9 (STAPLE) IMPLANT
SUT VIC AB 3-0 SH 8-18 (SUTURE) ×4 IMPLANT
SYR 5ML LL (SYRINGE) IMPLANT
TOWEL GREEN STERILE (TOWEL DISPOSABLE) ×2 IMPLANT
TOWEL GREEN STERILE FF (TOWEL DISPOSABLE) ×2 IMPLANT
TRAY FOLEY W/METER SILVER 16FR (SET/KITS/TRAYS/PACK) IMPLANT
WATER STERILE IRR 1000ML POUR (IV SOLUTION) ×2 IMPLANT

## 2017-03-07 NOTE — Transfer of Care (Signed)
Immediate Anesthesia Transfer of Care Note  Patient: Derrick Willis  Procedure(s) Performed: Procedure(s) with comments: Cervical four-five, Cervical five-six, Cerivcal six- seven Anterior cervical decompression/discectomy/fusion WITH CERVICAL FIVE COREPECTOMY (N/A) - C4-5 C5-6 C6-7 Anterior cervical decompression/discectomy/fusion  Patient Location: PACU  Anesthesia Type:General  Level of Consciousness: awake, alert , oriented and drowsy  Airway & Oxygen Therapy: Patient Spontanous Breathing and Patient connected to nasal cannula oxygen  Post-op Assessment: Report given to RN, Post -op Vital signs reviewed and stable and Patient moving all extremities X 4  Post vital signs: Reviewed and stable  Last Vitals:  Vitals:   03/07/17 0618  BP: (!) 144/88  Pulse: 60  Resp: 20  Temp: 36.7 C    Last Pain:  Vitals:   03/07/17 0618  TempSrc: Oral         Complications: No apparent anesthesia complications

## 2017-03-07 NOTE — Op Note (Signed)
03/07/2017  10:31 AM  PATIENT:  Derrick Willis  56 y.o. 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, OPLL, cord signal    PROCEDURE:  Procedure(s) with comments: Cervical four-five, Cervical five-six, Cerivcal six- seven Anterior cervical decompression/discectomy/fusion WITH CERVICAL FIVE COREPECTOMY (N/A) - C4-5 C5-6 C6-7 Anterior cervical decompression/discectomy/fusion with PEEK cage C 67 and anterior cervical plating C 4 - C 7 levels  SURGEON:  Surgeon(s) and Role:    Erline Levine, MD - Primary    * Consuella Lose, MD - Assisting  PHYSICIAN ASSISTANT:   ASSISTANTS: Poteat, RN   ANESTHESIA:   general  EBL:  Total I/O In: 1000 [I.V.:1000] Out: 150 [Blood:150]  BLOOD ADMINISTERED:none  DRAINS: (10) Jackson-Pratt drain(s) with closed bulb suction in the prevertebral space   LOCAL MEDICATIONS USED:  MARCAINE    and LIDOCAINE   SPECIMEN:  No Specimen  DISPOSITION OF SPECIMEN:  N/A  COUNTS:  YES  TOURNIQUET:  * No tourniquets in log *  DICTATION: DICTATION: Patient was brought to operating room and following the smooth and uncomplicated induction of general endotracheal anesthesia his head was placed on a horseshoe head holder he was placed in 5 pounds of Holter traction and his anterior neck was prepped and draped in usual sterile fashion. C arm was used for localization throughout the surgery.  An incision was made on the left side of midline after infiltrating the skin and subcutaneous tissues with local lidocaine. The platysmal layer was incised and subplatysmal dissection was performed exposing the anterior border sternocleidomastoid muscle. Using blunt dissection the carotid sheath was kept lateral and trachea and esophagus kept medial exposing the anterior cervical  spine. A bent spinal needle was placed it was felt to be the  C5-6 levels and this was confirmed on intraoperative x-ray. Longus coli muscles were taken down from the anterior cervical spine using electrocautery and key elevator and self-retaining retractor was placed. The interspaces at C 45, C 56 and C 67 were incised and a thorough discectomy was performed. Distraction pins were placed in C 4 and C 6 levels and a corpectomy of C 5 was performed with removal of herniated disc behind the midpoint of the C 5 vertebral body.  This was calcified and appeared consistent with OPLL.  Uncinate spurs and central spondylitic ridges were drilled down with a high-speed drill. The spinal cord dura and both C 4, C 5, C 6  nerve roots were widely decompressed along with the spinal cord dura. A Monolith Nuvasive corpectomy cage  27 mm in height x 15 mm wide x 13 mm deep with 7 degrees lordosis was packed with corpectomy bone and tamped into position after hemostasis had been assured.  Positioning of implant was confirmed with C arm. At the C 67 level, the interspace was thoroughly decompressed and discectomy was performed after placing distraction pins at  C6 and C 7 vertebrae, with removal of herniated disc and decompression of the cervical spinal cord dura.  Uncinate spurs and central spondylitic ridges were drilled down with a high-speed drill. The spinal cord dura and both C7 nerve roots were widely decompressed. Hemostasis was assured. After trial sizing a 9 mm peek interbody cage was selected and packed with remaining autograft. The graft was tamped into position and countersunk appropriately.  Hemostasis was assured.  Distraction weight was removed. A 62 mm Archon R  anterior cervical plate was affixed to the cervical spine with 13 mm variable-angle screws 3 at C4 (one 15 mm in length), 2 at C5, 2 at C6,  and 3 at C7. All screws were well-positioned and locking mechanisms were engaged. Soft tissues were inspected and found  to be in good repair. The wound was irrigated. A final x-ray was obtained with good visualization at the operated levels with the interbody grafts well visualized. A #10 JP drain was placed through a separate stab incision. The platysma layer was closed with 3-0 Vicryl stitches and the skin was reapproximated with 3-0 Vicryl subcuticular stitches. The wound was dressed with Dermabond. Counts were correct at the end of the case. Patient was extubated and taken to recovery in stable and satisfactory condition.    PLAN OF CARE: Admit to inpatient   PATIENT DISPOSITION:  PACU - hemodynamically stable.   Delay start of Pharmacological VTE agent (>24hrs) due to surgical blood loss or risk of bleeding: yes

## 2017-03-07 NOTE — Brief Op Note (Signed)
03/07/2017  10:31 AM  PATIENT:  Derrick Willis  56 y.o. 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, OPLL, cord signal    PROCEDURE:  Procedure(s) with comments: Cervical four-five, Cervical five-six, Cerivcal six- seven Anterior cervical decompression/discectomy/fusion WITH CERVICAL FIVE COREPECTOMY (N/A) - C4-5 C5-6 C6-7 Anterior cervical decompression/discectomy/fusion with PEEK cage C 67 and anterior cervical plating C 4 - C 7 levels  SURGEON:  Surgeon(s) and Role:    Erline Levine, MD - Primary    * Consuella Lose, MD - Assisting  PHYSICIAN ASSISTANT:   ASSISTANTS: Poteat, RN   ANESTHESIA:   general  EBL:  Total I/O In: 1000 [I.V.:1000] Out: 150 [Blood:150]  BLOOD ADMINISTERED:none  DRAINS: (10) Jackson-Pratt drain(s) with closed bulb suction in the prevertebral space   LOCAL MEDICATIONS USED:  MARCAINE    and LIDOCAINE   SPECIMEN:  No Specimen  DISPOSITION OF SPECIMEN:  N/A  COUNTS:  YES  TOURNIQUET:  * No tourniquets in log *  DICTATION: DICTATION: Patient was brought to operating room and following the smooth and uncomplicated induction of general endotracheal anesthesia his head was placed on a horseshoe head holder he was placed in 5 pounds of Holter traction and his anterior neck was prepped and draped in usual sterile fashion. C arm was used for localization throughout the surgery.  An incision was made on the left side of midline after infiltrating the skin and subcutaneous tissues with local lidocaine. The platysmal layer was incised and subplatysmal dissection was performed exposing the anterior border sternocleidomastoid muscle. Using blunt dissection the carotid sheath was kept lateral and trachea and esophagus kept medial exposing the anterior cervical  spine. A bent spinal needle was placed it was felt to be the  C5-6 levels and this was confirmed on intraoperative x-ray. Longus coli muscles were taken down from the anterior cervical spine using electrocautery and key elevator and self-retaining retractor was placed. The interspaces at C 45, C 56 and C 67 were incised and a thorough discectomy was performed. Distraction pins were placed in C 4 and C 6 levels and a corpectomy of C 5 was performed with removal of herniated disc behind the midpoint of the C 5 vertebral body.  This was calcified and appeared consistent with OPLL.  Uncinate spurs and central spondylitic ridges were drilled down with a high-speed drill. The spinal cord dura and both C 4, C 5, C 6  nerve roots were widely decompressed along with the spinal cord dura. A Monolith Nuvasive corpectomy cage  27 mm in height x 15 mm wide x 13 mm deep with 7 degrees lordosis was packed with corpectomy bone and tamped into position after hemostasis had been assured.  Positioning of implant was confirmed with C arm. At the C 67 level, the interspace was thoroughly decompressed and discectomy was performed after placing distraction pins at  C6 and C 7 vertebrae, with removal of herniated disc and decompression of the cervical spinal cord dura.  Uncinate spurs and central spondylitic ridges were drilled down with a high-speed drill. The spinal cord dura and both C7 nerve roots were widely decompressed. Hemostasis was assured. After trial sizing a 9 mm peek interbody cage was selected and packed with remaining autograft. The graft was tamped into position and countersunk appropriately.  Hemostasis was assured.  Distraction weight was removed. A 62 mm Archon R  anterior cervical plate was affixed to the cervical spine with 13 mm variable-angle screws 3 at C4 (one 15 mm in length), 2 at C5, 2 at C6,  and 3 at C7. All screws were well-positioned and locking mechanisms were engaged. Soft tissues were inspected and found  to be in good repair. The wound was irrigated. A final x-ray was obtained with good visualization at the operated levels with the interbody grafts well visualized. A #10 JP drain was placed through a separate stab incision. The platysma layer was closed with 3-0 Vicryl stitches and the skin was reapproximated with 3-0 Vicryl subcuticular stitches. The wound was dressed with Dermabond. Counts were correct at the end of the case. Patient was extubated and taken to recovery in stable and satisfactory condition.    PLAN OF CARE: Admit to inpatient   PATIENT DISPOSITION:  PACU - hemodynamically stable.   Delay start of Pharmacological VTE agent (>24hrs) due to surgical blood loss or risk of bleeding: yes

## 2017-03-07 NOTE — Progress Notes (Signed)
Awake, alert, conversant.  MAEW with full bilateral D/B/T.  Mild weakness in both hands, left greater than right.  Leg strength full bilaterally.  Doing well.

## 2017-03-07 NOTE — Anesthesia Procedure Notes (Signed)
Procedure Name: Intubation Date/Time: 03/07/2017 7:39 AM Performed by: Gaylene Brooks Pre-anesthesia Checklist: Patient identified, Emergency Drugs available, Suction available and Patient being monitored Patient Re-evaluated:Patient Re-evaluated prior to induction Oxygen Delivery Method: Circle System Utilized Preoxygenation: Pre-oxygenation with 100% oxygen Induction Type: IV induction Ventilation: Mask ventilation without difficulty Laryngoscope Size: Glidescope and 3 Grade View: Grade I Tube type: Oral Tube size: 7.5 mm Number of attempts: 1 Airway Equipment and Method: Stylet,  Oral airway and Video-laryngoscopy Placement Confirmation: ETT inserted through vocal cords under direct vision,  positive ETCO2 and breath sounds checked- equal and bilateral Secured at: 22 cm Tube secured with: Tape Dental Injury: Teeth and Oropharynx as per pre-operative assessment  Comments: Glidescope used at Dr. Melven Sartorius request due to cervical disease. Neck was maintained in neutral spine throughout induction/intubation.

## 2017-03-07 NOTE — H&P (Signed)
Patient ID:   [id number] Patient: Derrick Willis  Date of Birth: Nov 20, 1960 Visit Type: Office Visit   Date: 02/28/2017 03:00 PM Provider: Marchia Meiers. Vertell Limber MD   This 56 year old male presents for pain.   History of Present Illness: 1.  pain  Pt visits to discuss MRI   The patient returns today and he is noticing significant worsening in his hand numbness bilaterally as well as he is beginning to notice some difficulty with walking and he appears to have a spastic gait in his lower extremities with  hyperreflexia which is new.    On repeat examination today he also has a positive Hoffmann sign on the right.  He is starting to develop some bilateral hand  intrinsic weakness.    His MRI of his cervical spine is surprisingly bad in that shows a severe disc herniation with severe spinal stenosis and cord signal abnormality at the C4-5, C5-6  , C6-7 levels.   He has significant stenosis.  The large disc herniation  at the C4-5 level appears to be consistent with spur as well as soft disc material and extends behind the vertebral body  to the midpoint of the C5 vertebral body.  Because of the severity of the stenosis as well as the difficulty I had with his decompressive laminectomy in which he appeared to be developing 0PLL,  I am quite concerned that the risk of simple diskectomy would be great and I have therefore recommended performing a cervical corpectomy at the C5 level.  I do think he needs to undergo expedited surgery and this will consist of corpectomy of C5 with anterior cervical decompression and fusion C4-C7 levels with an anterior cervical decompression and fusion at the C6-7 level as well because   of the severity of his cord compression.  I have recommended that this be done expedited basis plan  on doing it later  this coming week.   Patient is aware of the risks and benefits  of surgery and wishes to proceed.         Medical/Surgical/Interim History Reviewed, no change.  Last  detailed document date:10/23/2015.     PAST MEDICAL HISTORY, SURGICAL HISTORY, FAMILY HISTORY, SOCIAL HISTORY AND REVIEW OF SYSTEMS I have reviewed the patient's past medical, surgical, family and social history as well as the comprehensive review of systems as included on the [facility] history form dated 01/01/2017, which I have signed.  Family History: Reviewed, no changes.  Last detailed document date:10/23/2015.   Social History: Reviewed, no changes. Last detailed document date: 10/23/2015.    MEDICATIONS(added, continued or stopped this visit): Started Medication Directions Instruction Stopped   allopurinol 300 mg tablet take 0.5 tablet by oral route  every day     aspirin 81 mg tablet,delayed release take 1 tablet by oral route  every day     Cholest Off 450 mg tablet take 1 tablet daily     hydrochlorothiazide 25 mg tablet take 1 tablet by oral route  every day     lisinopril 5 mg tablet take 1 tablet by oral route  every day     metformin 500 mg tablet take 1 tablet by oral route 2 times every day with morning and evening meals    02/10/2017 methocarbamol 500 mg tablet take 1 tablet by oral route 4 times every day as needed       ALLERGIES: Ingredient Reaction Medication Name Comment  NO KNOWN ALLERGIES  No known allergies. Reviewed, no changes.    Vitals Date Temp F BP Pulse Ht In Wt Lb BMI BSA Pain Score  02/28/2017  151/87 51 65 193.6 32.22  1/10      IMPRESSION Severe cervical spinal cord compression with  cervical myelopathy  Completed Orders (this encounter) Order Details Reason Side Interpretation Result Initial Treatment Date Region  Hypertension education Patient to follow up with primary care provider.        Dietary management education, guidance, and counseling patient encouraged to eat a well balanced diet         Assessment/Plan # Detail Type Description   1. Assessment Cervical myelopathy (G95.9).       2.  Assessment Spinal stenosis, cervical region (M48.02).       3. Assessment HNP (herniated nucleus pulposus), cervical (M50.20).       4. Assessment Essential (primary) hypertension (I10).       5. Assessment Body mass index (BMI) 32.0-32.9, adult (Q68.34).   Plan Orders Today's instructions / counseling include(s) Dietary management education, guidance, and counseling.           Pain Management Plan Pain Scale: 1/10. Method: Numeric Pain Intensity Scale. Location: neck. Onset: 04/22/2015. Duration: varies. Quality: discomforting. Pain management follow-up plan of care: Patient taking medication as prescribed..  Fall Risk Plan The patient has fallen 5 times in the last year.  Falls risk follow-up plan of care: Assisted devices: Advise to use safety measures when available.   anterior cervical corpectomy of C5 with anterior cervical  diskectomy at C  6 7 and plating  from C4-C7 levels.   This will be done on an expedited basis.  Orders: Instruction(s)/Education: Assessment Instruction  I10 Hypertension education  [phone] Dietary management education, guidance, and counseling             Provider:  Vertell Limber MD, Marchia Meiers 03/03/2017 9:09 AM  Dictation edited by: Marchia Meiers. Vertell Limber    CC Providers: Shawnie Dapper [address] [place],  [place]  [id number]-   Rosser Collington MD  [address] [place], [place] [address]              Electronically signed by Marchia Meiers. Vertell Limber MD on 03/03/2017 09:10 AM

## 2017-03-07 NOTE — Interval H&P Note (Signed)
History and Physical Interval Note:  03/07/2017 7:22 AM  Derrick Willis  has presented today for surgery, with the diagnosis of Cervical herniated disc  The various methods of treatment have been discussed with the patient and family. After consideration of risks, benefits and other options for treatment, the patient has consented to  Procedure(s) with comments: C4-5 C5-6 C6-7 Anterior cervical decompression/discectomy/fusion WITH C-5 COREPECTOMY (N/A) - C4-5 C5-6 C6-7 Anterior cervical decompression/discectomy/fusion as a surgical intervention .  The patient's history has been reviewed, patient examined, no change in status, stable for surgery.  I have reviewed the patient's chart and labs.  Questions were answered to the patient's satisfaction.     [name] D

## 2017-03-07 NOTE — Evaluation (Addendum)
Physical Therapy Evaluation Patient Details Name: Derrick Willis MRN: [id number] DOB: [birth year] Today's Date: 03/07/2017   History of Present Illness  Patient is a 57 y/o male who presents s/p C4-5, 5-6, 6-7 ACDF with corpectomy. PMH includes HTN, HLD, gout, DM, back surgery.  Clinical Impression  Patient presents with post surgical deficits s/p above surgery. Tolerated gait training with min A for balance/safety. Used IV pole for support- most likely will need RW as pt lives alone. Education re: cervical precautions, brace. Reports multiple falls at home. Reports improved UE strength and LE strength today. Plan for stair training tomorrow as tolerated. Will follow acutely to maximize independence and mobility prior to return home.     Follow Up Recommendations No PT follow up;Supervision for mobility/OOB    Equipment Recommendations  Rolling walker with 5" wheels    Recommendations for Other Services       Precautions / Restrictions Precautions Precautions: Cervical;Fall Precaution Comments: hemovac Required Braces or Orthoses: Cervical Brace Cervical Brace: Hard collar;At all times Restrictions Weight Bearing Restrictions: No      Mobility  Bed Mobility Overal bed mobility: Needs Assistance Bed Mobility: Sit to Sidelying Rolling: Modified independent (Device/Increase time) Sidelying to sit: Modified independent (Device/Increase time);HOB elevated     Sit to sidelying: Modified independent (Device/Increase time);HOB elevated General bed mobility comments: HOB 20 degrees, good demo of log roll technique. Use of rail. No dizziness.   Transfers Overall transfer level: Needs assistance Equipment used: None Transfers: Sit to/from Stand Sit to Stand: Min guard         General transfer comment: Min guard for safety. Min A to initially steady reaching for IV pole. Stood from [employer], from toilet x1.  Ambulation/Gait Ambulation/Gait assistance: Min guard Ambulation  Distance (Feet): 300 Feet Assistive device:  (IV pole) Gait Pattern/deviations: Step-through pattern;Decreased stride length;Ataxic Gait velocity: decreased Gait velocity interpretation: Below normal speed for age/gender General Gait Details: Mild ataxia initially but this improved with distance. Requires UE support for balance- may benefit from RW.   Stairs            Wheelchair Mobility    Modified Rankin (Stroke Patients Only)       Balance Overall balance assessment: Needs assistance Sitting-balance support: Feet supported;No upper extremity supported Sitting balance-Leahy Scale: Good     Standing balance support: During functional activity Standing balance-Leahy Scale: Fair Standing balance comment: Able to wash hands at sink and reach outside BoS without LOB.                             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 Multiple falls.      Hand Dominance   Dominant Hand: Right    Extremity/Trunk Assessment   Upper Extremity Assessment Upper Extremity Assessment: Defer to OT evaluation;RUE deficits/detail;LUE deficits/detail RUE Deficits / Details: Weak grip but functional as pt observed opening peanut butter container LUE Deficits / Details: Weak grip    Lower Extremity Assessment Lower  Extremity Assessment: Generalized weakness    Cervical / Trunk Assessment Cervical / Trunk Assessment: Other  exceptions Cervical / Trunk Exceptions: s/p spine surgery  Communication   Communication: No difficulties  Cognition Arousal/Alertness: Awake/alert Behavior During Therapy: WFL for tasks assessed/performed Overall Cognitive Status: Within Functional Limits for tasks assessed                                        General Comments      Exercises     Assessment/Plan    PT Assessment Patient needs continued PT services  PT Problem List Decreased strength;Decreased balance;Decreased knowledge of use of DME       PT Treatment Interventions Therapeutic activities;DME instruction;Gait training;Therapeutic exercise;Patient/family education;Balance training;Stair training;Functional mobility training    PT Goals (Current goals can be found in the Care Plan section)  Acute Rehab PT Goals Patient Stated Goal: to get back to hiking PT Goal Formulation: With patient Time For Goal Achievement: 03/21/17 Potential to Achieve Goals: Good    Frequency Min 5X/week   Barriers to discharge Decreased caregiver support;Inaccessible home environment alone and has stairs    Co-evaluation               AM-PAC PT "6 Clicks" Daily Activity  Outcome Measure Difficulty turning over in bed (including adjusting bedclothes, sheets and blankets)?: None Difficulty moving from lying on back to sitting on the side of the bed? : None Difficulty sitting down on and standing up from a chair with arms (e.g., wheelchair, bedside commode, etc,.)?: None Help needed moving to and from a bed to chair (including a wheelchair)?: None Help needed walking in hospital room?: A Little Help needed climbing 3-5 steps with a railing? : A Little 6 Click Score: 22    End of Session Equipment Utilized During Treatment: Cervical collar;Gait belt Activity Tolerance: Patient tolerated treatment well Patient left: in bed;with call bell/phone within reach Nurse Communication: Mobility status PT Visit  Diagnosis: Muscle weakness (generalized) (M62.81);Unsteadiness on feet (R26.81);Other abnormalities of gait and mobility (R26.89)    Time: 1456 (1530-1541- 22 mins)-1508 PT Time Calculation (min) (ACUTE ONLY): 12 min   Charges:   PT Evaluation $PT Eval Low Complexity: 1 Procedure     PT G Codes:        Wray Kearns, PT, DPT [phone]    Marguarite Arbour A Jeoffrey Eleazer 03/07/2017, 3:48 PM

## 2017-03-07 NOTE — Anesthesia Preprocedure Evaluation (Signed)
Anesthesia Evaluation  Patient identified by MRN, date of birth, ID band  Reviewed: Allergy & Precautions, NPO status , Patient's Chart, lab work & pertinent test results  Airway Mallampati: I       Dental no notable dental hx. (+) Teeth Intact   Pulmonary former smoker,    Pulmonary exam normal        Cardiovascular hypertension, Pt. on medications Normal cardiovascular exam Rhythm:Regular Rate:Normal     Neuro/Psych negative psych ROS   GI/Hepatic negative GI ROS, Neg liver ROS,   Endo/Other  diabetes, Type 2, Oral Hypoglycemic Agents  Renal/GU negative Renal ROS     Musculoskeletal negative musculoskeletal ROS (+)   Abdominal (+) + obese,   Peds  Hematology negative hematology ROS (+)   Anesthesia Other Findings   Reproductive/Obstetrics                             Anesthesia Physical Anesthesia Plan  ASA: II  Anesthesia Plan: General   Post-op Pain Management:    Induction:   PONV Risk Score and Plan: 2 and Ondansetron and Dexamethasone  Airway Management Planned:   Additional Equipment:   Intra-op Plan:   Post-operative Plan:   Informed Consent: I have reviewed the patients History and Physical, chart, labs and discussed the procedure including the risks, benefits and alternatives for the proposed anesthesia with the patient or authorized representative who has indicated his/her understanding and acceptance.   Dental advisory given  Plan Discussed with: CRNA and Surgeon  Anesthesia Plan Comments:         Anesthesia Quick Evaluation

## 2017-03-08 DIAGNOSIS — Z7982 Long term (current) use of aspirin: Secondary | ICD-10-CM | POA: Diagnosis not present

## 2017-03-08 DIAGNOSIS — Z87891 Personal history of nicotine dependence: Secondary | ICD-10-CM | POA: Diagnosis not present

## 2017-03-08 DIAGNOSIS — M50021 Cervical disc disorder at C4-C5 level with myelopathy: Secondary | ICD-10-CM | POA: Diagnosis not present

## 2017-03-08 DIAGNOSIS — Z79899 Other long term (current) drug therapy: Secondary | ICD-10-CM | POA: Diagnosis not present

## 2017-03-08 DIAGNOSIS — M4802 Spinal stenosis, cervical region: Secondary | ICD-10-CM | POA: Diagnosis not present

## 2017-03-08 DIAGNOSIS — M488X2 Other specified spondylopathies, cervical region: Secondary | ICD-10-CM | POA: Diagnosis not present

## 2017-03-08 DIAGNOSIS — Z7984 Long term (current) use of oral hypoglycemic drugs: Secondary | ICD-10-CM | POA: Diagnosis not present

## 2017-03-08 DIAGNOSIS — I10 Essential (primary) hypertension: Secondary | ICD-10-CM | POA: Diagnosis not present

## 2017-03-08 DIAGNOSIS — Z9181 History of falling: Secondary | ICD-10-CM | POA: Diagnosis not present

## 2017-03-08 DIAGNOSIS — E119 Type 2 diabetes mellitus without complications: Secondary | ICD-10-CM | POA: Diagnosis not present

## 2017-03-08 LAB — GLUCOSE, CAPILLARY: GLUCOSE-CAPILLARY: 194 mg/dL — AB (ref 65–99)

## 2017-03-08 MED ORDER — OXYCODONE HCL 5 MG PO TABS: 5.0000 mg | ORAL_TABLET | ORAL | 0 refills | Status: DC | PRN

## 2017-03-08 MED ORDER — METHOCARBAMOL 500 MG PO TABS: 500.0000 mg | ORAL_TABLET | Freq: Four times a day (QID) | ORAL | 1 refills | Status: DC | PRN

## 2017-03-08 NOTE — Progress Notes (Signed)
Patient is discharged from room 3C02 at this time. Alert and in stable condition. IV site d/c'd and instructions read to patient with understanding verbalized. Left unit via wheelchair with all belongings at side. 

## 2017-03-08 NOTE — Evaluation (Addendum)
Occupational Therapy Evaluation Patient Details Name: Derrick Willis MRN: [id number] DOB: Jun 02, 1961 Today's Date: 03/08/2017    History of Present Illness Patient is a 56 y/o male who presents s/p C4-5, 5-6, 6-7 ACDF with corpectomy. PMH includes HTN, HLD, gout, DM, back surgery.   Clinical Impression   Patient evaluated by Occupational Therapy with no further acute OT needs identified. All education has been completed and the patient has no further questions. See below for any follow-up Occupational Therapy or equipment needs. OT to sign off. Thank you for referral.  .    Follow Up Recommendations  No OT follow up;DC plan and follow up therapy as arranged by surgeon    Equipment Recommendations  None recommended by OT    Recommendations for Other Services       Precautions / Restrictions Precautions Precautions: Cervical;Fall Precaution Comments: hemovac Required Braces or Orthoses: Cervical Brace Cervical Brace: Hard collar;At all times      Mobility Bed Mobility Overal bed mobility: Independent                Transfers Overall transfer level: Independent                    Balance           Standing balance support: Single extremity supported;During functional activity Standing balance-Leahy Scale: Fair Standing balance comment: pt requires UE support for any single leg isolated standing such as tub transfer                           ADL either performed or assessed with clinical judgement   ADL Overall ADL's : Modified independent                                       General ADL Comments: Pt educated on don doff brace with good return demo. pt able to cross bil LE . pt does not transfer over tub without min (A)> recommended that brother (A) with any tub transfers and that he is to sponge bath until. pt fall risk with tub transfers and reports multiple fall in bathroom. pt has access to a seat for tub and  recommended he use it .  Cervical precautions ( handout provided): Educated patient on don doff brace with return demonstration, educated on oral care using cups, washing face with cloth, never to wash directly on incision site, avoid neck rotation flexion and extension, positioning with pillows in chair for bil UE, sleeping positioning, avoiding pushing / pulling with bil UE, Pt educated on need to notify doctor / RN of swallowing changes or choking..  Pt educated on bathing and avoid washing directly on incision. Pt educated to use new wash cloth and towel each day. Pt educated to allow water to run across dressing and not to soak in a tub at this time. Pt advised RN will instruct on any bandages required otherwise is open to air.      Vision Baseline Vision/History: Wears glasses Wears Glasses: Reading only       Perception     Praxis      Pertinent Vitals/Pain Pain Assessment: No/denies pain     Hand Dominance Right   Extremity/Trunk Assessment Upper Extremity Assessment Upper Extremity Assessment: Generalized weakness   Lower Extremity Assessment Lower Extremity Assessment: Overall WFL for tasks assessed   Cervical /  Trunk Assessment Cervical / Trunk Assessment: Other exceptions Cervical / Trunk Exceptions: s/p spine surgery   Communication Communication Communication: No difficulties   Cognition Arousal/Alertness: Awake/alert Behavior During Therapy: WFL for tasks assessed/performed Overall Cognitive Status: Within Functional Limits for tasks assessed                                     General Comments  dressing intact and dry at this time    Exercises     Shoulder Instructions      Home Living Family/patient expects to be discharged to:: Private residence Living Arrangements: Alone Available Help at Discharge: Family;Available PRN/intermittently Type of Home: House Home Access: Stairs to enter [employer] of Steps: 3 Entrance  Stairs-Rails: None Home Layout: One level     Bathroom Shower/Tub: Teacher, early years/pre: Standard     Home Equipment: Crutches;Other (comment)   Additional Comments: pt has dog at home but brother will (A) initially with dog.       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 Multiple falls.         OT Problem List:        OT Treatment/Interventions:      OT Goals(Current goals can be found in the care plan section) Acute Rehab OT Goals Patient Stated Goal: to get back to hiking  OT Frequency:     Barriers to D/C:            Co-evaluation              AM-PAC PT "6 Clicks" Daily Activity     Outcome Measure Help from another person eating meals?: None Help from another person taking care of personal grooming?: None Help from another person toileting, which includes using toliet, bedpan, or urinal?: None Help from another person bathing (including washing, rinsing, drying)?: None Help from another person to put on and taking off regular upper body clothing?: None Help from another person to put on and taking off regular lower body clothing?: None 6 Click Score: 24   End of Session Equipment Utilized During Treatment: Cervical collar Nurse Communication: Mobility status;Precautions  Activity Tolerance: Patient tolerated treatment well Patient left: in bed;with call bell/phone within reach;Other (comment) (PT Devon arriving to ambulate)  OT Visit Diagnosis: Unsteadiness on feet (R26.81)                Time: 0881-1031 OT Time Calculation (min): 20 min Charges:  OT General Charges $OT Visit: 1 Procedure OT Evaluation $OT Eval Moderate Complexity: 1 Procedure G-Codes:      Jeri Modena   OTR/L Pager: [phone] Office: [phone] .   Parke Poisson B 03/08/2017, 1:17 PM

## 2017-03-08 NOTE — Discharge Summary (Signed)
Physician Discharge Summary  Patient ID: Derrick Willis MRN: [id number] DOB/AGE: 56/02/1961 56 y.o.  Admit date: 03/07/2017 Discharge date: 03/08/2017  Admission Diagnoses: cervical stenosis    Discharge Diagnoses: same   Discharged Condition: good  Hospital Course: The patient was admitted on 03/07/2017 and taken to the operating room where the patient underwent anterior cervical decompresion. The patient tolerated the procedure well and was taken to the recovery room and then to the floor in stable condition. The hospital course was routine. There were no complications. The wound remained clean dry and intact. Pt had appropriate neck soreness. No complaints of arm pain or new N/T/W. The patient remained afebrile with stable vital signs, and tolerated a regular diet. The patient continued to increase activities, and pain was well controlled with oral pain medications.   Consults: None  Significant Diagnostic Studies:  Results for orders placed or performed during the hospital encounter of 03/07/17  Glucose, capillary  Result Value Ref Range   Glucose-Capillary 144 (H) 65 - 99 mg/dL   Comment 1 Notify RN    Comment 2 Document in Chart   Glucose, capillary  Result Value Ref Range   Glucose-Capillary 172 (H) 65 - 99 mg/dL   Comment 1 Notify RN   Glucose, capillary  Result Value Ref Range   Glucose-Capillary 315 (H) 65 - 99 mg/dL  Glucose, capillary  Result Value Ref Range   Glucose-Capillary 225 (H) 65 - 99 mg/dL   Comment 1 Notify RN    Comment 2 Document in Chart   Glucose, capillary  Result Value Ref Range   Glucose-Capillary 194 (H) 65 - 99 mg/dL    Dg Cervical Spine 2-3 Views  Result Date: 03/07/2017 CLINICAL DATA:  C5 corpectomy with fusion C4-C7 EXAM: DG C-ARM 61-120 MIN; CERVICAL SPINE - 2-3 VIEW COMPARISON:  02/10/2017 FINDINGS: Changes of C5 corpectomy and fusion from C4-C6 with anterior plate. The C6 and C7 levels difficult to visualize due to overlying  shoulders. No visible complicating feature. IMPRESSION: C5 corpectomy and fusion C4-C7. Electronically Signed   By: Rolm Baptise M.D.   On: 03/07/2017 11:47   Dg C-arm 1-60 Min  Result Date: 03/07/2017 CLINICAL DATA:  C5 corpectomy with fusion C4-C7 EXAM: DG C-ARM 61-120 MIN; CERVICAL SPINE - 2-3 VIEW COMPARISON:  02/10/2017 FINDINGS: Changes of C5 corpectomy and fusion from C4-C6 with anterior plate. The C6 and C7 levels difficult to visualize due to overlying shoulders. No visible complicating feature. IMPRESSION: C5 corpectomy and fusion C4-C7. Electronically Signed   By: Rolm Baptise M.D.   On: 03/07/2017 11:47    Antibiotics:  Anti-infectives    Start     Dose/Rate Route Frequency Ordered Stop   03/07/17 1600  ceFAZolin (ANCEF) IVPB 2g/100 mL premix     2 g 200 mL/hr over 30 Minutes Intravenous Every 8 hours 03/07/17 1223 03/07/17 2354   03/07/17 0600  ceFAZolin (ANCEF) IVPB 2g/100 mL premix     2 g 200 mL/hr over 30 Minutes Intravenous On call to O.R. 03/07/17 0551 03/07/17 0755   03/07/17 0553  ceFAZolin (ANCEF) 2-4 GM/100ML-% IVPB    Comments:  Starleen Arms   : cabinet override      03/07/17 0553 03/07/17 1759      Discharge Exam: Blood pressure 122/69, pulse 63, temperature 97.9 F (36.6 C), temperature source Oral, resp. rate 18, height 5\' 5"  (1.651 m), weight 84.8 kg (187 lb), SpO2 98 %. Neurologic: Grossly normal Dressing dry  Discharge Medications:   Allergies  as of 03/08/2017      Reactions   Prednisone Other (See Comments)   Headaches, muscle aches, fatigue      Medication List    TAKE these medications   allopurinol 300 MG tablet Commonly known as:  ZYLOPRIM TAKE ONE TABLET BY MOUTH ONCE DAILY What changed:  See the new instructions.   aspirin EC 81 MG tablet Take 81 mg by mouth daily.   CHOLESTOFF PLUS PO Take 1 tablet by mouth daily.   hydrochlorothiazide 25 MG tablet Commonly known as:  HYDRODIURIL TAKE ONE TABLET BY MOUTH ONCE DAILY    ibuprofen 200 MG tablet Commonly known as:  ADVIL,MOTRIN Take 400 mg by mouth every 6 (six) hours as needed (for pain relief (typically with methocarbamol)).   lisinopril 5 MG tablet Commonly known as:  PRINIVIL,ZESTRIL TAKE ONE TABLET BY MOUTH ONCE DAILY   metFORMIN 500 MG tablet Commonly known as:  GLUCOPHAGE TAKE ONE TABLET BY MOUTH TWICE DAILY WITH MEALS   methocarbamol 500 MG tablet Commonly known as:  ROBAXIN Take 1 tablet (500 mg total) by mouth 4 (four) times daily as needed. For muscle spasms.   multivitamin with minerals Tabs tablet Take 1 tablet by mouth daily. Men's 50+   oxyCODONE 5 MG immediate release tablet Commonly known as:  Oxy IR/ROXICODONE Take 1 tablet (5 mg total) by mouth every 4 (four) hours as needed for moderate pain or breakthrough pain.   SYSTANE 0.4-0.3 % Soln Generic drug:  Polyethyl Glycol-Propyl Glycol Place 1-2 drops into both eyes 4 (four) times daily as needed (for dry eyes.).   URINOZINC PLUS PO Take 1 tablet by mouth daily.       Disposition: home   Final Dx: cervical decompression  Discharge Instructions    Call MD for:  difficulty breathing, headache or visual disturbances    Complete by:  As directed    Call MD for:  persistant nausea and vomiting    Complete by:  As directed    Call MD for:  redness, tenderness, or signs of infection (pain, swelling, redness, odor or green/yellow discharge around incision site)    Complete by:  As directed    Call MD for:  severe uncontrolled pain    Complete by:  As directed    Call MD for:  temperature >100.4    Complete by:  As directed    Diet - low sodium heart healthy    Complete by:  As directed    Increase activity slowly    Complete by:  As directed    Remove dressing in 48 hours    Complete by:  As directed          Signed: Isabeau Mccalla S 03/08/2017, 8:59 AM

## 2017-03-08 NOTE — Care Management Note (Signed)
Case Management Note  Patient Details  Name: Derrick Willis MRN: [id number] Date of Birth: 13-Oct-1960  Subjective/Objective:                 Patient with order to DC to home today. Chart reviewed. No Home Health or Equipment needs, no unacknowledged Case Management consults or medication needs identified at the time of this note. Plan for DC to home. If needs arise today prior to discharge, please call Carles Collet RN CM at [phone].    Action/Plan:   Expected Discharge Date:  03/08/17               Expected Discharge Plan:  Home/Self Care  In-House Referral:     Discharge planning Services  CM Consult  Post Acute Care Choice:  Durable Medical Equipment Choice offered to:     DME Arranged:  3-N-1 DME Agency:   (From Unit stock, provided by staff RN if needed)  HH Arranged:    [place] Agency:     Status of Service:     If discussed at [employer], dates discussed:    Additional Comments:  Carles Collet, RN 03/08/2017, 9:37 AM

## 2017-03-08 NOTE — Progress Notes (Signed)
Physical Therapy Treatment and Discharge Patient Details Name: Derrick Willis MRN: [id number] DOB: 05-Jun-1961 Today's Date: 03/08/2017    History of Present Illness Patient is a 56 y/o male who presents s/p C4-5, 5-6, 6-7 ACDF with corpectomy. PMH includes HTN, HLD, gout, DM, back surgery.    PT Comments    Patient seen for mobility progress s/p cervical spine surgery. Mobilizing well today without assistive device and performed full flight of stair negotiation without difficulty. Educated patient on precautions, mobility expectations, safety and car transfers. No further acute PT needs. Will sign off.    Follow Up Recommendations  No PT follow up;Supervision for mobility/OOB     Equipment Recommendations  None recommended by PT    Recommendations for Other Services       Precautions / Restrictions Precautions Precautions: Cervical;Fall Precaution Comments: hemovac Required Braces or Orthoses: Cervical Brace Cervical Brace: Hard collar;At all times    Mobility  Bed Mobility               General bed mobility comments: received at EOb from OT  Transfers Overall transfer level: Modified independent Equipment used: None Transfers: Sit to/from Stand Sit to Stand: Modified independent (Device/Increase time)         General transfer comment: increased time to perform, no physical assist or cues required  Ambulation/Gait Ambulation/Gait assistance: Supervision Ambulation Distance (Feet): 360 Feet Assistive device: None Gait Pattern/deviations: Step-through pattern;Decreased stride length;Ataxic Gait velocity: decreased Gait velocity interpretation: Below normal speed for age/gender General Gait Details: modest instability noted, no physical assist required. One standing rest break   Stairs Stairs: Yes   Stair Management: One rail Left Number of Stairs: 12 General stair comments: supervision for safety, VCs for technique and sequencing. No physical assist  required  Wheelchair Mobility    Modified Rankin (Stroke Patients Only)       Balance Overall balance assessment: Needs assistance Sitting-balance support: Feet supported;No upper extremity supported Sitting balance-Leahy Scale: Good     Standing balance support: During functional activity Standing balance-Leahy Scale: Fair Standing balance comment: able to perform in room tasks without assist or support                            Cognition Arousal/Alertness: Awake/alert Behavior During Therapy: WFL for tasks assessed/performed Overall Cognitive Status: Within Functional Limits for tasks assessed                                        Exercises      General Comments General comments (skin integrity, edema, etc.): educated on car transfer technique      Pertinent Vitals/Pain      Home Living                      Prior Function            PT Goals (current goals can now be found in the care plan section) Acute Rehab PT Goals Patient Stated Goal: to get back to hiking PT Goal Formulation: With patient Time For Goal Achievement: 03/21/17 Potential to Achieve Goals: Good Progress towards PT goals: Progressing toward goals    Frequency    Min 5X/week      PT Plan Current plan remains appropriate    Co-evaluation  AM-PAC PT "6 Clicks" Daily Activity  Outcome Measure  Difficulty turning over in bed (including adjusting bedclothes, sheets and blankets)?: None Difficulty moving from lying on back to sitting on the side of the bed? : None Difficulty sitting down on and standing up from a chair with arms (e.g., wheelchair, bedside commode, etc,.)?: None Help needed moving to and from a bed to chair (including a wheelchair)?: None Help needed walking in hospital room?: A Little Help needed climbing 3-5 steps with a railing? : A Little 6 Click Score: 22    End of Session Equipment Utilized During  Treatment: Cervical collar;Gait belt Activity Tolerance: Patient tolerated treatment well Patient left: in bed;with call bell/phone within reach Nurse Communication: Mobility status PT Visit Diagnosis: Muscle weakness (generalized) (M62.81);Unsteadiness on feet (R26.81);Other abnormalities of gait and mobility (R26.89)     Time: 7195-9747 PT Time Calculation (min) (ACUTE ONLY): 16 min  Charges:  $Gait Training: 8-22 mins                    G Codes:       Alben Deeds, PT DPT NCS [phone]    Duncan Dull 03/08/2017, 8:36 AM

## 2017-03-10 ENCOUNTER — Encounter (HOSPITAL_COMMUNITY): Payer: Self-pay | Admitting: Neurosurgery

## 2017-03-10 MED FILL — Gelatin Absorbable MT Powder: OROMUCOSAL | Qty: 1 | Status: AC

## 2017-03-10 MED FILL — Thrombin For Soln 5000 Unit: CUTANEOUS | Qty: 5000 | Status: AC

## 2017-03-12 NOTE — Anesthesia Postprocedure Evaluation (Signed)
Anesthesia Post Note  Patient: Derrick Willis  Procedure(s) Performed: Procedure(s) (LRB): Cervical four-five, Cervical five-six, Cerivcal six- seven Anterior cervical decompression/discectomy/fusion WITH CERVICAL FIVE COREPECTOMY (N/A)     Patient location during evaluation: PACU Anesthesia Type: General Level of consciousness: awake Pain management: pain level controlled Vital Signs Assessment: post-procedure vital signs reviewed and stable Respiratory status: spontaneous breathing Cardiovascular status: stable Postop Assessment: no signs of nausea or vomiting Anesthetic complications: no    Last Vitals:  Vitals:   03/08/17 0500 03/08/17 0722  BP: 123/66 122/69  Pulse: 65 63  Resp: 18 18  Temp: 36.7 C 36.6 C    Last Pain:  Vitals:   03/08/17 0947  TempSrc:   PainSc: 8    Pain Goal: Patients Stated Pain Goal: 3 (03/08/17 0456)               Rachelanne Whidby JR,JOHN [place] [facility]

## 2017-03-31 DIAGNOSIS — G959 Disease of spinal cord, unspecified: Secondary | ICD-10-CM | POA: Diagnosis not present

## 2017-04-30 ENCOUNTER — Other Ambulatory Visit: Payer: Self-pay | Admitting: Family Medicine

## 2017-04-30 NOTE — Telephone Encounter (Signed)
[facility] [place] 

## 2017-05-14 DIAGNOSIS — M4802 Spinal stenosis, cervical region: Secondary | ICD-10-CM | POA: Diagnosis not present

## 2017-05-30 ENCOUNTER — Encounter: Payer: Self-pay | Admitting: Family Medicine

## 2017-05-30 ENCOUNTER — Ambulatory Visit (INDEPENDENT_AMBULATORY_CARE_PROVIDER_SITE_OTHER): Payer: BLUE CROSS/BLUE SHIELD | Admitting: Family Medicine

## 2017-05-30 VITALS — BP 143/79 | HR 75 | Temp 98.3°F | Resp 16 | Ht 64.5 in | Wt 184.0 lb

## 2017-05-30 DIAGNOSIS — E7800 Pure hypercholesterolemia, unspecified: Secondary | ICD-10-CM

## 2017-05-30 DIAGNOSIS — E119 Type 2 diabetes mellitus without complications: Secondary | ICD-10-CM

## 2017-05-30 DIAGNOSIS — Z8639 Personal history of other endocrine, nutritional and metabolic disease: Secondary | ICD-10-CM

## 2017-05-30 DIAGNOSIS — I10 Essential (primary) hypertension: Secondary | ICD-10-CM | POA: Diagnosis not present

## 2017-05-30 LAB — BASIC METABOLIC PANEL
BUN: 18 mg/dL (ref 6–23)
CHLORIDE: 101 meq/L (ref 96–112)
CO2: 32 mEq/L (ref 19–32)
Calcium: 9.8 mg/dL (ref 8.4–10.5)
Creatinine, Ser: 0.97 mg/dL (ref 0.40–1.50)
GFR: 84.87 mL/min (ref >60.00)
Glucose, Bld: 155 mg/dL — ABNORMAL HIGH (ref 70–99)
POTASSIUM: 4.6 meq/L (ref 3.5–5.1)
Sodium: 139 mEq/L (ref 135–145)

## 2017-05-30 LAB — LIPID PANEL
CHOLESTEROL: 200 mg/dL (ref 0–200)
HDL: 51.4 mg/dL (ref >39.00)
LDL CALC: 116 mg/dL — AB (ref 0–99)
NonHDL: 148.52
TRIGLYCERIDES: 163 mg/dL — AB (ref 0.0–149.0)
Total CHOL/HDL Ratio: 4
VLDL: 32.6 mg/dL (ref 0.0–40.0)

## 2017-05-30 LAB — HEMOGLOBIN A1C: HEMOGLOBIN A1C: 6.2 % (ref 4.6–6.5)

## 2017-05-30 NOTE — Progress Notes (Signed)
OFFICE VISIT  05/30/2017   CC:  Chief Complaint  Patient presents with  . Follow-up    RCI, pt is fasting.     HPI:    Patient is a 56 y.o.  male who presents for 4 mo f/u DM 2, HTN, HLD, and hx of hyperkalemia. Got neck surgery since I last saw him: balance and pain better.  Numbness in hands not changed but he got put on gabapentin to help this.  Still not back to work yet.  DM 2: no home glucose monitoring.  Tries to watch diet somewhat, compliant with meds.  No polyuria or polydipsia.  HTN: home monitoring 125-135/70-80, HR usually 50s-60s.  HLD: TLC--walking avg of 2-5 miles per day.  Trying to adjust diet some.  ROS: no CP, No SOB, no myalgias/arthralgias, no HA's, no palpitations, no vision c/o, no feet complaints.  Past Medical History:  Diagnosis Date  . Asthma    "grew out of it", esp after quitting smoking arond age 22 yrs old  . Atypical chest pain 06/12/2014  . Blood in stool   . Carpal tunnel syndrome    bilat, L>>R (Dr. Vertell Limber).  Hand symptoms prompted referral to rheum--lab w/u NEG.  Wrist splinting x 6 wks advised, not much help so low dose prednisone rx'd, then pt had to get steroid injection into L carpal tunnel 12/2016.  . Diabetes mellitus without complication ([place])    No diab retinop as of 12/2014  . Gout    Knee and toe--allopurinol since approx 2012 and has had no flares since being on this med (1/2 of 300 mg tab qd)  . Hyperkalemia 2015/2016   low K diet; cut lisinopril from 10mg  to 5mg  qd 11/2014.  Marland Kitchen Hyperlipidemia    med x 1 trial=adverse side effects so he stopped it.  LDL 130s 08/2014, then dx'd with DM 2 shortly after, pt refused another trial of statin 07/2015.  Marland Kitchen Hypertension   . Sleep apnea    has cpap but dosnt use much   . Umbilical hernia 3536    Past Surgical History:  Procedure Laterality Date  . ANTERIOR CERVICAL DECOMP/DISCECTOMY FUSION N/A 03/07/2017   Procedure: Cervical four-five, Cervical five-six, Cerivcal six- seven Anterior  cervical decompression/discectomy/fusion WITH CERVICAL FIVE COREPECTOMY;  Surgeon: Erline Levine, MD;  Location: [facility];  Service: Neurosurgery;  Laterality: N/A;  C4-5 C5-6 C6-7 Anterior cervical decompression/discectomy/fusion  . BACK SURGERY  12/05/2016   Lumbar, Dr. Vertell Limber at [facility]  . COLONOSCOPY W/ POLYPECTOMY  10/13/14   Hyperplastic; recall 10 yrs  . VASECTOMY    . WISDOM TOOTH EXTRACTION      Outpatient Medications Prior to Visit  Medication Sig Dispense Refill  . allopurinol (ZYLOPRIM) 300 MG tablet TAKE ONE TABLET BY MOUTH ONCE DAILY (Patient taking differently: TAKE ONE-HALF TABLET (150 MG) BY MOUTH ONCE DAILY) 30 tablet 12  . aspirin EC 81 MG tablet Take 81 mg by mouth daily.    . hydrochlorothiazide (HYDRODIURIL) 25 MG tablet TAKE ONE TABLET BY MOUTH ONCE DAILY 30 tablet 3  . ibuprofen (ADVIL,MOTRIN) 200 MG tablet Take 400 mg by mouth every 6 (six) hours as needed (for pain relief (typically with methocarbamol)).    Marland Kitchen lisinopril (PRINIVIL,ZESTRIL) 5 MG tablet TAKE ONE TABLET BY MOUTH ONCE DAILY 90 tablet 1  . lisinopril (PRINIVIL,ZESTRIL) 5 MG tablet TAKE ONE TABLET BY MOUTH ONCE DAILY 90 tablet 1  . metFORMIN (GLUCOPHAGE) 500 MG tablet TAKE ONE TABLET BY MOUTH TWICE DAILY WITH  MEALS 60 tablet 6  . methocarbamol (ROBAXIN) 500 MG tablet Take 1 tablet (500 mg total) by mouth 4 (four) times daily as needed. For muscle spasms. 60 tablet 1  . Misc Natural Products (URINOZINC PLUS PO) Take 1 tablet by mouth daily.    . Multiple Vitamin (MULTIVITAMIN WITH MINERALS) TABS tablet Take 1 tablet by mouth daily. Men's 50+    . oxyCODONE (OXY IR/ROXICODONE) 5 MG immediate release tablet Take 1 tablet (5 mg total) by mouth every 4 (four) hours as needed for moderate pain or breakthrough pain. 40 tablet 0  . Plant Sterols and Stanols (CHOLESTOFF PLUS PO) Take 1 tablet by mouth daily.    Vladimir Faster Glycol-Propyl Glycol (SYSTANE) 0.4-0.3 % SOLN Place 1-2 drops into both eyes 4  (four) times daily as needed (for dry eyes.).     No facility-administered medications prior to visit.     Allergies  Allergen Reactions  . Prednisone Other (See Comments)    Headaches, muscle aches, fatigue    ROS As per HPI  PE: Blood pressure (!) 143/79, pulse 75, temperature 98.3 F (36.8 C), temperature source Oral, resp. rate 16, height 5' 4.5" (1.638 m), weight 184 lb (83.5 kg), SpO2 96 %. Gen: Alert, well appearing.  Patient is oriented to person, place, time, and situation. AFFECT: pleasant, lucid thought and speech. CV: RRR, no m/r/g.   LUNGS: CTA bilat, nonlabored resps, good aeration in all lung fields. EXT: no clubbing, cyanosis, or edema.    LABS:  Lab Results  Component Value Date   TSH 1.49 09/26/2016   Lab Results  Component Value Date   WBC 9.5 03/05/2017   HGB 17.0 03/05/2017   HCT 52.0 03/05/2017   MCV 91.5 03/05/2017   PLT 197 03/05/2017   Lab Results  Component Value Date   CREATININE 1.11 03/05/2017   BUN 21 (H) 03/05/2017   NA 138 03/05/2017   K 4.1 03/05/2017   CL 103 03/05/2017   CO2 27 03/05/2017   Lab Results  Component Value Date   ALT 43 09/26/2016   AST 20 09/26/2016   ALKPHOS 73 09/26/2016   BILITOT 0.5 09/26/2016   Lab Results  Component Value Date   CHOL 207 (H) 09/26/2016   Lab Results  Component Value Date   HDL 53.50 09/26/2016   Lab Results  Component Value Date   LDLCALC 119 (H) 09/26/2016   Lab Results  Component Value Date   TRIG 172.0 (H) 09/26/2016   Lab Results  Component Value Date   CHOLHDL 4 09/26/2016   Lab Results  Component Value Date   PSA 1.04 09/26/2016   PSA 1.08 08/08/2015   PSA 1.56 06/01/2014   Lab Results  Component Value Date   HGBA1C 6.7 (H) 01/28/2017    IMPRESSION AND PLAN:  1) DM 2; HbA1c today. Compliant with meds, TLC getting better.  2) HTN: The current medical regimen is effective;  continue present plan and medications. Lytes/cr today.  3) HLD: pt has  insisted on TLC "until I absolutely have to be on meds".  TLC has improved. REcheck FLP today.  4) Hx of hyperkalemia: lytes/cr today.  An After Visit Summary was printed and given to the patient.   FOLLOW UP: Return in about 3 months (around 08/30/2017) for routine chronic illness f/u.  Signed:  Crissie Sickles, MD           05/30/2017

## 2017-06-11 ENCOUNTER — Other Ambulatory Visit: Payer: Self-pay | Admitting: Family Medicine

## 2017-06-16 DIAGNOSIS — M4802 Spinal stenosis, cervical region: Secondary | ICD-10-CM | POA: Diagnosis not present

## 2017-06-16 DIAGNOSIS — M542 Cervicalgia: Secondary | ICD-10-CM | POA: Diagnosis not present

## 2017-06-16 DIAGNOSIS — I10 Essential (primary) hypertension: Secondary | ICD-10-CM | POA: Diagnosis not present

## 2017-06-16 DIAGNOSIS — M545 Low back pain: Secondary | ICD-10-CM | POA: Diagnosis not present

## 2017-06-22 ENCOUNTER — Encounter: Payer: Self-pay | Admitting: Family Medicine

## 2017-06-26 ENCOUNTER — Other Ambulatory Visit: Payer: Self-pay | Admitting: Family Medicine

## 2017-06-30 ENCOUNTER — Ambulatory Visit: Payer: BLUE CROSS/BLUE SHIELD | Admitting: Physical Therapy

## 2017-07-03 ENCOUNTER — Ambulatory Visit: Payer: BLUE CROSS/BLUE SHIELD | Attending: Neurosurgery | Admitting: Physical Therapy

## 2017-07-03 ENCOUNTER — Encounter: Payer: Self-pay | Admitting: Physical Therapy

## 2017-07-03 DIAGNOSIS — G8929 Other chronic pain: Secondary | ICD-10-CM

## 2017-07-03 DIAGNOSIS — R293 Abnormal posture: Secondary | ICD-10-CM | POA: Diagnosis not present

## 2017-07-03 DIAGNOSIS — M542 Cervicalgia: Secondary | ICD-10-CM | POA: Insufficient documentation

## 2017-07-03 DIAGNOSIS — M545 Low back pain: Secondary | ICD-10-CM | POA: Diagnosis not present

## 2017-07-03 DIAGNOSIS — M6281 Muscle weakness (generalized): Secondary | ICD-10-CM

## 2017-07-03 NOTE — Therapy (Signed)
[facility] [place], [place], [address] Phone: [phone]   Fax:  [phone]  Physical Therapy Evaluation  Patient Details  Name: Derrick Willis MRN: [id number] Date of Birth: 1961-08-03 Referring Provider: Erline Levine MD.   Encounter Date: 07/03/2017  PT End of Session - 07/03/17 1601    Visit Number  1    Number of Visits  16    Date for PT Re-Evaluation  08/28/17    PT Start Time  0145    PT Stop Time  0243    PT Time Calculation (min)  58 min    Activity Tolerance  Patient tolerated treatment well    Behavior During Therapy  [facility] for tasks assessed/performed       Past Medical History:  Diagnosis Date  . Asthma    "grew out of it", esp after quitting smoking arond age 49 yrs old  . Atypical chest pain 06/12/2014  . Blood in stool   . Carpal tunnel syndrome    bilat, L>>R (Dr. Vertell Limber).  Hand symptoms prompted referral to rheum--lab w/u NEG.  Wrist splinting x 6 wks advised, not much help so low dose prednisone rx'd, then pt had to get steroid injection into L carpal tunnel 12/2016.  Marland Kitchen Chronic low back pain   . Chronic neck pain   . Diabetes mellitus without complication ([place])    No diab retinop as of 12/2014  . Gout    Knee and toe--allopurinol since approx 2012 and has had no flares since being on this med (1/2 of 300 mg tab qd)  . Hyperkalemia 2015/2016   low K diet; cut lisinopril from 10mg  to 5mg  qd 11/2014.  Marland Kitchen Hyperlipidemia    med x 1 trial=adverse side effects so he stopped it.  LDL 130s 08/2014, then dx'd with DM 2 shortly after, pt refused another trial of statin 05/2017.  Marland Kitchen Hypertension   . Sleep apnea    has cpap but dosnt use much   . Umbilical hernia 4742    Past Surgical History:  Procedure Laterality Date  . BACK SURGERY  12/05/2016   Lumbar, Dr. Vertell Limber at [facility] center  . COLONOSCOPY W/ POLYPECTOMY  10/13/14   Hyperplastic; recall 10 yrs  . VASECTOMY    . WISDOM TOOTH  EXTRACTION      There were no vitals filed for this visit.   Subjective Assessment - 07/03/17 1500    Subjective  The patient c/o soreness across his lower back and pain on the righ side of his neck radiating into his right shouler region. His pain is mild today in both region.  Working increases his pain and rest decreases it.    Pertinent History  Cervical fusion (03/07/17); 12/05/16 (lumbar surgery).    Patient Stated Goals  Get out of pain.    Currently in Pain?  Yes    Pain Score  2     Pain Location  Neck    Pain Orientation  Right    Pain Descriptors / Indicators  Sharp    Pain Onset  More than a month ago    Pain Frequency  Constant    Aggravating Factors   See above.    Pain Relieving Factors  See above.    Multiple Pain Sites  Yes    Pain Score  2    Pain Location  Back    Pain Orientation  Right;Left    Pain Descriptors / Indicators  Sore  Pain Type  Chronic pain    Pain Onset  More than a month ago    Pain Frequency  Constant    Aggravating Factors   See above.    Pain Relieving Factors  See above.         [facility] PT Assessment - 07/03/17 0001      Assessment   Medical Diagnosis  Neck and low back     Referring Provider  Erline Levine MD.    Onset Date/Surgical Date  -- Ongoing.      Precautions   Precautions  None      Balance Screen   Has the patient fallen in the past 6 months  Yes    Has the patient had a decrease in activity level because of a fear of falling?   Yes    Is the patient reluctant to leave their home because of a fear of falling?   No      Home Environment   Living Environment  Private residence      Prior Function   Level of Independence  Independent      Posture/Postural Control   Posture/Postural Control  Postural limitations    Postural Limitations  Rounded Shoulders;Forward head;Decreased lumbar lordosis      ROM / Strength   AROM / PROM / Strength  AROM;Strength      AROM   Overall AROM Comments  Right shoulder ER= 69  degrees; trunk flexion limited by 30% and extension= 20 degrees.  Bilateral cervical rotation= 55 degrees.      Strength   Overall Strength Comments  Right shoulder ER= 4-/5 and IR= 4/5.  LE strength is normal.      Palpation   Palpation comment  Tender across lower lumbar region and bilateral SIJ's.  Tender and taut over right UT and referred pain into right mieddle deltoid region.        Special Tests    Special Tests  Rotator Cuff Impingement Normal LE DTR's.    Rotator Cuff Impingment tests  -- (+)RT shoulder Impingement test.      Ambulation/Gait   Gait Comments  The patient walks in some spinal flexion.             Objective measurements completed on examination: See above findings.      [facility] Adult PT Treatment/Exercise - 07/03/17 0001      Modalities   Modalities  Electrical Stimulation;Moist Heat      Moist Heat Therapy   Number Minutes Moist Heat  20 Minutes    Moist Heat Location  Lumbar Spine      Electrical Stimulation   Electrical Stimulation Location  Low back.    Electrical Stimulation Action  IFC    Electrical Stimulation Parameters  80-150 Hz x 20 minutes.    Electrical Stimulation Goals  Pain                  PT Long Term Goals - 07/03/17 1602      PT LONG TERM GOAL #1   Title  Independent with a HEP.    Time  8    Period  Weeks    Status  New      PT LONG TERM GOAL #2   Title  Perform ADL's with pain not > 3/10.    Time  8    Period  Weeks    Status  New      PT LONG TERM GOAL #3   Title  Negative right shoulder Impingement test.    Time  8    Period  Weeks    Status  New      PT LONG TERM GOAL #4   Title  Eliminate right UE symptoms.    Time  8    Period  Weeks    Status  New             Plan - 07/03/17 1553    Clinical Impression Statement  The patient presents to OPPT with c/o right sided neck pain radiating to right shoulder and pain across his low back.  He has limited cervical and lumbar range of  motion, weakness in his right shoulder and a positive Impingement test of his right shoulder.  Pain and deficits have impaired his functional mobiliyt.  Patient will benefit from skilled physical therapy intervention.    History and Personal Factors relevant to plan of care:  Cervical and lumbar surgeries.    Clinical Presentation  Stable    Clinical Decision Making  Moderate    Rehab Potential  Good    PT Frequency  2x / week    PT Duration  8 weeks    PT Treatment/Interventions  ADLs/Self Care Home Management;Cryotherapy;Electrical Stimulation;Ultrasound;Moist Heat;Therapeutic activities;Therapeutic exercise;Patient/family education;Manual techniques    PT Next Visit Plan  Right shoulder RW4 and SDLY ER; corner stretch; scpaular strengthening; core exercises to include mini-crunches and hip bridges.  Active cervical range of motion.  Modalities and STW/M.    Consulted and Agree with Plan of Care  Patient       Patient will benefit from skilled therapeutic intervention in order to improve the following deficits and impairments:  Decreased activity tolerance, Decreased range of motion, Postural dysfunction, Decreased strength  Visit Diagnosis: Cervicalgia - Plan: PT plan of care cert/re-cert  Abnormal posture - Plan: PT plan of care cert/re-cert  Chronic bilateral low back pain, with sciatica presence unspecified - Plan: PT plan of care cert/re-cert  Muscle weakness (generalized) - Plan: PT plan of care cert/re-cert     Problem List Patient Active Problem List   Diagnosis Date Noted  . Cervical myelopathy ([place]) 03/07/2017  . Atypical chest pain 06/12/2014  . White coat hypertension 06/12/2014    Danniela Mcbrearty, [place] MPT 07/03/2017, 5:10 PM  [facility] [address] [place], [place], [address] Phone: [phone]   Fax:  [phone]  Name: Derrick Willis MRN: [id number] Date of Birth: 03/28/1961

## 2017-07-07 ENCOUNTER — Ambulatory Visit: Payer: BLUE CROSS/BLUE SHIELD | Admitting: Physical Therapy

## 2017-07-07 DIAGNOSIS — R293 Abnormal posture: Secondary | ICD-10-CM

## 2017-07-07 DIAGNOSIS — M6281 Muscle weakness (generalized): Secondary | ICD-10-CM | POA: Diagnosis not present

## 2017-07-07 DIAGNOSIS — G8929 Other chronic pain: Secondary | ICD-10-CM

## 2017-07-07 DIAGNOSIS — M542 Cervicalgia: Secondary | ICD-10-CM

## 2017-07-07 DIAGNOSIS — M545 Low back pain: Secondary | ICD-10-CM

## 2017-07-07 NOTE — Patient Instructions (Signed)
   Derrick Willis, PT 07/07/17 9:49 AM; [facility] [place], [place], [address] Phone: [phone]   Fax:  [phone]

## 2017-07-07 NOTE — Therapy (Signed)
[facility] [place], [place], [address] Phone: [phone]   Fax:  [phone]  Physical Therapy Treatment  Patient Details  Name: Derrick Willis MRN: [id number] Date of Birth: 1961/04/09 Referring Provider: Erline Levine MD.   Encounter Date: 07/07/2017  PT End of Session - 07/07/17 0902    Visit Number  2    Number of Visits  16    Date for PT Re-Evaluation  08/28/17    PT Start Time  0900    PT Stop Time  1003    PT Time Calculation (min)  63 min    Activity Tolerance  Patient tolerated treatment well    Behavior During Therapy  [facility] for tasks assessed/performed       Past Medical History:  Diagnosis Date  . Asthma    "grew out of it", esp after quitting smoking arond age 71 yrs old  . Atypical chest pain 06/12/2014  . Blood in stool   . Carpal tunnel syndrome    bilat, L>>R (Dr. Vertell Limber).  Hand symptoms prompted referral to rheum--lab w/u NEG.  Wrist splinting x 6 wks advised, not much help so low dose prednisone rx'd, then pt had to get steroid injection into L carpal tunnel 12/2016.  Marland Kitchen Chronic low back pain   . Chronic neck pain   . Diabetes mellitus without complication ([place])    No diab retinop as of 12/2014  . Gout    Knee and toe--allopurinol since approx 2012 and has had no flares since being on this med (1/2 of 300 mg tab qd)  . Hyperkalemia 2015/2016   low K diet; cut lisinopril from '10mg'$  to '5mg'$  qd 11/2014.  Marland Kitchen Hyperlipidemia    med x 1 trial=adverse side effects so he stopped it.  LDL 130s 08/2014, then dx'd with DM 2 shortly after, pt refused another trial of statin 05/2017.  Marland Kitchen Hypertension   . Sleep apnea    has cpap but dosnt use much   . Umbilical hernia 7902    Past Surgical History:  Procedure Laterality Date  . BACK SURGERY  12/05/2016   Lumbar, Dr. Vertell Limber at [facility] surgical center  . COLONOSCOPY W/ POLYPECTOMY  10/13/14   Hyperplastic; recall 10 yrs  . VASECTOMY    . WISDOM TOOTH  EXTRACTION      There were no vitals filed for this visit.  Subjective Assessment - 07/07/17 0904    Subjective  The patient c/o soreness across his lower back and pain on the righ side of his neck radiating into his right shouler region. 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    Pertinent History  Cervical fusion (03/07/17); 12/05/16 (lumbar surgery).    Patient Stated Goals  Get out of pain.    Currently in Pain?  Yes    Pain Score  4     Pain Location  Shoulder    Pain Orientation  Right    Pain Descriptors / Indicators  Sharp    Pain Onset  More than a month ago    Pain Frequency  Constant    Aggravating Factors   work    Pain Relieving Factors  rest    Multiple Pain Sites  Yes    Pain Score  2    Pain Location  Back    Pain Orientation  Right;Left    Pain Descriptors / Indicators  Sore    Pain Type  Chronic pain    Pain  Onset  More than a month ago    Pain Frequency  Constant    Aggravating Factors   work    Pain Relieving Factors  rest                      [facility] Adult PT Treatment/Exercise - 07/07/17 0001      Self-Care   Self-Care  Other Self-Care Comments    Other Self-Care Comments   MFR with tennis ball to pecs and scapular muscles      Exercises   Exercises  Shoulder      Shoulder Exercises: Standing   Protraction  Strengthening;Right;20 reps;Theraband    Theraband Level (Shoulder Protraction)  Level 1 (Yellow)    Protraction Limitations  cues to avoid trunk rotation    External Rotation  Strengthening;Right;20 reps;Theraband    Theraband Level (Shoulder External Rotation)  Level 1 (Yellow)    Internal Rotation  Strengthening;Right;10 reps;Theraband;Limitations    Theraband Level (Shoulder Internal Rotation)  Level 1 (Yellow)    Internal Rotation Limitations  Pain    Flexion  Strengthening;Right;20 reps;Theraband    Theraband Level (Shoulder Flexion)  Level 1 (Yellow)    Extension  Strengthening;Right;20 reps;Theraband    Theraband  Level (Shoulder Extension)  Level 1 (Yellow)    Row  Strengthening;Both;20 reps;Theraband pink xts    Row Limitations  pain worsened      Shoulder Exercises: Pulleys   Flexion  Other (comment)    Flexion Limitations  6 min with resisted return from flexion      Shoulder Exercises: Stretch   Corner Stretch  1 rep;60 seconds    Corner Stretch Limitations  in doorway; fingers begin to tingle    Other Shoulder Stretches  hooklying pec stretch with LTR x [address] ea [address]    Other Shoulder Stretches  sleeper stretch for IR      Modalities   Modalities  Electrical Stimulation      Moist Heat Therapy   Number Minutes Moist Heat  15 Minutes    Moist Heat Location  Shoulder;Lumbar Spine      Electrical Stimulation   Electrical Stimulation Location  premod to R shoulder and low back    Electrical Stimulation Action  premod    Electrical Stimulation Parameters  80-150 Hz x 15 min    Electrical Stimulation Goals  Pain             PT Education - 07/07/17 1603    Education provided  Yes    Education Details  HEP and self care    Person(s) Educated  Patient    Methods  Explanation;Demonstration;Tactile cues;Verbal cues;Handout    Comprehension  Verbalized understanding;Returned demonstration          PT Long Term Goals - 07/03/17 1602      PT LONG TERM GOAL #1   Title  Independent with a HEP.    Time  8    Period  Weeks    Status  New      PT LONG TERM GOAL #2   Title  Perform ADL's with pain not > 3/10.    Time  8    Period  Weeks    Status  New      PT LONG TERM GOAL #3   Title  Negative right shoulder Impingement test.    Time  8    Period  Weeks    Status  New      PT LONG TERM GOAL #  4   Title  Eliminate right UE symptoms.    Time  8    Period  Weeks    Status  New            Plan - 07/07/17 1605    Clinical Impression Statement  Patient did very well with PT today. Marked tightness and tenderness of R pecs, subscapularis. No goals met as only second  visit.    PT Frequency  2x / week    PT Duration  8 weeks    PT Treatment/Interventions  ADLs/Self Care Home Management;Cryotherapy;Electrical Stimulation;Ultrasound;Moist Heat;Therapeutic activities;Therapeutic exercise;Patient/family education;Manual techniques    PT Next Visit Plan  STW to R subscapularis and pectorals; Pain free Right shoulder RW4 and SDLY ER; corner stretch; scpaular strengthening; core exercises to include mini-crunches and hip bridges.  Active cervical range of motion.  Modalities and STW/M.    PT Home Exercise Plan  doorway stretch, Pec stretch hooklying with LTR, sleeper stretch, self mfr    Consulted and Agree with Plan of Care  Patient       Patient will benefit from skilled therapeutic intervention in order to improve the following deficits and impairments:  Decreased activity tolerance, Decreased range of motion, Postural dysfunction, Decreased strength  Visit Diagnosis: Cervicalgia  Abnormal posture  Chronic bilateral low back pain, with sciatica presence unspecified  Muscle weakness (generalized)     Problem List Patient Active Problem List   Diagnosis Date Noted  . Cervical myelopathy ([place]) 03/07/2017  . Atypical chest pain 06/12/2014  . White coat hypertension 06/12/2014    Madelyn Flavors PT 07/07/2017, 4:09 PM  [facility] [address] [place], [place], [address] Phone: [phone]   Fax:  [phone]  Name: Derrick Willis MRN: [id number] Date of Birth: [birth year]

## 2017-07-09 ENCOUNTER — Encounter: Payer: Self-pay | Admitting: Physical Therapy

## 2017-07-09 ENCOUNTER — Ambulatory Visit: Payer: BLUE CROSS/BLUE SHIELD | Admitting: Physical Therapy

## 2017-07-09 DIAGNOSIS — M542 Cervicalgia: Secondary | ICD-10-CM | POA: Diagnosis not present

## 2017-07-09 DIAGNOSIS — M6281 Muscle weakness (generalized): Secondary | ICD-10-CM

## 2017-07-09 DIAGNOSIS — R293 Abnormal posture: Secondary | ICD-10-CM | POA: Diagnosis not present

## 2017-07-09 DIAGNOSIS — M545 Low back pain: Secondary | ICD-10-CM | POA: Diagnosis not present

## 2017-07-09 DIAGNOSIS — G8929 Other chronic pain: Secondary | ICD-10-CM

## 2017-07-09 NOTE — Therapy (Addendum)
[facility] [place], [place], [address] Phone: [phone]   Fax:  [phone]  Physical Therapy Treatment  Patient Details  Name: Derrick Willis MRN: [id number] Date of Birth: 04/01/1961 Referring Provider: Erline Levine MD.   Encounter Date: 07/09/2017  PT End of Session - 07/09/17 0736    Visit Number  3    Number of Visits  16    Date for PT Re-Evaluation  08/28/17    PT Start Time  0734    PT Stop Time  0821    PT Time Calculation (min)  47 min    Activity Tolerance  Patient tolerated treatment well    Behavior During Therapy  [facility] for tasks assessed/performed       Past Medical History:  Diagnosis Date  . Asthma    "grew out of it", esp after quitting smoking arond age 1 yrs old  . Atypical chest pain 06/12/2014  . Blood in stool   . Carpal tunnel syndrome    bilat, L>>R (Dr. Vertell Limber).  Hand symptoms prompted referral to rheum--lab w/u NEG.  Wrist splinting x 6 wks advised, not much help so low dose prednisone rx'd, then pt had to get steroid injection into L carpal tunnel 12/2016.  Marland Kitchen Chronic low back pain   . Chronic neck pain   . Diabetes mellitus without complication ([place])    No diab retinop as of 12/2014  . Gout    Knee and toe--allopurinol since approx 2012 and has had no flares since being on this med (1/2 of 300 mg tab qd)  . Hyperkalemia 2015/2016   low K diet; cut lisinopril from 56m to 520mqd 11/2014.  . Marland Kitchenyperlipidemia    med x 1 trial=adverse side effects so he stopped it.  LDL 130s 08/2014, then dx'd with DM 2 shortly after, pt refused another trial of statin 05/2017.  . Marland Kitchenypertension   . Sleep apnea    has cpap but dosnt use much   . Umbilical hernia [id number]  Past Surgical History:  Procedure Laterality Date  . BACK SURGERY  12/05/2016   [facility]  . COLONOSCOPY W/ POLYPECTOMY  10/13/14   Hyperplastic; recall 10 yrs  . VASECTOMY    . WISDOM TOOTH  EXTRACTION      There were no vitals filed for this visit.  Subjective Assessment - 07/09/17 0735    Subjective  Reports that he has some soreness and discomfort especially in R shoulder.    Pertinent History  Cervical fusion (03/07/17); 12/05/16 (lumbar surgery).    Patient Stated Goals  Get out of pain.    Currently in Pain?  Yes    Pain Score  3     Pain Location  Shoulder    Pain Orientation  Right    Pain Descriptors / Indicators  Sore;Discomfort    Pain Onset  More than a month ago         [facility]      Assessment   Medical Diagnosis  Neck and low back     Next MD Visit  08/2017      Precautions   Precautions  None                  [facility] Adult PT Treatment/Exercise - 07/09/17 0001      Exercises   Exercises  Shoulder;Lumbar      Shoulder Exercises: Standing  Protraction  Strengthening;Right;20 reps;Theraband    Theraband Level (Shoulder Protraction)  Level 1 (Yellow)    External Rotation  Strengthening;Right;20 reps;Theraband    Theraband Level (Shoulder External Rotation)  Level 1 (Yellow)    Internal Rotation  Strengthening;Right;10 reps;Theraband;Limitations    Theraband Level (Shoulder Internal Rotation)  Level 1 (Yellow)    Internal Rotation Limitations  Pain    Flexion  Strengthening;Right;20 reps;Theraband    Theraband Level (Shoulder Flexion)  Level 1 (Yellow)    Extension  Strengthening;Right;20 reps;Theraband    Theraband Level (Shoulder Extension)  Level 1 (Yellow)    Other Standing Exercises  Snow angels x20 reps      Shoulder Exercises: Pulleys   Flexion  Other (comment) x5 min      Shoulder Exercises: ROM/Strengthening   UBE (Upper Arm Bike)  120 RPM x6 min (forwards)    Wall Pushups  20 reps      Modalities   Modalities  Electrical Stimulation;Moist Heat      Moist Heat Therapy   Number Minutes Moist Heat  15 Minutes    Moist Heat Location  Shoulder;Lumbar Spine      Electrical Stimulation    Electrical Stimulation Location  R UT, B low back    Electrical Stimulation Action  Pre-Mod    Electrical Stimulation Parameters  80-150 hz x15 min    Electrical Stimulation Goals  Pain                  PT Long Term Goals - 07/09/17 0827      PT LONG TERM GOAL #1   Title  Independent with a HEP.    Time  8    Period  Weeks    Status  Achieved      PT LONG TERM GOAL #2   Title  Perform ADL's with pain not > 3/10.    Time  8    Period  Weeks    Status  On-going      PT LONG TERM GOAL #3   Title  Negative right shoulder Impingement test.    Time  8    Period  Weeks    Status  On-going      PT LONG TERM GOAL #4   Title  Eliminate right UE symptoms.    Time  8    Period  Weeks    Status  On-going            Plan - 07/09/17 4599    Clinical Impression Statement  Patient tolerated today's treatment well as he arrived with only complaints of minimal soreness. Patient continues to have discomfort with R shoulder IR and displayed difficulty with snow angels at wall for posture. Soreness reported by patient with bridges but otherwise good technique. Normal modalities response noted following removal of the modalities.    Rehab Potential  Good    PT Frequency  2x / week    PT Duration  8 weeks    PT Treatment/Interventions  ADLs/Self Care Home Management;Cryotherapy;Electrical Stimulation;Ultrasound;Moist Heat;Therapeutic activities;Therapeutic exercise;Patient/family education;Manual techniques    PT Next Visit Plan  STW to R subscapularis and pectorals; Pain free Right shoulder RW4 and SDLY ER; corner stretch; scpaular strengthening; core exercises to include mini-crunches and hip bridges.  Active cervical range of motion.  Modalities and STW/M.    PT Home Exercise Plan  doorway stretch, Pec stretch hooklying with LTR, sleeper stretch, self mfr    Consulted and Agree with Plan of Care  Patient  Patient will benefit from skilled therapeutic intervention in  order to improve the following deficits and impairments:  Decreased activity tolerance, Decreased range of motion, Postural dysfunction, Decreased strength  Visit Diagnosis: Cervicalgia  Abnormal posture  Chronic bilateral low back pain, with sciatica presence unspecified  Muscle weakness (generalized)     Problem List Patient Active Problem List   Diagnosis Date Noted  . Cervical myelopathy ([place]) 03/07/2017  . Atypical chest pain 06/12/2014  . White coat hypertension 06/12/2014    Standley Brooking, PTA 07/09/2017, 8:28 AM  [facility] [address] [place], [place], [address] Phone: [phone]   Fax:  [phone]  Name: Derrick Willis MRN: [id number] Date of Birth: [birth year]  PHYSICAL THERAPY DISCHARGE SUMMARY  Visits from Start of Care: 3.  Current functional level related to goals / functional outcomes: See above.   Remaining deficits: See below.   Education / Equipment: HEP. Plan: Patient agrees to discharge.  Patient goals were not met. Patient is being discharged due to not returning since the last visit.  ?????         Mali Applegate MPT

## 2017-07-15 ENCOUNTER — Encounter: Payer: BLUE CROSS/BLUE SHIELD | Admitting: *Deleted

## 2017-07-21 ENCOUNTER — Other Ambulatory Visit: Payer: Self-pay

## 2017-07-21 ENCOUNTER — Ambulatory Visit (INDEPENDENT_AMBULATORY_CARE_PROVIDER_SITE_OTHER): Payer: BLUE CROSS/BLUE SHIELD | Admitting: Family Medicine

## 2017-07-21 ENCOUNTER — Encounter: Payer: Self-pay | Admitting: Family Medicine

## 2017-07-21 VITALS — BP 127/75 | HR 64 | Temp 98.1°F | Resp 16 | Ht 64.5 in | Wt 185.8 lb

## 2017-07-21 DIAGNOSIS — K529 Noninfective gastroenteritis and colitis, unspecified: Secondary | ICD-10-CM

## 2017-07-21 DIAGNOSIS — R1031 Right lower quadrant pain: Secondary | ICD-10-CM | POA: Diagnosis not present

## 2017-07-21 LAB — CBC WITH DIFFERENTIAL/PLATELET
BASOS ABS: 105 {cells}/uL (ref 0–200)
Basophils Relative: 0.9 %
EOS ABS: 187 {cells}/uL (ref 15–500)
EOS PCT: 1.6 %
HEMATOCRIT: 45.9 % (ref 38.5–50.0)
HEMOGLOBIN: 16.1 g/dL (ref 13.2–17.1)
LYMPHS ABS: 2761 {cells}/uL (ref 850–3900)
MCH: 29.9 pg (ref 27.0–33.0)
MCHC: 35.1 g/dL (ref 32.0–36.0)
MCV: 85.3 fL (ref 80.0–100.0)
MPV: 10 fL (ref 7.5–12.5)
Monocytes Relative: 5.7 %
NEUTROS ABS: 7979 {cells}/uL — AB (ref 1500–7800)
NEUTROS PCT: 68.2 %
Platelets: 225 10*3/uL (ref 140–400)
RBC: 5.38 10*6/uL (ref 4.20–5.80)
RDW: 13 % (ref 11.0–15.0)
Total Lymphocyte: 23.6 %
WBC: 11.7 10*3/uL — ABNORMAL HIGH (ref 3.8–10.8)
WBCMIX: 667 {cells}/uL (ref 200–950)

## 2017-07-21 LAB — COMPREHENSIVE METABOLIC PANEL
AG Ratio: 1.8 (calc) (ref 1.0–2.5)
ALT: 24 U/L (ref 9–46)
AST: 17 U/L (ref 10–35)
Albumin: 4.3 g/dL (ref 3.6–5.1)
Alkaline phosphatase (APISO): 72 U/L (ref 40–115)
BUN: 18 mg/dL (ref 7–25)
CHLORIDE: 101 mmol/L (ref 98–110)
CO2: 34 mmol/L — ABNORMAL HIGH (ref 20–32)
CREATININE: 0.95 mg/dL (ref 0.70–1.33)
Calcium: 9.8 mg/dL (ref 8.6–10.3)
GLOBULIN: 2.4 g/dL (ref 1.9–3.7)
GLUCOSE: 97 mg/dL (ref 65–99)
Potassium: 4.7 mmol/L (ref 3.5–5.3)
Sodium: 140 mmol/L (ref 135–146)
Total Bilirubin: 0.6 mg/dL (ref 0.2–1.2)
Total Protein: 6.7 g/dL (ref 6.1–8.1)

## 2017-07-21 MED ORDER — PROMETHAZINE HCL 12.5 MG PO TABS: ORAL_TABLET | ORAL | 0 refills | Status: DC

## 2017-07-21 NOTE — Progress Notes (Signed)
OFFICE VISIT  07/21/2017   CC:  Chief Complaint  Patient presents with  . Abdominal Pain    right side   HPI:    Patient is a 56 y.o. Caucasian male who presents for abdominal complaint. He has no hx of abdominal surgery. Colonoscopy 10/13/14 w/hyperplastic polyp--repeat 10 yrs.  Onset of abd pain yesterday evening.   Onset of nausea and diarrhea 3-4 d/a.  No vomiting.  No fevers.  Tolerating fluids but not drinking too much.  Coca cola settled his stomach. BM's watery: has one when he tries to eat anything---about 3 BMs per day.  Had some BRBPR c/w his past hemorrhoidal bleeding.  His RLQ pain does not radiate.  It started in RLQ and has stayed there.  Pain is constant, intensity increases when he pushes on it. No known sick contacts with similar sx's.  Has never had this kind of abd pain before. REcalls no trauma or abd strain.  ROS: no HA, no neck stiffness, no URI or cough, no ST, no back pain, no rash, no LE swelling.  No CP, no SOB.   Past Medical History:  Diagnosis Date  . Asthma    "grew out of it", esp after quitting smoking arond age 11 yrs old  . Atypical chest pain 06/12/2014  . Blood in stool   . Carpal tunnel syndrome    bilat, L>>R (Dr. Vertell Limber).  Hand symptoms prompted referral to rheum--lab w/u NEG.  Wrist splinting x 6 wks advised, not much help so low dose prednisone rx'd, then pt had to get steroid injection into L carpal tunnel 12/2016.  Marland Kitchen Chronic low back pain   . Chronic neck pain   . Diabetes mellitus without complication ([place])    No diab retinop as of 12/2014  . Gout    Knee and toe--allopurinol since approx 2012 and has had no flares since being on this med (1/2 of 300 mg tab qd)  . Hyperkalemia 2015/2016   low K diet; cut lisinopril from 10mg  to 5mg  qd 11/2014.  Marland Kitchen Hyperlipidemia    med x 1 trial=adverse side effects so he stopped it.  LDL 130s 08/2014, then dx'd with DM 2 shortly after, pt refused another trial of statin 05/2017.  Marland Kitchen Hypertension   .  Sleep apnea    has cpap but dosnt use much   . Umbilical hernia 7829    Past Surgical History:  Procedure Laterality Date  . ANTERIOR CERVICAL DECOMP/DISCECTOMY FUSION N/A 03/07/2017   Procedure: Cervical four-five, Cervical five-six, Cerivcal six- seven Anterior cervical decompression/discectomy/fusion WITH CERVICAL FIVE COREPECTOMY;  Surgeon: Erline Levine, MD;  Location: [facility];  Service: Neurosurgery;  Laterality: N/A;  C4-5 C5-6 C6-7 Anterior cervical decompression/discectomy/fusion  . BACK SURGERY  12/05/2016   Lumbar, Dr. Vertell Limber at [facility] surgical center  . COLONOSCOPY W/ POLYPECTOMY  10/13/14   Hyperplastic; recall 10 yrs  . VASECTOMY    . WISDOM TOOTH EXTRACTION      Outpatient Medications Prior to Visit  Medication Sig Dispense Refill  . allopurinol (ZYLOPRIM) 300 MG tablet TAKE ONE TABLET BY MOUTH ONCE DAILY (Patient taking differently: TAKE ONE-HALF TABLET (150 MG) BY MOUTH ONCE DAILY) 30 tablet 12  . aspirin EC 81 MG tablet Take 81 mg by mouth daily.    Marland Kitchen gabapentin (NEURONTIN) 300 MG capsule Take 1 capsule by mouth 3 (three) times daily.  0  . hydrochlorothiazide (HYDRODIURIL) 25 MG tablet TAKE 1 TABLET BY MOUTH ONCE DAILY 30 tablet 3  . ibuprofen (  ADVIL,MOTRIN) 200 MG tablet Take 400 mg by mouth every 6 (six) hours as needed (for pain relief (typically with methocarbamol)).    Marland Kitchen lisinopril (PRINIVIL,ZESTRIL) 5 MG tablet TAKE ONE TABLET BY MOUTH ONCE DAILY 90 tablet 1  . metFORMIN (GLUCOPHAGE) 500 MG tablet TAKE ONE TABLET BY MOUTH TWICE DAILY WITH MEALS 60 tablet 3  . methocarbamol (ROBAXIN) 500 MG tablet Take 1 tablet (500 mg total) by mouth 4 (four) times daily as needed. For muscle spasms. 60 tablet 1  . Misc Natural Products (URINOZINC PLUS PO) Take 1 tablet by mouth daily.    . Multiple Vitamin (MULTIVITAMIN WITH MINERALS) TABS tablet Take 1 tablet by mouth daily. Men's 50+    . Plant Sterols and Stanols (CHOLESTOFF PLUS PO) Take 1 tablet by mouth daily.    Vladimir Faster Glycol-Propyl Glycol (SYSTANE) 0.4-0.3 % SOLN Place 1-2 drops into both eyes 4 (four) times daily as needed (for dry eyes.).    Marland Kitchen oxyCODONE (OXY IR/ROXICODONE) 5 MG immediate release tablet Take 1 tablet (5 mg total) by mouth every 4 (four) hours as needed for moderate pain or breakthrough pain. (Patient not taking: Reported on 07/21/2017) 40 tablet 0  . lisinopril (PRINIVIL,ZESTRIL) 5 MG tablet TAKE ONE TABLET BY MOUTH ONCE DAILY (Patient not taking: Reported on 07/03/2017) 90 tablet 1   No facility-administered medications prior to visit.     Allergies  Allergen Reactions  . Prednisone Other (See Comments)    Headaches, muscle aches, fatigue    ROS As per HPI  PE: Blood pressure 127/75, pulse 64, temperature 98.1 F (36.7 C), temperature source Oral, resp. rate 16, height 5' 4.5" (1.638 m), weight 185 lb 12 oz (84.3 kg), SpO2 95 %. Gen: Alert, well appearing.  Patient is oriented to person, place, time, and situation. AFFECT: pleasant, lucid thought and speech. EGB:TDVV: no injection, icteris, swelling, or exudate.  EOMI, PERRLA. Mouth: lips without lesion/swelling.  Oral mucosa pink and moist. Oropharynx without erythema, exudate, or swelling.  CV: RRR, no m/r/g.   LUNGS: CTA bilat, nonlabored resps, good aeration in all lung fields. ABD: soft, nondistended.  No HSM or bruit.  No mass.  BS normal.  He has focal RLQ tenderness a bit superior to McBurney's point.  No guarding or rebound tenderness.  His abd pain LESSENS when he does an abd crunch as I palpate in RLQ. EXT: no clubbing, cyanosis, or edema.  Skin - no sores or suspicious lesions or rashes or color changes   LABS:    Chemistry      Component Value Date/Time   NA 139 05/30/2017 0813   K 4.6 05/30/2017 0813   CL 101 05/30/2017 0813   CO2 32 05/30/2017 0813   BUN 18 05/30/2017 0813   CREATININE 0.97 05/30/2017 0813      Component Value Date/Time   CALCIUM 9.8 05/30/2017 0813   ALKPHOS 73 09/26/2016 0839    AST 20 09/26/2016 0839   ALT 43 09/26/2016 0839   BILITOT 0.5 09/26/2016 0839     Lab Results  Component Value Date   WBC 9.5 03/05/2017   HGB 17.0 03/05/2017   HCT 52.0 03/05/2017   MCV 91.5 03/05/2017   PLT 197 03/05/2017   Lab Results  Component Value Date   HGBA1C 6.2 05/30/2017   IMPRESSION AND PLAN:  Acute gastroenteritis suspected, with a prominent component of RLQ abd pain. Low suspicion of acute appendicitis, although I cannot rule this dx out at this time. We discussed options  today: go to ED where he can get fast BMET and CT abd/pelvis vs "watchful waiting" and get labs done in office today, f/u for recheck in office tomorrow.  Pt elected for the watchful waiting approach.   Phenergan 12.5mg , 1-2 q6h prn.  Therapeutic expectations and side effect profile of medication discussed today.  Patient's questions answered. No antidiarrheal med recommended. For now, hold hctz, ACE-I, and metformin. Push fluids, rest.  Instructions: Focus on drinking clear fluids. If your abdominal pain worsens before your appointment with me tomorrow---go straight to the emergency dept or call 911.  FOLLOW UP: Return in about 1 day (around 07/22/2017) for f/u tomorrow (30 min) for abd pain recheck.  Signed:  Crissie Sickles, MD           07/21/2017

## 2017-07-21 NOTE — Patient Instructions (Signed)
Focus on drinking clear fluids. If your abdominal pain worsens before your appointment with me tomorrow---go straight to the emergency dept or call 911.

## 2017-07-22 ENCOUNTER — Telehealth: Payer: Self-pay

## 2017-07-22 ENCOUNTER — Encounter: Payer: Self-pay | Admitting: Family Medicine

## 2017-07-22 ENCOUNTER — Ambulatory Visit: Payer: BLUE CROSS/BLUE SHIELD | Admitting: Family Medicine

## 2017-07-22 VITALS — BP 113/72 | HR 68 | Temp 98.3°F | Resp 16 | Ht 64.5 in | Wt 186.0 lb

## 2017-07-22 DIAGNOSIS — R1031 Right lower quadrant pain: Secondary | ICD-10-CM

## 2017-07-22 DIAGNOSIS — K529 Noninfective gastroenteritis and colitis, unspecified: Secondary | ICD-10-CM

## 2017-07-22 NOTE — Telephone Encounter (Signed)
Provider aware of results and will review with pt today during his office visit.

## 2017-07-22 NOTE — Telephone Encounter (Signed)
[place] Night - Client TELEPHONE [facility] Patient Name: Derrick Willis Gender: Male DOB: September 01, 1960 Age: 56 Y 10 M 12 D Return Phone Number: Address: City/State/Zip:  Client [place] Night - Client Client Site [place] Night Physician Crissie Sickles - MD Contact Type Call Who Is Calling Lab Lab Name [facility] Lab Phone Number [phone] Lab Tech Name Harriette Ohara Reference Number [id number] D Chief Complaint Lab Result (Critical or Stat) Call Type Lab Send to RN Reason for Call Report lab results Initial Comment Caller is from [facility] and they have a stat lab to report. Additional Comment Translation No Nurse Assessment Nurse: Donovan Kail, RN, Barnetta Chapel Date/Time (Eastern Time): 07/21/2017 5:46:41 PM Is there an on-call provider listed? ---Yes Please list name of person reporting value (Lab Employee) and a contact number. ---Caller is from [facility] and they have a stat lab to report. [id number] [name] [phone] Please document the following items: Lab name Lab value (read back to lab to verify) Reference range for lab value Date and time blood was drawn Collect time of birth for bilirubin results ---CMP received 5 pm- not done. CBC is done. Stat- abnormalsabsolute neutrophil [id number] ( [phone]). Previous results: none available Please collect the patient contact information from the lab. (name, phone number and address) ---[phone]. Guidelines Guideline Title Affirmed Question Affirmed Notes Nurse Date/Time (Eastern Time) Disp. Time Eilene Ghazi Time) Disposition Final User 07/21/2017 6:00:54 PM Lab Call Donovan Kail, RN, Barnetta Chapel Reason: Stat CBC. High absolute neutrophil 07/21/2017 6:03:02 PM Attempt made - line busy Idamae Lusher 07/21/2017 7:24:56 PM Call Completed Donovan Kail, RN, Barnetta Chapel 07/21/2017 6:02:55 PM Clinical Call  Yes Donovan Kail, RN, Mamie Nick NOTE: All timestamps contained within this report are represented as [place] Standard Time. CONFIDENTIALTY NOTICE: This fax transmission is intended only for the addressee. It contains information that is legally privileged, confidential or otherwise protected from use or disclosure. If you are not the intended recipient, you are strictly prohibited from reviewing, disclosing, copying using or disseminating any of this information or taking any action in reliance on or regarding this information. If you have received this fax in error, please notify us immediately by telephone so that we can arrange for its return to [place]. Phone: [phone], Toll-Free: [phone], Fax: [phone] Page: 2 of 2 Call Id: [id number]

## 2017-07-22 NOTE — Patient Instructions (Addendum)
You may get some over the counter generic imodium and take this as instructed on the packaging. Continue to take your nausea medication IF NEEDED. Continue to concentrate on drinking lots of clear fluids + gatorade or powerade  Bland Diet A bland diet consists of foods that do not have a lot of fat or fiber. Foods without fat or fiber are easier for the body to digest. They are also less likely to irritate your mouth, throat, stomach, and other parts of your gastrointestinal tract. A bland diet is sometimes called a BRAT diet. What is my plan? Your health care provider or dietitian may recommend specific changes to your diet to prevent and treat your symptoms, such as:  Eating small meals often.  Cooking food until it is soft enough to chew easily.  Chewing your food well.  Drinking fluids slowly.  Not eating foods that are very spicy, sour, or fatty.  Not eating citrus fruits, such as oranges and grapefruit.  What do I need to know about this diet?  Eat a variety of foods from the bland diet food list.  Do not follow a bland diet longer than you have to.  Ask your health care provider whether you should take vitamins. What foods can I eat? Grains  Hot cereals, such as cream of wheat. Bread, crackers, or tortillas made from refined white flour. Rice. Vegetables Canned or cooked vegetables. Mashed or boiled potatoes. Fruits Bananas. Applesauce. Other types of cooked or canned fruit with the skin and seeds removed, such as canned peaches or pears. Meats and Other Protein Sources Scrambled eggs. Creamy peanut butter or other nut butters. Lean, well-cooked meats, such as chicken or fish. Tofu. Soups or broths. Dairy Low-fat dairy products, such as milk, cottage cheese, or yogurt. Beverages Water. Herbal tea. Apple juice. Sweets and Desserts Pudding. Custard. Fruit gelatin. Ice cream. Fats and Oils Mild salad dressings. Canola or olive oil. The items listed above may not be a  complete list of allowed foods or beverages. Contact your dietitian for more options. What foods are not recommended? Foods and ingredients that are often not recommended include:  Spicy foods, such as hot sauce or salsa.  Fried foods.  Sour foods, such as pickled or fermented foods.  Raw vegetables or fruits, especially citrus or berries.  Caffeinated drinks.  Alcohol.  Strongly flavored seasonings or condiments.  The items listed above may not be a complete list of foods and beverages that are not allowed. Contact your dietitian for more information. This information is not intended to replace advice given to you by your health care provider. Make sure you discuss any questions you have with your health care provider. Document Released: 12/04/2015 Document Revised: 01/18/2016 Document Reviewed: 08/24/2014 Elsevier Interactive Patient Education  2018 [employer].

## 2017-07-22 NOTE — Progress Notes (Signed)
OFFICE VISIT  07/22/2017   CC:  Chief Complaint  Patient presents with  . Follow-up    abdominal pain   HPI:    Patient is a 56 y.o. Caucasian male who presents for 1 day follow up of mild gastroenteritis symptoms with additional focal RLQ pain that we chose a conservative "watchful waiting approach" to. Yesterday's labs reviewed with pt today: mild neutrophilic leukocytosis, o/w normal.  He reports feeling quite a bit better.  Abd pain has gradually improved and seems more diffuse than yesterday. No fever.  Phenergan helped his nausea.  Took this last night.  Has been drinking mostly water. Ate a hamburger and this did not make anything worse except he did have a loose BM about an hour later and another later in the evening.  Drank some water and some decaf coffee this morning.  No food yet today.  Past Medical History:  Diagnosis Date  . Asthma    "grew out of it", esp after quitting smoking arond age 20 yrs old  . Atypical chest pain 06/12/2014  . Blood in stool   . Carpal tunnel syndrome    bilat, L>>R (Dr. Vertell Limber).  Hand symptoms prompted referral to rheum--lab w/u NEG.  Wrist splinting x 6 wks advised, not much help so low dose prednisone rx'd, then pt had to get steroid injection into L carpal tunnel 12/2016.  Marland Kitchen Chronic low back pain   . Chronic neck pain   . Diabetes mellitus without complication ([place])    No diab retinop as of 12/2014  . Gout    Knee and toe--allopurinol since approx 2012 and has had no flares since being on this med (1/2 of 300 mg tab qd)  . Hyperkalemia 2015/2016   low K diet; cut lisinopril from 10mg  to 5mg  qd 11/2014.  Marland Kitchen Hyperlipidemia    med x 1 trial=adverse side effects so he stopped it.  LDL 130s 08/2014, then dx'd with DM 2 shortly after, pt refused another trial of statin 05/2017.  Marland Kitchen Hypertension   . Sleep apnea    has cpap but dosnt use much   . Umbilical hernia 2703    Past Surgical History:  Procedure Laterality Date  . ANTERIOR CERVICAL  DECOMP/DISCECTOMY FUSION N/A 03/07/2017   Procedure: Cervical four-five, Cervical five-six, Cerivcal six- seven Anterior cervical decompression/discectomy/fusion WITH CERVICAL FIVE COREPECTOMY;  Surgeon: Erline Levine, MD;  Location: [facility];  Service: Neurosurgery;  Laterality: N/A;  C4-5 C5-6 C6-7 Anterior cervical decompression/discectomy/fusion  . BACK SURGERY  12/05/2016   Lumbar, Dr. Vertell Limber at [facility] surgical center  . COLONOSCOPY W/ POLYPECTOMY  10/13/14   Hyperplastic; recall 10 yrs  . VASECTOMY    . WISDOM TOOTH EXTRACTION      Outpatient Medications Prior to Visit  Medication Sig Dispense Refill  . allopurinol (ZYLOPRIM) 300 MG tablet TAKE ONE TABLET BY MOUTH ONCE DAILY (Patient taking differently: TAKE ONE-HALF TABLET (150 MG) BY MOUTH ONCE DAILY) 30 tablet 12  . aspirin EC 81 MG tablet Take 81 mg by mouth daily.    Marland Kitchen gabapentin (NEURONTIN) 300 MG capsule Take 1 capsule by mouth 3 (three) times daily.  0  . hydrochlorothiazide (HYDRODIURIL) 25 MG tablet TAKE 1 TABLET BY MOUTH ONCE DAILY 30 tablet 3  . ibuprofen (ADVIL,MOTRIN) 200 MG tablet Take 400 mg by mouth every 6 (six) hours as needed (for pain relief (typically with methocarbamol)).    Marland Kitchen lisinopril (PRINIVIL,ZESTRIL) 5 MG tablet TAKE ONE TABLET BY MOUTH ONCE DAILY 90 tablet  1  . metFORMIN (GLUCOPHAGE) 500 MG tablet TAKE ONE TABLET BY MOUTH TWICE DAILY WITH MEALS 60 tablet 3  . methocarbamol (ROBAXIN) 500 MG tablet Take 1 tablet (500 mg total) by mouth 4 (four) times daily as needed. For muscle spasms. 60 tablet 1  . Misc Natural Products (URINOZINC PLUS PO) Take 1 tablet by mouth daily.    . Multiple Vitamin (MULTIVITAMIN WITH MINERALS) TABS tablet Take 1 tablet by mouth daily. Men's 50+    . Plant Sterols and Stanols (CHOLESTOFF PLUS PO) Take 1 tablet by mouth daily.    Vladimir Faster Glycol-Propyl Glycol (SYSTANE) 0.4-0.3 % SOLN Place 1-2 drops into both eyes 4 (four) times daily as needed (for dry eyes.).    Marland Kitchen promethazine  (PHENERGAN) 12.5 MG tablet 1-2 tabs po q6h prn nausea 20 tablet 0  . oxyCODONE (OXY IR/ROXICODONE) 5 MG immediate release tablet Take 1 tablet (5 mg total) by mouth every 4 (four) hours as needed for moderate pain or breakthrough pain. (Patient not taking: Reported on 07/21/2017) 40 tablet 0   No facility-administered medications prior to visit.     Allergies  Allergen Reactions  . Prednisone Other (See Comments)    Headaches, muscle aches, fatigue    ROS As per HPI  PE: Blood pressure 113/72, pulse 68, temperature 98.3 F (36.8 C), temperature source Oral, resp. rate 16, height 5' 4.5" (1.638 m), weight 186 lb (84.4 kg), SpO2 94 %. Gen: Alert, well appearing.  Patient is oriented to person, place, time, and situation. AFFECT: pleasant, lucid thought and speech. ABD: soft, non-distended.  BS normal.  NO HSM or mass or bruit. He has mild RLQ, mild mid lower abd, and mild LLQ tenderness.  RLQ a bit more than the other areas. No guarding or rebound.  His tenderness abates when he does an abd crunch while I palpate in RLQ.  LABS:  Lab Results  Component Value Date   WBC 11.7 (H) 07/21/2017   HGB 16.1 07/21/2017   HCT 45.9 07/21/2017   MCV 85.3 07/21/2017   PLT 225 07/21/2017     Chemistry      Component Value Date/Time   NA 140 07/21/2017 1539   K 4.7 07/21/2017 1539   CL 101 07/21/2017 1539   CO2 34 (H) 07/21/2017 1539   BUN 18 07/21/2017 1539   CREATININE 0.95 07/21/2017 1539      Component Value Date/Time   CALCIUM 9.8 07/21/2017 1539   ALKPHOS 73 09/26/2016 0839   AST 17 07/21/2017 1539   ALT 24 07/21/2017 1539   BILITOT 0.6 07/21/2017 1539     Lab Results  Component Value Date   HGBA1C 6.2 05/30/2017   IMPRESSION AND PLAN:  Acute GE with RLQ pain. He is improved in last 18 hours. CBC and CMET were reassuring. Very low suspicion for appendicitis or diverticulitis. Expect continued gradual resolution of sx's. Instructions: You may get some over the counter  generic imodium and take this as instructed on the packaging. Continue to take your nausea medication IF NEEDED. Continue to concentrate on drinking lots of clear fluids + gatorade or powerade Bland diet hand reviewed/given to pt.  An After Visit Summary was printed and given to the patient.  FOLLOW UP: Return if symptoms worsen or fail to improve.  Signed:  Crissie Sickles, MD           07/22/2017

## 2017-08-29 ENCOUNTER — Encounter: Payer: Self-pay | Admitting: Family Medicine

## 2017-08-29 DIAGNOSIS — I10 Essential (primary) hypertension: Secondary | ICD-10-CM | POA: Insufficient documentation

## 2017-08-29 DIAGNOSIS — E119 Type 2 diabetes mellitus without complications: Secondary | ICD-10-CM | POA: Insufficient documentation

## 2017-09-01 ENCOUNTER — Ambulatory Visit: Payer: BLUE CROSS/BLUE SHIELD | Admitting: Family Medicine

## 2017-09-01 ENCOUNTER — Encounter: Payer: Self-pay | Admitting: Family Medicine

## 2017-09-01 VITALS — BP 108/71 | HR 62 | Temp 98.1°F | Resp 16 | Ht 64.5 in | Wt 187.0 lb

## 2017-09-01 DIAGNOSIS — E119 Type 2 diabetes mellitus without complications: Secondary | ICD-10-CM | POA: Diagnosis not present

## 2017-09-01 DIAGNOSIS — I10 Essential (primary) hypertension: Secondary | ICD-10-CM | POA: Diagnosis not present

## 2017-09-01 DIAGNOSIS — E7800 Pure hypercholesterolemia, unspecified: Secondary | ICD-10-CM | POA: Diagnosis not present

## 2017-09-01 LAB — BASIC METABOLIC PANEL
BUN: 24 mg/dL — ABNORMAL HIGH (ref 6–23)
CO2: 30 mEq/L (ref 19–32)
Calcium: 9.7 mg/dL (ref 8.4–10.5)
Chloride: 99 mEq/L (ref 96–112)
Creatinine, Ser: 0.96 mg/dL (ref 0.40–1.50)
GFR: 85.82 mL/min (ref >60.00)
Glucose, Bld: 129 mg/dL — ABNORMAL HIGH (ref 70–99)
POTASSIUM: 4.2 meq/L (ref 3.5–5.1)
SODIUM: 138 meq/L (ref 135–145)

## 2017-09-01 LAB — HEMOGLOBIN A1C: HEMOGLOBIN A1C: 6.6 % — AB (ref 4.6–6.5)

## 2017-09-01 NOTE — Progress Notes (Signed)
OFFICE VISIT  09/01/2017   CC:  Chief Complaint  Patient presents with  . Follow-up    RCI, pt is fasting.     HPI:    Patient is a 57 y.o. Caucasian male who presents for 3 mo f/u DM 2, HTN, HLD.  DM 2: no home gluc monitoring.  Just started eating healthy again, lower carbs 1 week ago. Walking regularly again, joining the [employer] to swim soon.  HTN: occ home bp check is <130/80.  Compliant with meds.  HLD: he has been intol of statin and declines any further trial of this med despite being told of the benefits of this med in diabetics.  ROS: no CP, no SOB, no dizziness, no palpitations, no melena or BRBPR.  Past Medical History:  Diagnosis Date  . Asthma    "grew out of it", esp after quitting smoking arond age 60 yrs old  . Atypical chest pain 06/12/2014  . Blood in stool   . Carpal tunnel syndrome    bilat, L>>R (Dr. Vertell Limber).  Hand symptoms prompted referral to rheum--lab w/u NEG.  Wrist splinting x 6 wks advised, not much help so low dose prednisone rx'd, then pt had to get steroid injection into L carpal tunnel 12/2016.  Marland Kitchen Chronic low back pain   . Chronic neck pain   . Diabetes mellitus without complication ([place])    No diab retinop as of 12/2014  . Gout    Knee and toe--allopurinol since approx 2012 and has had no flares since being on this med (1/2 of 300 mg tab qd)  . Hyperkalemia 2015/2016   low K diet; cut lisinopril from 10mg  to 5mg  qd 11/2014.  Marland Kitchen Hyperlipidemia    med x 1 trial=adverse side effects so he stopped it.  LDL 130s 08/2014, then dx'd with DM 2 shortly after, pt refused another trial of statin 05/2017.  Marland Kitchen Hypertension   . Sleep apnea    has cpap but dosnt use much   . Umbilical hernia [id number]    Past Surgical History:  Procedure Laterality Date  . ANTERIOR CERVICAL DECOMP/DISCECTOMY FUSION N/A 03/07/2017   Procedure: Cervical four-five, Cervical five-six, Cerivcal six- seven Anterior cervical decompression/discectomy/fusion WITH CERVICAL FIVE COREPECTOMY;   Surgeon: Erline Levine, MD;  Location: [facility];  Service: Neurosurgery;  Laterality: N/A;  C4-5 C5-6 C6-7 Anterior cervical decompression/discectomy/fusion  . BACK SURGERY  12/05/2016   Lumbar, Dr. Vertell Limber at [facility] surgical center  . COLONOSCOPY W/ POLYPECTOMY  10/13/14   Hyperplastic; recall 10 yrs  . VASECTOMY    . WISDOM TOOTH EXTRACTION      Outpatient Medications Prior to Visit  Medication Sig Dispense Refill  . allopurinol (ZYLOPRIM) 300 MG tablet TAKE ONE TABLET BY MOUTH ONCE DAILY (Patient taking differently: TAKE ONE-HALF TABLET (150 MG) BY MOUTH ONCE DAILY) 30 tablet 12  . aspirin EC 81 MG tablet Take 81 mg by mouth daily.    Marland Kitchen gabapentin (NEURONTIN) 600 MG tablet Take 1 tablet by mouth 2 (two) times daily.  0  . hydrochlorothiazide (HYDRODIURIL) 25 MG tablet TAKE 1 TABLET BY MOUTH ONCE DAILY 30 tablet 3  . ibuprofen (ADVIL,MOTRIN) 200 MG tablet Take 400 mg by mouth every 6 (six) hours as needed (for pain relief (typically with methocarbamol)).    Marland Kitchen lisinopril (PRINIVIL,ZESTRIL) 5 MG tablet TAKE ONE TABLET BY MOUTH ONCE DAILY 90 tablet 1  . metFORMIN (GLUCOPHAGE) 500 MG tablet TAKE ONE TABLET BY MOUTH TWICE DAILY WITH MEALS 60 tablet 3  .  methocarbamol (ROBAXIN) 500 MG tablet Take 1 tablet (500 mg total) by mouth 4 (four) times daily as needed. For muscle spasms. 60 tablet 1  . Misc Natural Products (URINOZINC PLUS PO) Take 1 tablet by mouth daily.    . Multiple Vitamin (MULTIVITAMIN WITH MINERALS) TABS tablet Take 1 tablet by mouth daily. Men's 50+    . oxyCODONE (OXY IR/ROXICODONE) 5 MG immediate release tablet Take 1 tablet (5 mg total) by mouth every 4 (four) hours as needed for moderate pain or breakthrough pain. 40 tablet 0  . Plant Sterols and Stanols (CHOLESTOFF PLUS PO) Take 1 tablet by mouth daily.    Vladimir Faster Glycol-Propyl Glycol (SYSTANE) 0.4-0.3 % SOLN Place 1-2 drops into both eyes 4 (four) times daily as needed (for dry eyes.).    Marland Kitchen gabapentin (NEURONTIN) 300 MG  capsule Take 1 capsule by mouth 3 (three) times daily.  0  . promethazine (PHENERGAN) 12.5 MG tablet 1-2 tabs po q6h prn nausea (Patient not taking: Reported on 09/01/2017) 20 tablet 0   No facility-administered medications prior to visit.     Allergies  Allergen Reactions  . Prednisone Other (See Comments)    Headaches, muscle aches, fatigue    ROS As per HPI  PE: Blood pressure 108/71, pulse 62, temperature 98.1 F (36.7 C), temperature source Oral, resp. rate 16, height 5' 4.5" (1.638 m), weight 187 lb (84.8 kg), SpO2 94 %. Gen: Alert, well appearing.  Patient is oriented to person, place, time, and situation. AFFECT: pleasant, lucid thought and speech. CV: RRR, no m/r/g.   LUNGS: CTA bilat, nonlabored resps, good aeration in all lung fields. EXT: no clubbing, cyanosis, or edema.    LABS:   Lab Results  Component Value Date   LABURIC 5.8 06/01/2014    Lab Results  Component Value Date   TSH 1.49 09/26/2016   Lab Results  Component Value Date   WBC 11.7 (H) 07/21/2017   HGB 16.1 07/21/2017   HCT 45.9 07/21/2017   MCV 85.3 07/21/2017   PLT 225 07/21/2017   Lab Results  Component Value Date   CREATININE 0.95 07/21/2017   BUN 18 07/21/2017   NA 140 07/21/2017   K 4.7 07/21/2017   CL 101 07/21/2017   CO2 34 (H) 07/21/2017   Lab Results  Component Value Date   ALT 24 07/21/2017   AST 17 07/21/2017   ALKPHOS 73 09/26/2016   BILITOT 0.6 07/21/2017   Lab Results  Component Value Date   CHOL 200 05/30/2017   Lab Results  Component Value Date   HDL 51.40 05/30/2017   Lab Results  Component Value Date   LDLCALC 116 (H) 05/30/2017   Lab Results  Component Value Date   TRIG 163.0 (H) 05/30/2017   Lab Results  Component Value Date   CHOLHDL 4 05/30/2017   Lab Results  Component Value Date   PSA 1.04 09/26/2016   PSA 1.08 08/08/2015   PSA 1.56 06/01/2014   Lab Results  Component Value Date   HGBA1C 6.2 05/30/2017   IMPRESSION AND PLAN:  1)  DM 2: HbA1c today. Pt due for diab retpthy screening exam--pt aware and will schedule.  2) HTN: The current medical regimen is effective;  continue present plan and medications. BMET today.  3) HLD:  Statin intolerant but has not tried more than 1.  However, he continues to decline any further trials of this med despite being told the risk/benefit ratio.  An After Visit Summary was printed  and given to the patient.  FOLLOW UP: Return in about 3 months (around 11/30/2017) for annual CPE (fasting).  Signed:  Crissie Sickles, MD           09/01/2017

## 2017-09-29 ENCOUNTER — Ambulatory Visit: Payer: BLUE CROSS/BLUE SHIELD | Admitting: Family Medicine

## 2017-09-29 ENCOUNTER — Encounter: Payer: Self-pay | Admitting: Family Medicine

## 2017-09-29 VITALS — BP 131/76 | HR 62 | Temp 98.4°F | Resp 16 | Ht 64.5 in | Wt 184.0 lb

## 2017-09-29 DIAGNOSIS — H6123 Impacted cerumen, bilateral: Secondary | ICD-10-CM | POA: Diagnosis not present

## 2017-09-29 NOTE — Progress Notes (Signed)
OFFICE VISIT  09/29/2017   CC:  Chief Complaint  Patient presents with  . Ears Cleaned    HPI:    Patient is a 57 y.o.  male who presents for "wax build-up in both ears". Ears feel full and a little uncomfortable, mild decreased hearing x months.  Some drainage noted. No pain.   Past Medical History:  Diagnosis Date  . Asthma    "grew out of it", esp after quitting smoking arond age 33 yrs old  . Atypical chest pain 06/12/2014  . Blood in stool   . Carpal tunnel syndrome    bilat, L>>R (Dr. Vertell Limber).  Hand symptoms prompted referral to rheum--lab w/u NEG.  Wrist splinting x 6 wks advised, not much help so low dose prednisone rx'd, then pt had to get steroid injection into L carpal tunnel 12/2016.  Marland Kitchen Chronic low back pain   . Chronic neck pain   . Diabetes mellitus without complication ([place])    No diab retinop as of 12/2014  . Gout    Knee and toe--allopurinol since approx 2012 and has had no flares since being on this med (1/2 of 300 mg tab qd)  . Hyperkalemia 2015/2016   low K diet; cut lisinopril from 10mg  to 5mg  qd 11/2014.  Marland Kitchen Hyperlipidemia    med x 1 trial=adverse side effects so he stopped it.  LDL 130s 08/2014, then dx'd with DM 2 shortly after, pt refused another trial of statin 05/2017.  Marland Kitchen Hypertension   . Sleep apnea    has cpap but dosnt use much   . Umbilical hernia [id number]    Past Surgical History:  Procedure Laterality Date  . ANTERIOR CERVICAL DECOMP/DISCECTOMY FUSION N/A 03/07/2017   Procedure: Cervical four-five, Cervical five-six, Cerivcal six- seven Anterior cervical decompression/discectomy/fusion WITH CERVICAL FIVE COREPECTOMY;  Surgeon: Erline Levine, MD;  Location: [facility];  Service: Neurosurgery;  Laterality: N/A;  C4-5 C5-6 C6-7 Anterior cervical decompression/discectomy/fusion  . BACK SURGERY  12/05/2016   Lumbar, Dr. Vertell Limber at [facility] surgical center  . COLONOSCOPY W/ POLYPECTOMY  10/13/14   Hyperplastic; recall 10 yrs  . VASECTOMY    . WISDOM TOOTH  EXTRACTION      Outpatient Medications Prior to Visit  Medication Sig Dispense Refill  . allopurinol (ZYLOPRIM) 300 MG tablet TAKE ONE TABLET BY MOUTH ONCE DAILY (Patient taking differently: TAKE ONE-HALF TABLET (150 MG) BY MOUTH ONCE DAILY) 30 tablet 12  . aspirin EC 81 MG tablet Take 81 mg by mouth daily.    Marland Kitchen gabapentin (NEURONTIN) 600 MG tablet Take 1 tablet by mouth 2 (two) times daily.  0  . lisinopril (PRINIVIL,ZESTRIL) 5 MG tablet TAKE ONE TABLET BY MOUTH ONCE DAILY 90 tablet 1  . metFORMIN (GLUCOPHAGE) 500 MG tablet TAKE ONE TABLET BY MOUTH TWICE DAILY WITH MEALS 60 tablet 3  . methocarbamol (ROBAXIN) 500 MG tablet Take 1 tablet (500 mg total) by mouth 4 (four) times daily as needed. For muscle spasms. 60 tablet 1  . Multiple Vitamin (MULTIVITAMIN WITH MINERALS) TABS tablet Take 1 tablet by mouth daily. Men's 50+    . Plant Sterols and Stanols (CHOLESTOFF PLUS PO) Take 1 tablet by mouth daily.    Vladimir Faster Glycol-Propyl Glycol (SYSTANE) 0.4-0.3 % SOLN Place 1-2 drops into both eyes 4 (four) times daily as needed (for dry eyes.).    Marland Kitchen hydrochlorothiazide (HYDRODIURIL) 25 MG tablet TAKE 1 TABLET BY MOUTH ONCE DAILY (Patient not taking: Reported on 09/29/2017) 30 tablet 3  . ibuprofen (  ADVIL,MOTRIN) 200 MG tablet Take 400 mg by mouth every 6 (six) hours as needed (for pain relief (typically with methocarbamol)).    . Misc Natural Products (URINOZINC PLUS PO) Take 1 tablet by mouth daily.    Marland Kitchen oxyCODONE (OXY IR/ROXICODONE) 5 MG immediate release tablet Take 1 tablet (5 mg total) by mouth every 4 (four) hours as needed for moderate pain or breakthrough pain. (Patient not taking: Reported on 09/29/2017) 40 tablet 0   No facility-administered medications prior to visit.     Allergies  Allergen Reactions  . Prednisone Other (See Comments)    Headaches, muscle aches, fatigue    ROS As per HPI  PE: Blood pressure 131/76, pulse 62, temperature 98.4 F (36.9 C), temperature source Oral,  resp. rate 16, height 5' 4.5" (1.638 m), weight 184 lb (83.5 kg), SpO2 96 %. Gen: Alert, well appearing.  Patient is oriented to person, place, time, and situation. AFFECT: pleasant, lucid thought and speech. EARS: EACs with fair amount of cerumen ---completely removed with curette on R, TM perfect. Left EAC --extracted 85% of the cerumen with curette.  Remaining cerumen hard, adherent to wall of canal, hurt pt too much for me to attempt with curette. Nurse irrigated L ear as well and got no return of cerumen.  TM on L is perfect.  LABS:  none  IMPRESSION AND PLAN:  Bilat EAC cerumen impactions:  Removed on R w/curette. Removed on L w/curette but the residual against wall of TM would not come out with curette (+pt pain so I aborted quickly).  Nurse irrigation of L ear got no return.  Reassured pt that remaining cerumen was nothing that should be causing any sx's. Both ears felt improved before pt left office today.  An After Visit Summary was printed and given to the patient.  FOLLOW UP: No Follow-up on file.  Signed:  Crissie Sickles, MD           09/29/2017

## 2017-10-01 DIAGNOSIS — M542 Cervicalgia: Secondary | ICD-10-CM | POA: Diagnosis not present

## 2017-10-01 DIAGNOSIS — M4802 Spinal stenosis, cervical region: Secondary | ICD-10-CM | POA: Diagnosis not present

## 2017-10-01 DIAGNOSIS — M5020 Other cervical disc displacement, unspecified cervical region: Secondary | ICD-10-CM | POA: Diagnosis not present

## 2017-10-01 DIAGNOSIS — M5412 Radiculopathy, cervical region: Secondary | ICD-10-CM | POA: Diagnosis not present

## 2017-10-14 ENCOUNTER — Encounter: Payer: Self-pay | Admitting: Family Medicine

## 2017-10-18 ENCOUNTER — Other Ambulatory Visit: Payer: Self-pay | Admitting: Family Medicine

## 2017-10-20 NOTE — Telephone Encounter (Signed)
[facility] [place]  RF request for allopurinol LOV: 09/01/17 Next ov: 11/26/17 Last written: 09/13/16 #30 w/ 12RF  Please advise. Thanks.

## 2017-11-26 ENCOUNTER — Encounter: Payer: Self-pay | Admitting: Family Medicine

## 2017-11-26 ENCOUNTER — Ambulatory Visit (INDEPENDENT_AMBULATORY_CARE_PROVIDER_SITE_OTHER): Payer: BLUE CROSS/BLUE SHIELD | Admitting: Family Medicine

## 2017-11-26 VITALS — BP 129/86 | HR 57 | Temp 98.3°F | Resp 16 | Ht 64.5 in | Wt 185.2 lb

## 2017-11-26 DIAGNOSIS — E782 Mixed hyperlipidemia: Secondary | ICD-10-CM | POA: Diagnosis not present

## 2017-11-26 DIAGNOSIS — E119 Type 2 diabetes mellitus without complications: Secondary | ICD-10-CM

## 2017-11-26 DIAGNOSIS — Z125 Encounter for screening for malignant neoplasm of prostate: Secondary | ICD-10-CM | POA: Diagnosis not present

## 2017-11-26 DIAGNOSIS — Z0000 Encounter for general adult medical examination without abnormal findings: Secondary | ICD-10-CM

## 2017-11-26 DIAGNOSIS — I10 Essential (primary) hypertension: Secondary | ICD-10-CM

## 2017-11-26 LAB — LIPID PANEL
CHOLESTEROL: 196 mg/dL (ref 0–200)
HDL: 57.7 mg/dL (ref >39.00)
LDL Cholesterol: 113 mg/dL — ABNORMAL HIGH (ref 0–99)
NonHDL: 137.87
Total CHOL/HDL Ratio: 3
Triglycerides: 125 mg/dL (ref 0.0–149.0)
VLDL: 25 mg/dL (ref 0.0–40.0)

## 2017-11-26 LAB — COMPREHENSIVE METABOLIC PANEL
ALBUMIN: 4.2 g/dL (ref 3.5–5.2)
ALK PHOS: 59 U/L (ref 39–117)
ALT: 17 U/L (ref 0–53)
AST: 16 U/L (ref 0–37)
BUN: 28 mg/dL — ABNORMAL HIGH (ref 6–23)
CHLORIDE: 106 meq/L (ref 96–112)
CO2: 31 mEq/L (ref 19–32)
Calcium: 9.7 mg/dL (ref 8.4–10.5)
Creatinine, Ser: 0.9 mg/dL (ref 0.40–1.50)
GFR: 92.37 mL/min (ref >60.00)
GLUCOSE: 136 mg/dL — AB (ref 70–99)
POTASSIUM: 4.8 meq/L (ref 3.5–5.1)
Sodium: 142 mEq/L (ref 135–145)
TOTAL PROTEIN: 6.8 g/dL (ref 6.0–8.3)
Total Bilirubin: 0.5 mg/dL (ref 0.2–1.2)

## 2017-11-26 LAB — CBC WITH DIFFERENTIAL/PLATELET
BASOS PCT: 1.2 % (ref 0.0–3.0)
Basophils Absolute: 0.1 10*3/uL (ref 0.0–0.1)
Eosinophils Absolute: 0.3 10*3/uL (ref 0.0–0.7)
Eosinophils Relative: 3 % (ref 0.0–5.0)
HCT: 49.8 % (ref 39.0–52.0)
Hemoglobin: 16.6 g/dL (ref 13.0–17.0)
LYMPHS ABS: 2.2 10*3/uL (ref 0.7–4.0)
Lymphocytes Relative: 25.7 % (ref 12.0–46.0)
MCHC: 33.3 g/dL (ref 30.0–36.0)
MCV: 88.9 fl (ref 78.0–100.0)
MONOS PCT: 6.4 % (ref 3.0–12.0)
Monocytes Absolute: 0.5 10*3/uL (ref 0.1–1.0)
NEUTROS PCT: 63.7 % (ref 43.0–77.0)
Neutro Abs: 5.3 10*3/uL (ref 1.4–7.7)
Platelets: 205 10*3/uL (ref 150.0–400.0)
RBC: 5.6 Mil/uL (ref 4.22–5.81)
RDW: 14.5 % (ref 11.5–15.5)
WBC: 8.4 10*3/uL (ref 4.0–10.5)

## 2017-11-26 LAB — PSA: PSA: 1.14 ng/mL (ref 0.10–4.00)

## 2017-11-26 LAB — TSH: TSH: 1.35 u[IU]/mL (ref 0.35–4.50)

## 2017-11-26 LAB — HEMOGLOBIN A1C: Hgb A1c MFr Bld: 5.8 % (ref 4.6–6.5)

## 2017-11-26 NOTE — Patient Instructions (Signed)

## 2017-11-26 NOTE — Progress Notes (Signed)
Office Note 11/26/2017  CC:  Chief Complaint  Patient presents with  . Annual Exam    Pt is fasting.     HPI:  Derrick Willis is a 57 y.o. White male who is here for annual health maintenance exam.  Feet: no burning, tingling, or numbness.  Diet: cutting back on sweets, fat meats, colas. Exercise: walking several days per week but nothing more vigorous.  Wt is stable but he is somewhat frustrated with not losing any wt.  No acute complaint.   Past Medical History:  Diagnosis Date  . Asthma    "grew out of it", esp after quitting smoking arond age 89 yrs old  . Atypical chest pain 06/12/2014  . Blood in stool   . Carpal tunnel syndrome    bilat, L>>R (Dr. Vertell Limber).  Hand symptoms prompted referral to rheum--lab w/u NEG.  Wrist splinting x 6 wks advised, not much help so low dose prednisone rx'd, then pt had to get steroid injection into L carpal tunnel 12/2016.  Marland Kitchen Chronic low back pain   . Chronic neck pain    Surgical fusion--Dr Vertell Limber  . Diabetes mellitus without complication ([place])    No diab retinop as of 12/2014  . Gout    Knee and toe--allopurinol since approx 2012 and has had no flares since being on this med (1/2 of 300 mg tab qd)  . Hyperkalemia 2015/2016   low K diet; cut lisinopril from 10mg  to 5mg  qd 11/2014.  Marland Kitchen Hyperlipidemia    med x 1 trial=adverse side effects so he stopped it.  LDL 130s 08/2014, then dx'd with DM 2 shortly after, pt refused another trial of statin 05/2017.  Marland Kitchen Hypertension   . Sleep apnea    has cpap but dosnt use much   . Umbilical hernia [id number]    Past Surgical History:  Procedure Laterality Date  . ANTERIOR CERVICAL DECOMP/DISCECTOMY FUSION N/A 03/07/2017   Procedure: Cervical four-five, Cervical five-six, Cerivcal six- seven Anterior cervical decompression/discectomy/fusion WITH CERVICAL FIVE COREPECTOMY;  Surgeon: Erline Levine, MD;  Location: [facility];  Service: Neurosurgery;  Laterality: N/A;  C4-5 C5-6 C6-7 Anterior cervical  decompression/discectomy/fusion  . BACK SURGERY  12/05/2016   Lumbar, Dr. Vertell Limber at [facility] surgical center  . COLONOSCOPY W/ POLYPECTOMY  10/13/14   Hyperplastic; recall 10 yrs  . VASECTOMY    . WISDOM TOOTH EXTRACTION      Family History  Problem Relation Age of Onset  . Arthritis Mother   . Arthritis Father   . Hyperlipidemia Father   . Hypertension Father   . Colon polyps Neg Hx   . Esophageal cancer Neg Hx   . Rectal cancer Neg Hx   . Stomach cancer Neg Hx     Social History   Socioeconomic History  . Marital status: Legally Separated    Spouse name: Not on file  . Number of children: Not on file  . Years of education: Not on file  . Highest education level: Not on file  Occupational History  . Not on file  Social Needs  . Financial resource strain: Not on file  . Food insecurity:    Worry: Not on file    Inability: Not on file  . Transportation needs:    Medical: Not on file    Non-medical: Not on file  Tobacco Use  . Smoking status: Former Smoker    Packs/day: 1.50    Years: 12.00    Pack years: 18.00    Types:  Cigars    Last attempt to quit: 05/31/1990    Years since quitting: 27.5  . Smokeless tobacco: Never Used  . Tobacco comment: Quit smoking Cigars 10/2015  Substance and Sexual Activity  . Alcohol use: Yes    Alcohol/week: 0.6 - 1.2 oz    Types: 1 - 2 Glasses of wine per week    Comment: 3 x weekly  . Drug use: No  . Sexual activity: Not on file  Lifestyle  . Physical activity:    Days per week: Not on file    Minutes per session: Not on file  . Stress: Not on file  Relationships  . Social connections:    Talks on phone: Not on file    Gets together: Not on file    Attends religious service: Not on file    Active member of club or organization: Not on file    Attends meetings of clubs or organizations: Not on file    Relationship status: Not on file  . Intimate partner violence:    Fear of current or ex partner: Not on file     Emotionally abused: Not on file    Physically abused: Not on file    Forced sexual activity: Not on file  Other Topics Concern  . Not on file  Social History Narrative   Divorced.  Two children, both young adults (one son plans to go to med school).   Occupation: Grading contractor-also a Psychologist, sport and exercise.   Education: 2 years of college.   Orig from [place], still lives there.   Tob 30 pack-yr hx, quit around age 3.   Social drinker.     No regular exercise.  Eating habits poor.    Outpatient Medications Prior to Visit  Medication Sig Dispense Refill  . allopurinol (ZYLOPRIM) 300 MG tablet TAKE ONE TABLET BY MOUTH ONCE DAILY 30 tablet 12  . aspirin EC 81 MG tablet Take 81 mg by mouth daily.    Marland Kitchen gabapentin (NEURONTIN) 600 MG tablet Take 1 tablet by mouth 2 (two) times daily.  0  . lisinopril (PRINIVIL,ZESTRIL) 5 MG tablet TAKE ONE TABLET BY MOUTH ONCE DAILY 90 tablet 1  . metFORMIN (GLUCOPHAGE) 500 MG tablet TAKE ONE TABLET BY MOUTH TWICE DAILY WITH MEALS 60 tablet 3  . Multiple Vitamin (MULTIVITAMIN WITH MINERALS) TABS tablet Take 1 tablet by mouth daily. Men's 50+    . Plant Sterols and Stanols (CHOLESTOFF PLUS PO) Take 1 tablet by mouth daily.    Vladimir Faster Glycol-Propyl Glycol (SYSTANE) 0.4-0.3 % SOLN Place 1-2 drops into both eyes 4 (four) times daily as needed (for dry eyes.).    Marland Kitchen baclofen (LIORESAL) 10 MG tablet Take 1 tablet by mouth 3 (three) times daily as needed.  1  . methocarbamol (ROBAXIN) 500 MG tablet Take 1 tablet (500 mg total) by mouth 4 (four) times daily as needed. For muscle spasms. (Patient not taking: Reported on 11/26/2017) 60 tablet 1   No facility-administered medications prior to visit.     Allergies  Allergen Reactions  . Prednisone Other (See Comments)    Headaches, muscle aches, fatigue    ROS Review of Systems  Constitutional: Negative for appetite change, chills, fatigue and fever.  HENT: Negative for congestion, dental problem, ear pain and sore  throat.   Eyes: Negative for discharge, redness and visual disturbance.  Respiratory: Negative for cough, chest tightness, shortness of breath and wheezing.   Cardiovascular: Negative for chest pain, palpitations and leg swelling.  Gastrointestinal: Negative for abdominal pain, blood in stool, diarrhea, nausea and vomiting.  Genitourinary: Negative for difficulty urinating, dysuria, flank pain, frequency, hematuria and urgency.  Musculoskeletal: Negative for arthralgias, back pain, joint swelling, myalgias and neck stiffness.  Skin: Negative for pallor and rash.  Neurological: Negative for dizziness, speech difficulty, weakness and headaches.  Hematological: Negative for adenopathy. Does not bruise/bleed easily.  Psychiatric/Behavioral: Negative for confusion and sleep disturbance. The patient is not nervous/anxious.     PE; Blood pressure 129/86, pulse (!) 57, temperature 98.3 F (36.8 C), temperature source Oral, resp. rate 16, height 5' 4.5" (1.638 m), weight 185 lb 4 oz (84 kg), SpO2 99 %. Gen: Alert, well appearing.  Patient is oriented to person, place, time, and situation. AFFECT: pleasant, lucid thought and speech. ENT: Ears: EACs clear, normal epithelium.  TMs with good light reflex and landmarks bilaterally.  Eyes: no injection, icteris, swelling, or exudate.  EOMI, PERRLA. Nose: no drainage or turbinate edema/swelling.  No injection or focal lesion.  Mouth: lips without lesion/swelling.  Oral mucosa pink and moist.  Dentition intact and without obvious caries or gingival swelling.  Oropharynx without erythema, exudate, or swelling.  Neck: supple/nontender.  No LAD, mass, or TM.  Carotid pulses 2+ bilaterally, without bruits. CV: RRR, no m/r/g.   LUNGS: CTA bilat, nonlabored resps, good aeration in all lung fields. ABD: soft, NT, ND, BS normal.  No hepatospenomegaly or mass.  No bruits. EXT: no clubbing, cyanosis, or edema.  Musculoskeletal: no joint swelling, erythema, warmth, or  tenderness.  ROM of all joints intact. Skin - no sores or suspicious lesions or rashes or color changes Rectal exam: negative without mass, lesions or tenderness, PROSTATE EXAM: smooth and symmetric without nodules or tenderness.   Pertinent labs:  Lab Results  Component Value Date   TSH 1.49 09/26/2016   Lab Results  Component Value Date   WBC 11.7 (H) 07/21/2017   HGB 16.1 07/21/2017   HCT 45.9 07/21/2017   MCV 85.3 07/21/2017   PLT 225 07/21/2017   Lab Results  Component Value Date   CREATININE 0.96 09/01/2017   BUN 24 (H) 09/01/2017   NA 138 09/01/2017   K 4.2 09/01/2017   CL 99 09/01/2017   CO2 30 09/01/2017   Lab Results  Component Value Date   ALT 24 07/21/2017   AST 17 07/21/2017   ALKPHOS 73 09/26/2016   BILITOT 0.6 07/21/2017   Lab Results  Component Value Date   CHOL 200 05/30/2017   Lab Results  Component Value Date   HDL 51.40 05/30/2017   Lab Results  Component Value Date   LDLCALC 116 (H) 05/30/2017   Lab Results  Component Value Date   TRIG 163.0 (H) 05/30/2017   Lab Results  Component Value Date   CHOLHDL 4 05/30/2017   Lab Results  Component Value Date   PSA 1.04 09/26/2016   PSA 1.08 08/08/2015   PSA 1.56 06/01/2014   Lab Results  Component Value Date   HGBA1C 6.6 (H) 09/01/2017    ASSESSMENT AND PLAN:   Health maintenance exam: Reviewed age and gender appropriate health maintenance issues (prudent diet, regular exercise, health risks of tobacco and excessive alcohol, use of seatbelts, fire alarms in home, use of sunscreen).  Also reviewed age and gender appropriate health screening as well as vaccine recommendations. Vaccines: all UTD.  Shingrix discussed-- declined. Labs: fasting HP, A1c, PSA. Prostate ca screening: DRE normal , PSA. Colon ca screening: next colonoscopy due 2026.  An After Visit Summary was printed and given to the patient.  FOLLOW UP:  Return in about 3 months (around 02/25/2018) for routine chronic  illness f/u.  Signed:  Crissie Sickles, MD           11/26/2017

## 2017-11-27 ENCOUNTER — Encounter: Payer: Self-pay | Admitting: *Deleted

## 2017-11-28 DIAGNOSIS — E119 Type 2 diabetes mellitus without complications: Secondary | ICD-10-CM | POA: Diagnosis not present

## 2017-11-28 DIAGNOSIS — H10413 Chronic giant papillary conjunctivitis, bilateral: Secondary | ICD-10-CM | POA: Diagnosis not present

## 2017-11-28 LAB — HM DIABETES EYE EXAM

## 2017-12-08 ENCOUNTER — Encounter: Payer: Self-pay | Admitting: Family Medicine

## 2018-01-07 DIAGNOSIS — M542 Cervicalgia: Secondary | ICD-10-CM | POA: Diagnosis not present

## 2018-01-07 DIAGNOSIS — I10 Essential (primary) hypertension: Secondary | ICD-10-CM | POA: Diagnosis not present

## 2018-01-07 DIAGNOSIS — M4802 Spinal stenosis, cervical region: Secondary | ICD-10-CM | POA: Diagnosis not present

## 2018-01-07 DIAGNOSIS — M5412 Radiculopathy, cervical region: Secondary | ICD-10-CM | POA: Diagnosis not present

## 2018-01-20 ENCOUNTER — Encounter: Payer: Self-pay | Admitting: Family Medicine

## 2018-01-22 ENCOUNTER — Other Ambulatory Visit (HOSPITAL_COMMUNITY): Payer: Self-pay | Admitting: Neurosurgery

## 2018-01-22 DIAGNOSIS — M5412 Radiculopathy, cervical region: Secondary | ICD-10-CM

## 2018-01-29 ENCOUNTER — Ambulatory Visit (HOSPITAL_COMMUNITY)
Admission: RE | Admit: 2018-01-29 | Discharge: 2018-01-29 | Disposition: A | Discharge status: Home / Self Care | Payer: BLUE CROSS/BLUE SHIELD | Source: Ambulatory Visit | Attending: Neurosurgery | Admitting: Neurosurgery

## 2018-01-29 DIAGNOSIS — G9589 Other specified diseases of spinal cord: Secondary | ICD-10-CM | POA: Diagnosis not present

## 2018-01-29 DIAGNOSIS — M5412 Radiculopathy, cervical region: Secondary | ICD-10-CM

## 2018-01-29 DIAGNOSIS — M542 Cervicalgia: Secondary | ICD-10-CM | POA: Diagnosis not present

## 2018-01-29 DIAGNOSIS — Z981 Arthrodesis status: Secondary | ICD-10-CM | POA: Diagnosis not present

## 2018-01-29 DIAGNOSIS — M5011 Cervical disc disorder with radiculopathy,  high cervical region: Secondary | ICD-10-CM | POA: Insufficient documentation

## 2018-01-29 DIAGNOSIS — M4802 Spinal stenosis, cervical region: Secondary | ICD-10-CM | POA: Insufficient documentation

## 2018-02-09 DIAGNOSIS — Z6830 Body mass index (BMI) 30.0-30.9, adult: Secondary | ICD-10-CM | POA: Diagnosis not present

## 2018-02-09 DIAGNOSIS — G959 Disease of spinal cord, unspecified: Secondary | ICD-10-CM | POA: Diagnosis not present

## 2018-02-09 DIAGNOSIS — I10 Essential (primary) hypertension: Secondary | ICD-10-CM | POA: Diagnosis not present

## 2018-02-12 ENCOUNTER — Encounter: Payer: Self-pay | Admitting: Family Medicine

## 2018-03-06 ENCOUNTER — Encounter: Payer: Self-pay | Admitting: Family Medicine

## 2018-03-06 ENCOUNTER — Ambulatory Visit: Payer: BLUE CROSS/BLUE SHIELD | Admitting: Family Medicine

## 2018-03-06 VITALS — BP 120/68 | HR 60 | Temp 98.2°F | Resp 16 | Ht 64.5 in | Wt 186.2 lb

## 2018-03-06 DIAGNOSIS — I10 Essential (primary) hypertension: Secondary | ICD-10-CM | POA: Diagnosis not present

## 2018-03-06 DIAGNOSIS — E7800 Pure hypercholesterolemia, unspecified: Secondary | ICD-10-CM

## 2018-03-06 DIAGNOSIS — M1000 Idiopathic gout, unspecified site: Secondary | ICD-10-CM

## 2018-03-06 DIAGNOSIS — E119 Type 2 diabetes mellitus without complications: Secondary | ICD-10-CM | POA: Diagnosis not present

## 2018-03-06 LAB — POCT GLYCOSYLATED HEMOGLOBIN (HGB A1C): HBA1C, POC (CONTROLLED DIABETIC RANGE): 5.8 % (ref 0.0–7.0)

## 2018-03-06 NOTE — Progress Notes (Signed)
OFFICE VISIT  03/06/2018   CC:  Chief Complaint  Patient presents with  . Follow-up    RCI, pt is fasting.      HPI:    Patient is a 57 y.o. Caucasian male who presents for 3 mo f/u DM 2, HTN, gout.  DM: last A1c was so good that we decreased his metformin to ONE 500mg  tab daily. He states he actually d/c'd his metformin entirely. No home gluc monitoring, diet pretty good still. Exercising again. Very busy with work.  HTN: bp's <130/80 consistently on lisinopril 5mg  qd.  Gout: no flares since getting on allopurinol.  ROS: no melena, no polyuria, no polydipsia, no CP, no SOB, no joint pain or swelling, no HAs, no dizziness.  Past Medical History:  Diagnosis Date  . Asthma    "grew out of it", esp after quitting smoking arond age 66 yrs old  . Atypical chest pain 06/12/2014  . Blood in stool   . Carpal tunnel syndrome    bilat, L>>R (Dr. Vertell Limber).  Hand symptoms prompted referral to rheum--lab w/u NEG.  Wrist splinting x 6 wks advised, not much help so low dose prednisone rx'd, then pt had to get steroid injection into L carpal tunnel 12/2016.  Marland Kitchen Chronic low back pain   . Chronic neck pain    Surgical fusion--Dr Vertell Limber (cerv spinal stenosis)  . Diabetes mellitus without complication ([place])    No diab retinop as of 12/2014  . Gout    Knee and toe--allopurinol since approx 2012 and has had no flares since being on this med (1/2 of 300 mg tab qd)  . Hyperkalemia 2015/2016   low K diet; cut lisinopril from 10mg  to 5mg  qd 11/2014.  Marland Kitchen Hyperlipidemia    med x 1 trial=adverse side effects so he stopped it.  LDL 130s 08/2014, then dx'd with DM 2 shortly after, pt refused another trial of statin 05/2017.  Marland Kitchen Hypertension   . Paresthesia of both hands    Dr. Vertell Limber: MRI 01/2018: no acute abnormality.  Stable-to-improved chronic spine/spinal cord changes, fusion good.  . Sleep apnea    has cpap but dosnt use much   . Umbilical hernia [id number]    Past Surgical History:  Procedure Laterality  Date  . ANTERIOR CERVICAL DECOMP/DISCECTOMY FUSION N/A 03/07/2017   Procedure: Cervical four-five, Cervical five-six, Cerivcal six- seven Anterior cervical decompression/discectomy/fusion WITH CERVICAL FIVE COREPECTOMY;  Surgeon: Erline Levine, MD;  Location: [place];  Service: Neurosurgery;  Laterality: N/A;  C4-5 C5-6 C6-7 Anterior cervical decompression/discectomy/fusion  . BACK SURGERY  12/05/2016   Lumbar, Dr. Vertell Limber at [facility]  . COLONOSCOPY W/ POLYPECTOMY  10/13/14   Hyperplastic; recall 10 yrs  . VASECTOMY    . WISDOM TOOTH EXTRACTION      Outpatient Medications Prior to Visit  Medication Sig Dispense Refill  . allopurinol (ZYLOPRIM) 300 MG tablet TAKE ONE TABLET BY MOUTH ONCE DAILY 30 tablet 12  . aspirin EC 81 MG tablet Take 81 mg by mouth daily.    . baclofen (LIORESAL) 10 MG tablet Take 1 tablet by mouth 3 (three) times daily as needed.  1  . lisinopril (PRINIVIL,ZESTRIL) 5 MG tablet TAKE ONE TABLET BY MOUTH ONCE DAILY 90 tablet 1  . Polyethyl Glycol-Propyl Glycol (SYSTANE) 0.4-0.3 % SOLN Place 1-2 drops into both eyes 4 (four) times daily as needed (for dry eyes.).    Marland Kitchen gabapentin (NEURONTIN) 600 MG tablet Take 1 tablet by mouth 2 (two) times daily.  0  .  metFORMIN (GLUCOPHAGE) 500 MG tablet TAKE ONE TABLET BY MOUTH TWICE DAILY WITH MEALS (Patient not taking: Reported on 03/06/2018) 60 tablet 3  . Multiple Vitamin (MULTIVITAMIN WITH MINERALS) TABS tablet Take 1 tablet by mouth daily. Men's 50+    . Plant Sterols and Stanols (CHOLESTOFF PLUS PO) Take 1 tablet by mouth daily.     No facility-administered medications prior to visit.     Allergies  Allergen Reactions  . Prednisone Other (See Comments)    Headaches, muscle aches, fatigue    ROS As per HPI  PE: Blood pressure 120/68, pulse 60, temperature 98.2 F (36.8 C), temperature source Oral, resp. rate 16, height 5' 4.5" (1.638 m), weight 186 lb 4 oz (84.5 kg), SpO2 97 %. Body mass index is 31.48  kg/m.  Gen: Alert, well appearing.  Patient is oriented to person, place, time, and situation. AFFECT: pleasant, lucid thought and speech. CV: RRR, no m/r/g.   LUNGS: CTA bilat, nonlabored resps, good aeration in all lung fields. EXT: no clubbing, cyanosis, or edema.    LABS:  Lab Results  Component Value Date   TSH 1.35 11/26/2017   Lab Results  Component Value Date   WBC 8.4 11/26/2017   HGB 16.6 11/26/2017   HCT 49.8 11/26/2017   MCV 88.9 11/26/2017   PLT 205.0 11/26/2017   Lab Results  Component Value Date   CREATININE 0.90 11/26/2017   BUN 28 (H) 11/26/2017   NA 142 11/26/2017   K 4.8 11/26/2017   CL 106 11/26/2017   CO2 31 11/26/2017   Lab Results  Component Value Date   ALT 17 11/26/2017   AST 16 11/26/2017   ALKPHOS 59 11/26/2017   BILITOT 0.5 11/26/2017   Lab Results  Component Value Date   CHOL 196 11/26/2017   Lab Results  Component Value Date   HDL 57.70 11/26/2017   Lab Results  Component Value Date   LDLCALC 113 (H) 11/26/2017   Lab Results  Component Value Date   TRIG 125.0 11/26/2017   Lab Results  Component Value Date   CHOLHDL 3 11/26/2017   Lab Results  Component Value Date   PSA 1.14 11/26/2017   PSA 1.04 09/26/2016   PSA 1.08 08/08/2015   Lab Results  Component Value Date   HGBA1C 5.8 03/06/2018   Lab Results  Component Value Date   LABURIC 5.8 06/01/2014   POC HbA1c today= 5.8%  IMPRESSION AND PLAN:  1) DM 2: great control OFF of meds! HbA1c 5.8% today. Continue to work on diet/exercise.  2) HTN: The current medical regimen is effective;  continue present plan and medications. Lytes/cr good 3 mo ago.  Will repeat at next f/u in 6 mo.  3) Gout: well controlled---continue allopurinol.  4) HLD: working on diet/exercise.  Pt intol of statins (declines further trial of this type of med).  An After Visit Summary was printed and given to the patient.  FOLLOW UP: Return in about 6 months (around 09/06/2018) for  routine chronic illness f/u.  Signed:  Crissie Sickles, MD           03/06/2018

## 2018-03-15 ENCOUNTER — Other Ambulatory Visit: Payer: Self-pay | Admitting: Family Medicine

## 2018-08-10 DIAGNOSIS — I10 Essential (primary) hypertension: Secondary | ICD-10-CM | POA: Diagnosis not present

## 2018-08-10 DIAGNOSIS — M4802 Spinal stenosis, cervical region: Secondary | ICD-10-CM | POA: Diagnosis not present

## 2018-08-10 DIAGNOSIS — G959 Disease of spinal cord, unspecified: Secondary | ICD-10-CM | POA: Diagnosis not present

## 2018-08-10 DIAGNOSIS — Z6831 Body mass index (BMI) 31.0-31.9, adult: Secondary | ICD-10-CM | POA: Diagnosis not present

## 2018-08-19 ENCOUNTER — Encounter: Payer: Self-pay | Admitting: Family Medicine

## 2018-09-07 ENCOUNTER — Ambulatory Visit: Payer: BLUE CROSS/BLUE SHIELD | Admitting: Family Medicine

## 2018-09-15 ENCOUNTER — Ambulatory Visit: Payer: BLUE CROSS/BLUE SHIELD | Admitting: Family Medicine

## 2018-09-15 ENCOUNTER — Encounter: Payer: Self-pay | Admitting: Family Medicine

## 2018-09-15 VITALS — BP 134/76 | HR 61 | Temp 98.2°F | Resp 16 | Ht 64.5 in | Wt 190.0 lb

## 2018-09-15 DIAGNOSIS — E669 Obesity, unspecified: Secondary | ICD-10-CM | POA: Diagnosis not present

## 2018-09-15 DIAGNOSIS — Z8639 Personal history of other endocrine, nutritional and metabolic disease: Secondary | ICD-10-CM | POA: Diagnosis not present

## 2018-09-15 DIAGNOSIS — I10 Essential (primary) hypertension: Secondary | ICD-10-CM | POA: Diagnosis not present

## 2018-09-15 DIAGNOSIS — E7800 Pure hypercholesterolemia, unspecified: Secondary | ICD-10-CM

## 2018-09-15 DIAGNOSIS — E119 Type 2 diabetes mellitus without complications: Secondary | ICD-10-CM | POA: Diagnosis not present

## 2018-09-15 LAB — BASIC METABOLIC PANEL
BUN: 24 mg/dL — AB (ref 6–23)
CHLORIDE: 105 meq/L (ref 96–112)
CO2: 30 meq/L (ref 19–32)
Calcium: 9.6 mg/dL (ref 8.4–10.5)
Creatinine, Ser: 0.95 mg/dL (ref 0.40–1.50)
GFR: 81.42 mL/min (ref >60.00)
GLUCOSE: 152 mg/dL — AB (ref 70–99)
Potassium: 4.7 mEq/L (ref 3.5–5.1)
SODIUM: 142 meq/L (ref 135–145)

## 2018-09-15 LAB — HEMOGLOBIN A1C: Hgb A1c MFr Bld: 6.3 % (ref 4.6–6.5)

## 2018-09-15 NOTE — Progress Notes (Signed)
OFFICE VISIT  09/15/2018   CC:  Chief Complaint  Patient presents with  . Follow-up    RCI, pt is fasting.     HPI:    Patient is a 58 y.o. Caucasian male who presents for 6 mo f/u DM 2, HTN, hx of hyperkalemia (which improved with low K diet + decreasing lisinopril dose by 50%)  DM: last visit he had been able to ween himself completely off metformin and his A1c was excellent (<6%). Diet has not been as good the last 3 mo, says he gets this way in the winter. 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  He says eating is his crutch.  Says this is improving lately though.  HTN:  Home bp monitoring is 120s/70s consistently.  Hx of hyperkalemia: he continues to eat low K diet.   Past Medical History:  Diagnosis Date  . Asthma    "grew out of it", esp after quitting smoking arond age 89 yrs old  . Atypical chest pain 06/12/2014  . Blood in stool   . Carpal tunnel syndrome    bilat, L>>R (Dr. Vertell Limber).  Hand symptoms prompted referral to rheum--lab w/u NEG.  Wrist splinting x 6 wks advised, not much help so low dose prednisone rx'd, then pt had to get steroid injection into L carpal tunnel 12/2016.  Marland Kitchen Cervical myelopathy [facility])    Surgical fusion--Dr Vertell Limber (cerv spinal stenosis).  Stable as of 07/2018 neurosurg f/u.  Marland Kitchen Chronic low back pain   . Diabetes mellitus without complication ([place])    No diab retinop as of 12/2014  . Gout    Knee and toe--allopurinol since approx 2012 and has had no flares since being on this med (1/2 of 300 mg tab qd)  . Hyperkalemia 2015/2016   low K diet; cut lisinopril from 10mg  to 5mg  qd 11/2014.  Marland Kitchen Hyperlipidemia    med x 1 trial=adverse side effects so he stopped it.  LDL 130s 08/2014, then dx'd with DM 2 shortly after, pt refused another trial of statin 05/2017.  Marland Kitchen Hypertension   . Paresthesia of both hands    Dr. Vertell Limber: MRI 01/2018: no acute abnormality.  Stable-to-improved chronic spine/spinal cord  changes, fusion good.  . Sleep apnea    has cpap but dosnt use much   . Umbilical hernia 0626    Past Surgical History:  Procedure Laterality Date  . ANTERIOR CERVICAL DECOMP/DISCECTOMY FUSION N/A 03/07/2017   Procedure: Cervical four-five, Cervical five-six, Cerivcal six- seven Anterior cervical decompression/discectomy/fusion WITH CERVICAL FIVE COREPECTOMY;  Surgeon: Erline Levine, MD;  Location: [place];  Service: Neurosurgery;  Laterality: N/A;  C4-5 C5-6 C6-7 Anterior cervical decompression/discectomy/fusion  . BACK SURGERY  12/05/2016   Lumbar, Dr. Vertell Limber at [facility] surgical center  . COLONOSCOPY W/ POLYPECTOMY  10/13/14   Hyperplastic; recall 10 yrs  . VASECTOMY    . WISDOM TOOTH EXTRACTION      Outpatient Medications Prior to Visit  Medication Sig Dispense Refill  . allopurinol (ZYLOPRIM) 300 MG tablet TAKE ONE TABLET BY MOUTH ONCE DAILY 30 tablet 12  . aspirin EC 81 MG tablet Take 81 mg by mouth daily.    . baclofen (LIORESAL) 10 MG tablet Take 1 tablet by mouth 3 (three) times daily as needed.  1  . lisinopril (PRINIVIL,ZESTRIL) 5 MG tablet TAKE ONE TABLET BY MOUTH ONCE DAILY 90 tablet 1  . Polyethyl Glycol-Propyl Glycol (SYSTANE) 0.4-0.3 % SOLN Place 1-2 drops  into both eyes 4 (four) times daily as needed (for dry eyes.).    Marland Kitchen lisinopril (PRINIVIL,ZESTRIL) 5 MG tablet TAKE ONE TABLET BY MOUTH ONCE DAILY (Patient not taking: Reported on 09/15/2018) 90 tablet 1   No facility-administered medications prior to visit.     Allergies  Allergen Reactions  . Prednisone Other (See Comments)    Headaches, muscle aches, fatigue    ROS As per HPI  PE: Blood pressure 134/76, pulse 61, temperature 98.2 F (36.8 C), temperature source Oral, resp. rate 16, height 5' 4.5" (1.638 m), weight 190 lb (86.2 kg), SpO2 98 %. Gen: Alert, well appearing.  Patient is oriented to person, place, time, and situation. AFFECT: pleasant, lucid thought and speech. CV: RRR, no m/r/g.   LUNGS: CTA  bilat, nonlabored resps, good aeration in all lung fields. EXT: no clubbing or cyanosis.  no edema.    LABS:  Lab Results  Component Value Date   TSH 1.35 11/26/2017   Lab Results  Component Value Date   WBC 8.4 11/26/2017   HGB 16.6 11/26/2017   HCT 49.8 11/26/2017   MCV 88.9 11/26/2017   PLT 205.0 11/26/2017   Lab Results  Component Value Date   CREATININE 0.90 11/26/2017   BUN 28 (H) 11/26/2017   NA 142 11/26/2017   K 4.8 11/26/2017   CL 106 11/26/2017   CO2 31 11/26/2017   Lab Results  Component Value Date   ALT 17 11/26/2017   AST 16 11/26/2017   ALKPHOS 59 11/26/2017   BILITOT 0.5 11/26/2017   Lab Results  Component Value Date   CHOL 196 11/26/2017   Lab Results  Component Value Date   HDL 57.70 11/26/2017   Lab Results  Component Value Date   LDLCALC 113 (H) 11/26/2017   Lab Results  Component Value Date   TRIG 125.0 11/26/2017   Lab Results  Component Value Date   CHOLHDL 3 11/26/2017   Lab Results  Component Value Date   PSA 1.14 11/26/2017   PSA 1.04 09/26/2016   PSA 1.08 08/08/2015   Lab Results  Component Value Date   HGBA1C 5.8 03/06/2018    IMPRESSION AND PLAN:  1) DM 2; The current medical regimen is effective;  continue present plan and medications. NO MEDS at this time. If A1c stable, will have him come back in 6 mo for his CPE.  2) HTN: The current medical regimen is effective;  continue present plan and medications. BMET today.  3) Hx of hyperkalemia: continue low K diet. BP still well controlled on his lower dosing of lisinopril. BMET today.  4) Hyperlipidemia: mild historically, but would like pt on statin due to dx of DM.  However, he did not tolerate a statin in the past and since then has not been willing to try a different one.  An After Visit Summary was printed and given to the patient.  FOLLOW UP: Return in about 6 months (around 03/16/2019) for annual CPE (fasting).  Signed:  Crissie Sickles, MD            09/15/2018

## 2018-09-24 IMAGING — MR MR CERVICAL SPINE W/O CM
4 of 5 series · 21 of 48 positions shown · non-contrast
Comparison: Previous radiograph from 05/14/2017 and MRI from
02/27/2017.

CLINICAL DATA: Initial evaluation for neck pain radiating into both
arms for 3 months. History of prior cervical fusion.

EXAM:
MRI CERVICAL SPINE WITHOUT CONTRAST
TECHNIQUE: Multiplanar, multisequence MR imaging of the cervical spine was
performed. No intravenous contrast was administered.

[Series 3: T2 · sagittal · 3.0mm · 0.62mm/px · 5 of 13 slices shown (1 of 2)]
[im 1/13]
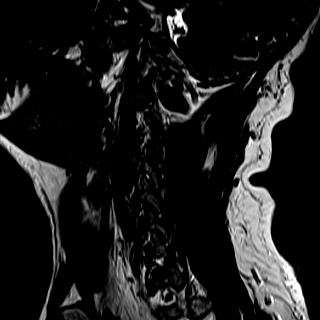
[im 4/13]
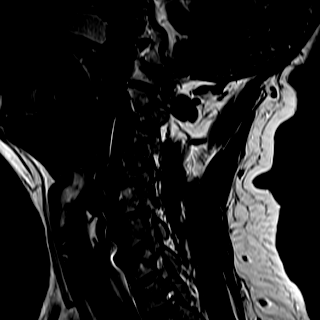
[im 7/13]
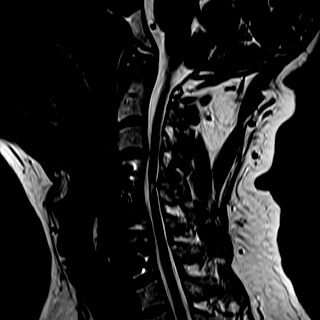
[im 10/13]
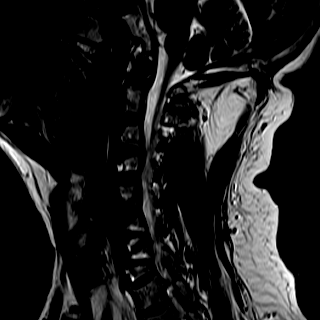
[im 13/13]
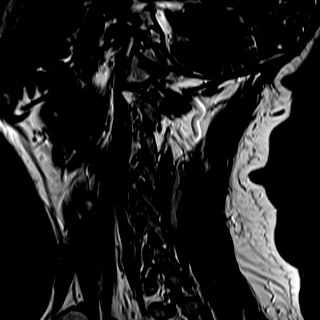

[Series 4: FLAIR · sagittal · 3.0mm · 0.62mm/px · 3 of 13 slices shown]
[im 1/13]
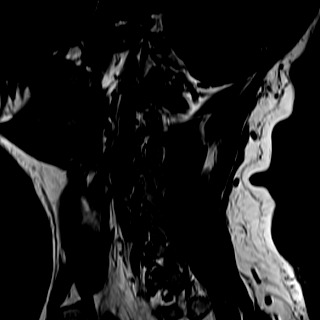
[im 7/13]
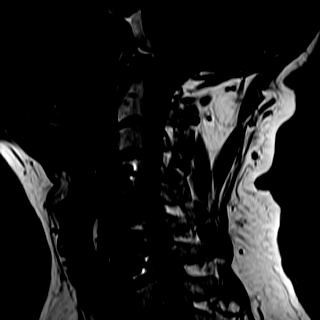
[im 13/13]
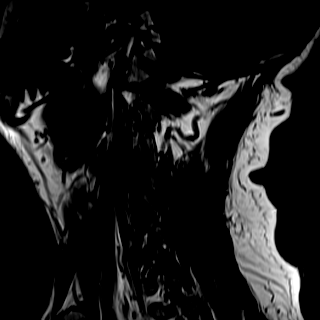

[Series 5: ir sagital · sagittal · 3.0mm · 0.31mm/px · 3 of 13 slices shown]
[im 3/13]
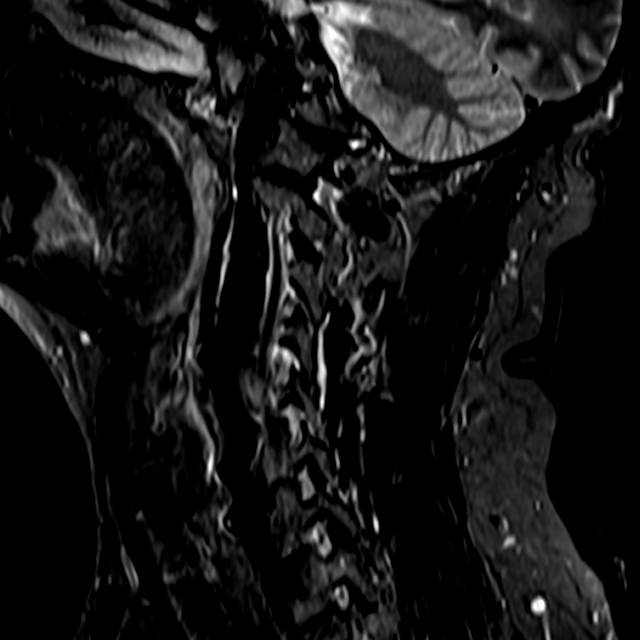
[im 8/13]
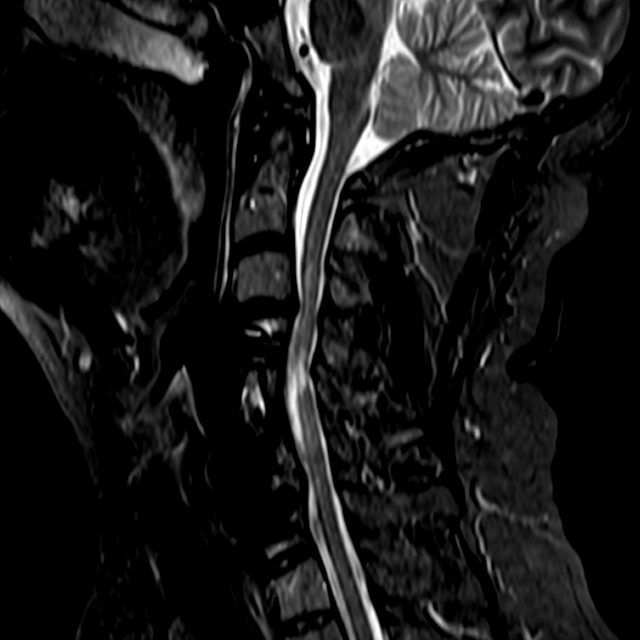
[im 13/13]
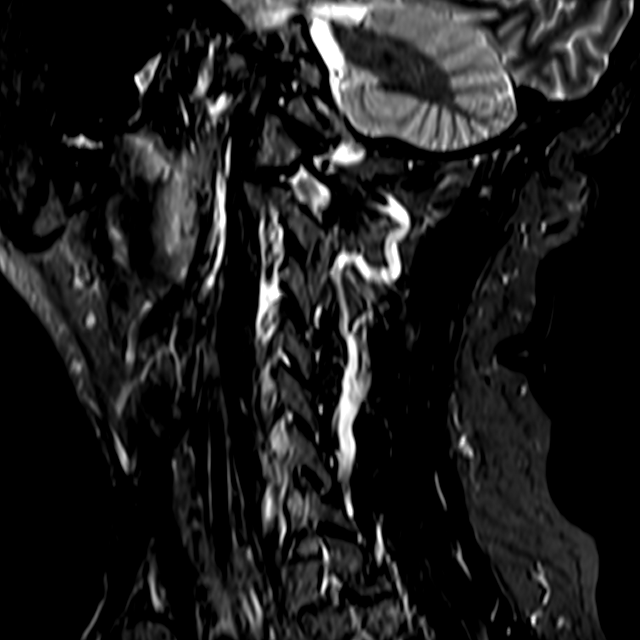

[Series 7: T2 · axial · 3.0mm · 0.18mm/px · z∈[-61,+44]mm · 10 of 36 slices shown (2 of 2)]
[im 3/36]
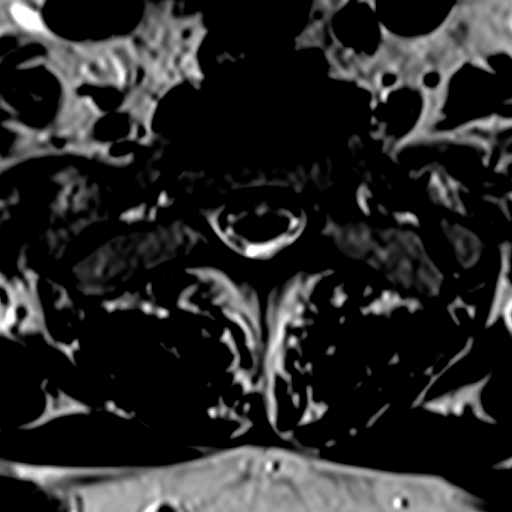
[im 5/36]
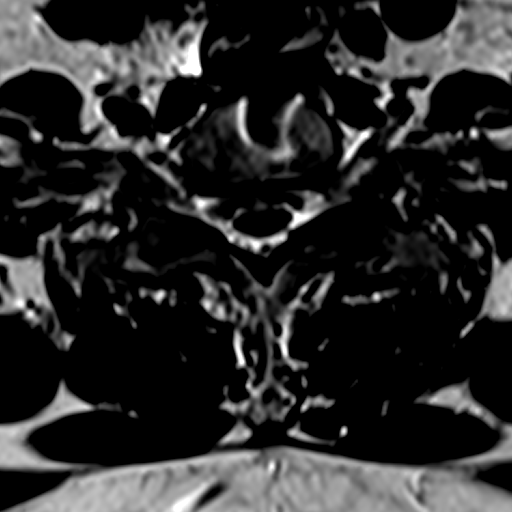
[im 8/36]
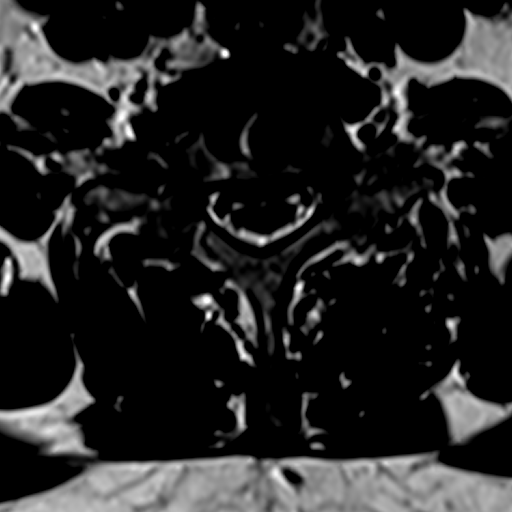
[im 12/36]
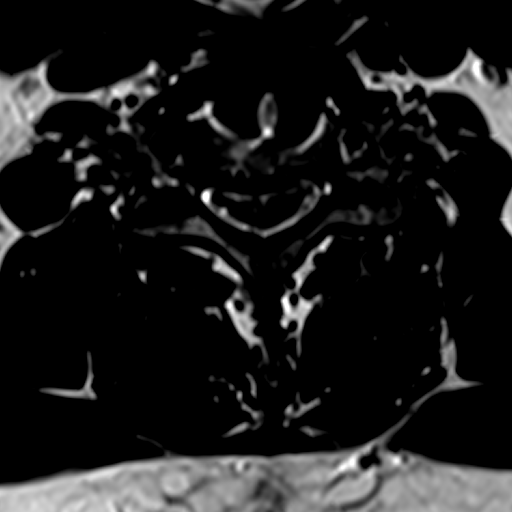
[im 17/36]
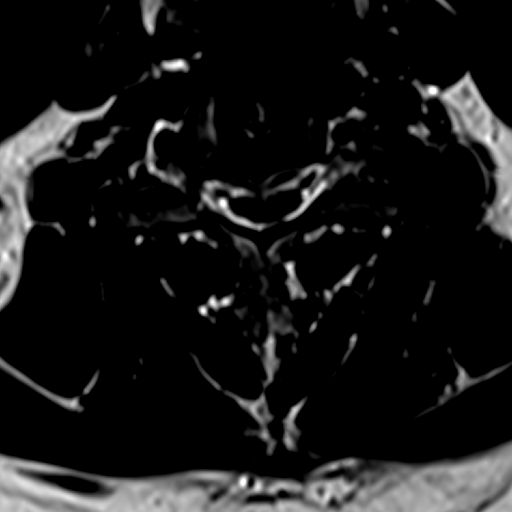
[im 19/36]
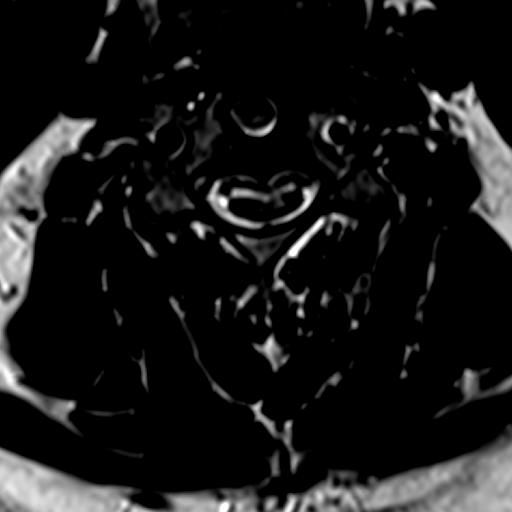
[im 22/36]
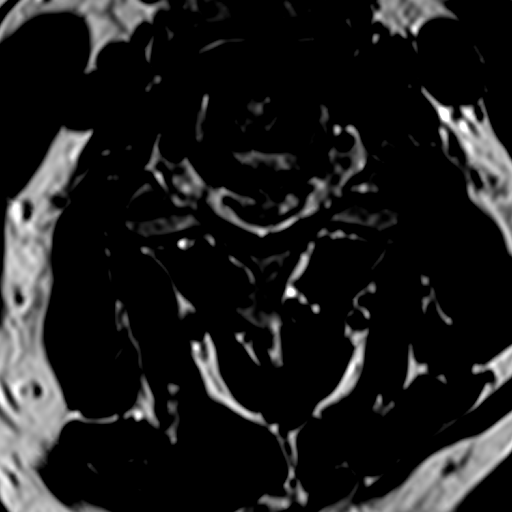
[im 26/36]
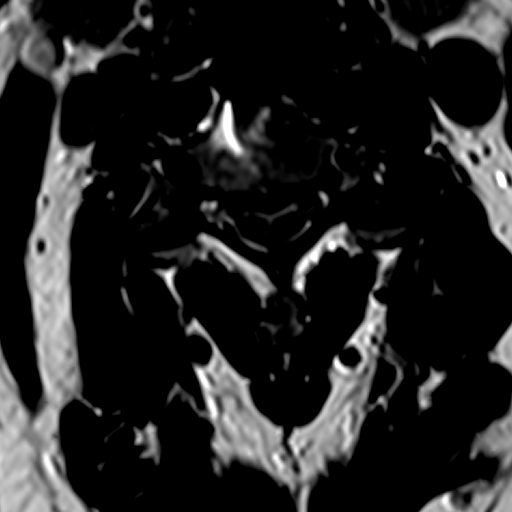
[im 31/36]
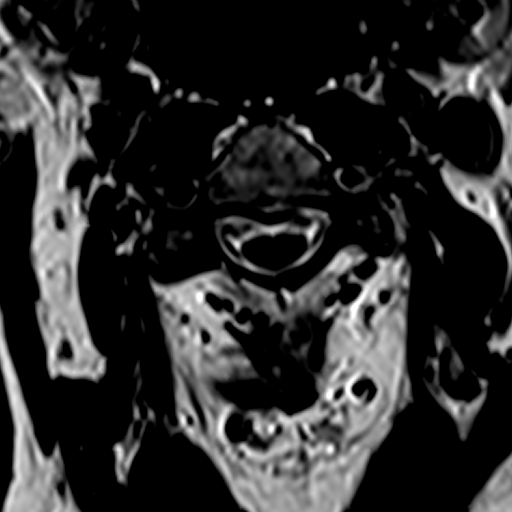
[im 36/36]
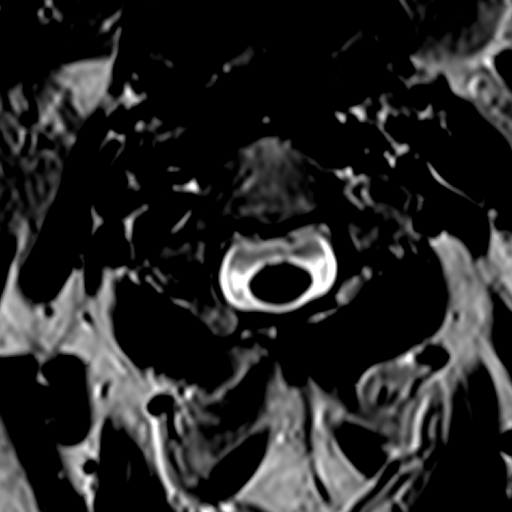

[21 of 48 positions shown; findings below may reference images not displayed]

FINDINGS: Alignment: Straightening of the normal cervical lordosis. No
listhesis or malalignment.

Vertebrae: Susceptibility artifact from prior cervical fusion at C4
through C7 with C5 corpectomy. Vertebral body heights maintained
without acute or interval fracture. Bone marrow signal intensity
within normal limits. No discrete or worrisome osseous lesions. No
abnormal marrow edema.

Cord: Focus of T2 hyperintensity with associated cord atrophy at the
level of C4-5, consistent with chronic myelomalacia. Additional
small focus of chronic myelomalacia noted within the right aspect of
the cord at C6-7 (series 7, image 26). Signal intensity otherwise
normal.

Posterior Fossa, vertebral arteries, paraspinal tissues: Visualized
brain and posterior fossa within normal limits. Craniocervical
junction normal. Paraspinous and prevertebral soft tissues normal.
Normal intravascular flow voids present within the vertebral
arteries bilaterally.

Disc levels:

C2-C3: Unremarkable.

C3-C4: Moderate central disc protrusion indents the ventral thecal
sac, slightly asymmetric to the left. Disc abuts the ventral spinal
cord with mild cord flattening. Resultant moderate spinal stenosis.
Mild uncovertebral hypertrophy without significant foraminal
encroachment. Overall, disc protrusion at this level is similar in
AP diameter but slightly more broad in transverse diameter as
compared to previous exam. Spinal stenosis is relatively similar.

C4-C5: Prior fusion with corpectomy. No residual canal or foraminal
stenosis.

C5-C6: Prior fusion with corpectomy no residual spinal stenosis.
Residual right-sided uncovertebral hypertrophy with resultant mild
to moderate right C6 foraminal narrowing, improved from previous. No
left foraminal encroachment.

C6-C7: Prior fusion. Broad posterior osseous ridging mildly flattens
the ventral thecal sac without significant spinal stenosis.
Bilateral uncovertebral hypertrophy with resultant mild bilateral C7
foraminal narrowing, similar to previous.

C7-T1:  Mild facet hypertrophy.  No canal or foraminal stenosis.

Visualized upper thoracic spine demonstrates no significant finding.
IMPRESSION: 1. Postoperative changes from interval fusion at C4 through C7 with
C5 corpectomy. No significant residual spinal stenosis.
Uncovertebral spurring with residual mild to moderate right C6 and
mild bilateral C7 foraminal stenosis, improved from previous.
2. Adjacent segment disease with central disc protrusion at C3-4
with resultant moderate spinal stenosis.
3. Foci of chronic myelomalacia at C4-5 and C6-7.

## 2018-12-04 ENCOUNTER — Other Ambulatory Visit: Payer: Self-pay | Admitting: Family Medicine

## 2018-12-07 ENCOUNTER — Other Ambulatory Visit: Payer: Self-pay

## 2018-12-07 MED ORDER — LISINOPRIL 5 MG PO TABS: 5.0000 mg | ORAL_TABLET | Freq: Every day | ORAL | 1 refills | Status: DC

## 2019-03-18 ENCOUNTER — Encounter: Payer: BLUE CROSS/BLUE SHIELD | Admitting: Family Medicine

## 2019-03-18 ENCOUNTER — Encounter: Payer: Self-pay | Admitting: Family Medicine

## 2019-03-18 ENCOUNTER — Ambulatory Visit (INDEPENDENT_AMBULATORY_CARE_PROVIDER_SITE_OTHER): Payer: BC Managed Care – PPO | Admitting: Family Medicine

## 2019-03-18 ENCOUNTER — Other Ambulatory Visit: Payer: Self-pay

## 2019-03-18 VITALS — BP 116/62 | HR 66 | Temp 97.7°F | Resp 17 | Ht 64.0 in | Wt 187.5 lb

## 2019-03-18 DIAGNOSIS — E7800 Pure hypercholesterolemia, unspecified: Secondary | ICD-10-CM

## 2019-03-18 DIAGNOSIS — I10 Essential (primary) hypertension: Secondary | ICD-10-CM

## 2019-03-18 DIAGNOSIS — Z0000 Encounter for general adult medical examination without abnormal findings: Secondary | ICD-10-CM | POA: Diagnosis not present

## 2019-03-18 DIAGNOSIS — E669 Obesity, unspecified: Secondary | ICD-10-CM

## 2019-03-18 DIAGNOSIS — E119 Type 2 diabetes mellitus without complications: Secondary | ICD-10-CM

## 2019-03-18 DIAGNOSIS — Z125 Encounter for screening for malignant neoplasm of prostate: Secondary | ICD-10-CM

## 2019-03-18 LAB — CBC WITH DIFFERENTIAL/PLATELET
Basophils Absolute: 0.1 10*3/uL (ref 0.0–0.1)
Basophils Relative: 1.2 % (ref 0.0–3.0)
Eosinophils Absolute: 0.2 10*3/uL (ref 0.0–0.7)
Eosinophils Relative: 1.9 % (ref 0.0–5.0)
HCT: 51.2 % (ref 39.0–52.0)
Hemoglobin: 16.9 g/dL (ref 13.0–17.0)
Lymphocytes Relative: 21 % (ref 12.0–46.0)
Lymphs Abs: 1.7 10*3/uL (ref 0.7–4.0)
MCHC: 33.1 g/dL (ref 30.0–36.0)
MCV: 90.6 fl (ref 78.0–100.0)
Monocytes Absolute: 0.4 10*3/uL (ref 0.1–1.0)
Monocytes Relative: 5.3 % (ref 3.0–12.0)
Neutro Abs: 5.7 10*3/uL (ref 1.4–7.7)
Neutrophils Relative %: 70.6 % (ref 43.0–77.0)
Platelets: 205 10*3/uL (ref 150.0–400.0)
RBC: 5.65 Mil/uL (ref 4.22–5.81)
RDW: 14 % (ref 11.5–15.5)
WBC: 8 10*3/uL (ref 4.0–10.5)

## 2019-03-18 LAB — COMPREHENSIVE METABOLIC PANEL
ALT: 18 U/L (ref 0–53)
AST: 16 U/L (ref 0–37)
Albumin: 4.7 g/dL (ref 3.5–5.2)
Alkaline Phosphatase: 77 U/L (ref 39–117)
BUN: 22 mg/dL (ref 6–23)
CO2: 28 mEq/L (ref 19–32)
Calcium: 9.6 mg/dL (ref 8.4–10.5)
Chloride: 107 mEq/L (ref 96–112)
Creatinine, Ser: 1.03 mg/dL (ref 0.40–1.50)
GFR: 74.04 mL/min (ref >60.00)
Glucose, Bld: 138 mg/dL — ABNORMAL HIGH (ref 70–99)
Potassium: 4.7 mEq/L (ref 3.5–5.1)
Sodium: 143 mEq/L (ref 135–145)
Total Bilirubin: 0.6 mg/dL (ref 0.2–1.2)
Total Protein: 6.8 g/dL (ref 6.0–8.3)

## 2019-03-18 LAB — LIPID PANEL
Cholesterol: 223 mg/dL — ABNORMAL HIGH (ref 0–200)
HDL: 53.8 mg/dL (ref >39.00)
LDL Cholesterol: 139 mg/dL — ABNORMAL HIGH (ref 0–99)
NonHDL: 169.35
Total CHOL/HDL Ratio: 4
Triglycerides: 153 mg/dL — ABNORMAL HIGH (ref 0.0–149.0)
VLDL: 30.6 mg/dL (ref 0.0–40.0)

## 2019-03-18 LAB — TSH: TSH: 1.4 u[IU]/mL (ref 0.35–4.50)

## 2019-03-18 LAB — MICROALBUMIN / CREATININE URINE RATIO
Creatinine,U: 213.4 mg/dL
Microalb Creat Ratio: 0.8 mg/g (ref 0.0–30.0)
Microalb, Ur: 1.6 mg/dL (ref 0.0–1.9)

## 2019-03-18 LAB — HEMOGLOBIN A1C: Hgb A1c MFr Bld: 6.2 % (ref 4.6–6.5)

## 2019-03-18 LAB — PSA: PSA: 1.35 ng/mL (ref 0.10–4.00)

## 2019-03-18 NOTE — Progress Notes (Signed)
Office Note 03/18/2019  CC:  Chief Complaint  Patient presents with  . Annual Exam    pt is fasating.    HPI:  Derrick Willis is a 58 y.o. White male who is here for annual health maintenance exam.  Glucoses have been higher than his usual lately: around 140s-150s fasting.   Feeling a little dizzy sometimes.  Works out in the [employer] a lot. Says his diet is not good lately.  Doesn't hydrate well.  No walking for exercise anymore. Working a lot lately [place] very busy.  HTN: good bp's with home monitoring.  Past Medical History:  Diagnosis Date  . Asthma    "grew out of it", esp after quitting smoking arond age 27 yrs old  . Atypical chest pain 06/12/2014  . Blood in stool   . Carpal tunnel syndrome    bilat, L>>R (Dr. Vertell Limber).  Hand symptoms prompted referral to rheum--lab w/u NEG.  Wrist splinting x 6 wks advised, not much help so low dose prednisone rx'd, then pt had to get steroid injection into L carpal tunnel 12/2016.  Marland Kitchen Cervical myelopathy [facility])    Surgical fusion--Dr Vertell Limber (cerv spinal stenosis).  Stable as of 07/2018 neurosurg f/u.  Marland Kitchen Chronic low back pain   . Diabetes mellitus without complication ([place])    No diab retinop as of 12/2014  . Gout    Knee and toe--allopurinol since approx 2012 and has had no flares since being on this med (1/2 of 300 mg tab qd)  . Hyperkalemia 2015/2016   low K diet; cut lisinopril from 10mg  to 5mg  qd 11/2014.  Marland Kitchen Hyperlipidemia    med x 1 trial=adverse side effects so he stopped it.  LDL 130s 08/2014, then dx'd with DM 2 shortly after, pt refused another trial of statin 05/2017.  Marland Kitchen Hypertension   . Paresthesia of both hands    Dr. Vertell Limber: MRI 01/2018: no acute abnormality.  Stable-to-improved chronic spine/spinal cord changes, fusion good.  . Sleep apnea    has cpap but dosnt use much   . Umbilical hernia 0932    Past Surgical History:  Procedure Laterality Date  . ANTERIOR CERVICAL DECOMP/DISCECTOMY FUSION N/A 03/07/2017    Procedure: Cervical four-five, Cervical five-six, Cerivcal six- seven Anterior cervical decompression/discectomy/fusion WITH CERVICAL FIVE COREPECTOMY;  Surgeon: Erline Levine, MD;  Location: [facility];  Service: Neurosurgery;  Laterality: N/A;  C4-5 C5-6 C6-7 Anterior cervical decompression/discectomy/fusion  . BACK SURGERY  12/05/2016   Lumbar, Dr. Vertell Limber at [facility] center  . COLONOSCOPY W/ POLYPECTOMY  10/13/14   Hyperplastic; recall 10 yrs  . VASECTOMY    . WISDOM TOOTH EXTRACTION      Family History  Problem Relation Age of Onset  . Arthritis Mother   . Arthritis Father   . Hyperlipidemia Father   . Hypertension Father   . Colon polyps Neg Hx   . Esophageal cancer Neg Hx   . Rectal cancer Neg Hx   . Stomach cancer Neg Hx     Social History   Socioeconomic History  . Marital status: Legally Separated    Spouse name: Not on file  . Number of children: Not on file  . Years of education: Not on file  . Highest education level: Not on file  Occupational History  . Not on file  Social Needs  . Financial resource strain: Not on file  . Food insecurity    Worry: Not on file    Inability: Not on file  .  Transportation needs    Medical: Not on file    Non-medical: Not on file  Tobacco Use  . Smoking status: Former Smoker    Packs/day: 1.50    Years: 12.00    Pack years: 18.00    Types: Cigars    Quit date: 05/31/1990    Years since quitting: 28.8  . Smokeless tobacco: Never Used  . Tobacco comment: Quit smoking Cigars 10/2015  Substance and Sexual Activity  . Alcohol use: Yes    Alcohol/week: 1.0 - 2.0 standard drinks    Types: 1 - 2 Glasses of wine per week    Comment: 3 x weekly  . Drug use: No  . Sexual activity: Not on file  Lifestyle  . Physical activity    Days per week: Not on file    Minutes per session: Not on file  . Stress: Not on file  Relationships  . Social Herbalist on phone: Not on file    Gets together: Not on file    Attends  religious service: Not on file    Active member of club or organization: Not on file    Attends meetings of clubs or organizations: Not on file    Relationship status: Not on file  . Intimate partner violence    Fear of current or ex partner: Not on file    Emotionally abused: Not on file    Physically abused: Not on file    Forced sexual activity: Not on file  Other Topics Concern  . Not on file  Social History Narrative   Divorced.  Two children, both young adults (one son plans to go to med school).   Occupation: Grading contractor-also a Psychologist, sport and exercise.   Education: 2 years of college.   Orig from [place], still lives there.   Tob 30 pack-yr hx, quit around age 59.   Social drinker.     No regular exercise.  Eating habits poor.    Outpatient Medications Prior to Visit  Medication Sig Dispense Refill  . allopurinol (ZYLOPRIM) 300 MG tablet TAKE ONE TABLET BY MOUTH ONCE DAILY 30 tablet 12  . aspirin EC 81 MG tablet Take 81 mg by mouth daily.    . baclofen (LIORESAL) 10 MG tablet Take 1 tablet by mouth 3 (three) times daily as needed.  1  . lisinopril (PRINIVIL,ZESTRIL) 5 MG tablet Take 1 tablet by mouth once daily 90 tablet 1  . Polyethyl Glycol-Propyl Glycol (SYSTANE) 0.4-0.3 % SOLN Place 1-2 drops into both eyes 4 (four) times daily as needed (for dry eyes.).    Marland Kitchen lisinopril (PRINIVIL,ZESTRIL) 5 MG tablet Take 1 tablet (5 mg total) by mouth daily. (Patient not taking: Reported on 03/18/2019) 90 tablet 1   No facility-administered medications prior to visit.     Allergies  Allergen Reactions  . Prednisone Other (See Comments)    Headaches, muscle aches, fatigue    ROS Review of Systems  Constitutional: Negative for appetite change, chills, fatigue and fever.  HENT: Negative for congestion, dental problem, ear pain and sore throat.   Eyes: Negative for discharge, redness and visual disturbance.  Respiratory: Negative for cough, chest tightness, shortness of breath and wheezing.    Cardiovascular: Negative for chest pain, palpitations and leg swelling.  Gastrointestinal: Negative for abdominal pain, blood in stool, diarrhea, nausea and vomiting.  Genitourinary: Negative for difficulty urinating, dysuria, flank pain, frequency, hematuria and urgency.  Musculoskeletal: Negative for arthralgias, back pain, joint swelling, myalgias and neck  stiffness.  Skin: Negative for pallor and rash.  Neurological: Positive for light-headedness. Negative for dizziness, speech difficulty, weakness and headaches.  Hematological: Negative for adenopathy. Does not bruise/bleed easily.  Psychiatric/Behavioral: Negative for confusion and sleep disturbance. The patient is not nervous/anxious.     PE; Blood pressure 116/62, pulse 66, temperature 97.7 F (36.5 C), temperature source Temporal, resp. rate 17, height 5\' 4"  (1.626 m), weight 187 lb 8 oz (85 kg), SpO2 97 %. Body mass index is 32.18 kg/m.  Gen: Alert, well appearing.  Patient is oriented to person, place, time, and situation. AFFECT: pleasant, lucid thought and speech. ENT: Ears: EACs clear, normal epithelium.  TMs with good light reflex and landmarks bilaterally.  Eyes: no injection, icteris, swelling, or exudate.  EOMI, PERRLA. Nose: no drainage or turbinate edema/swelling.  No injection or focal lesion.  Mouth: lips without lesion/swelling.  Oral mucosa pink and moist.  Dentition intact and without obvious caries or gingival swelling.  Oropharynx without erythema, exudate, or swelling.  Neck: supple/nontender.  No LAD, mass, or TM.  Carotid pulses 2+ bilaterally, without bruits. CV: RRR, no m/r/g.   LUNGS: CTA bilat, nonlabored resps, good aeration in all lung fields. ABD: soft, NT, ND, BS normal.  No hepatospenomegaly or mass.  No bruits. EXT: no clubbing, cyanosis, or edema.  Musculoskeletal: no joint swelling, erythema, warmth, or tenderness.  ROM of all joints intact. Skin - no sores or suspicious lesions or rashes or color  changes Rectal exam: negative without mass, lesions or tenderness, PROSTATE EXAM: smooth and symmetric without nodules or tenderness.   Pertinent labs:  Lab Results  Component Value Date   TSH 1.35 11/26/2017   Lab Results  Component Value Date   WBC 8.4 11/26/2017   HGB 16.6 11/26/2017   HCT 49.8 11/26/2017   MCV 88.9 11/26/2017   PLT 205.0 11/26/2017   Lab Results  Component Value Date   CREATININE 0.95 09/15/2018   BUN 24 (H) 09/15/2018   NA 142 09/15/2018   K 4.7 09/15/2018   CL 105 09/15/2018   CO2 30 09/15/2018   Lab Results  Component Value Date   ALT 17 11/26/2017   AST 16 11/26/2017   ALKPHOS 59 11/26/2017   BILITOT 0.5 11/26/2017   Lab Results  Component Value Date   CHOL 196 11/26/2017   Lab Results  Component Value Date   HDL 57.70 11/26/2017   Lab Results  Component Value Date   LDLCALC 113 (H) 11/26/2017   Lab Results  Component Value Date   TRIG 125.0 11/26/2017   Lab Results  Component Value Date   CHOLHDL 3 11/26/2017   Lab Results  Component Value Date   PSA 1.14 11/26/2017   PSA 1.04 09/26/2016   PSA 1.08 08/08/2015   Lab Results  Component Value Date   HGBA1C 6.3 09/15/2018    ASSESSMENT AND PLAN:   1) HTN: The current medical regimen is effective;  continue present plan and medications. Lytes/cr today.  2) DM 2, no meds currently. Glucoses higher than his usual lately.  Diet and exercise have fallen off the last 6 mo. HbA1c and urine microalb/cr today. Feet exam normal today.  3) Health maintenance exam: Reviewed age and gender appropriate health maintenance issues (prudent diet, regular exercise, health risks of tobacco and excessive alcohol, use of seatbelts, fire alarms in home, use of sunscreen).  Also reviewed age and gender appropriate health screening as well as vaccine recommendations. Vaccines: all UTD.  Shingrix-->pt declines. Labs:  fasting HP + HbA1c (DM 2). Prostate ca screening: DRE normal today ,  PSA. Colon ca screening: next colonoscopy due 2026.  An After Visit Summary was printed and given to the patient.  FOLLOW UP:  Return in about 6 months (around 09/18/2019) for routine chronic illness f/u.  Signed:  Crissie Sickles, MD           03/18/2019

## 2019-03-18 NOTE — Patient Instructions (Signed)

## 2019-03-19 ENCOUNTER — Other Ambulatory Visit: Payer: Self-pay

## 2019-03-19 DIAGNOSIS — E782 Mixed hyperlipidemia: Secondary | ICD-10-CM

## 2019-03-24 ENCOUNTER — Encounter: Payer: Self-pay | Admitting: Family Medicine

## 2019-06-05 ENCOUNTER — Other Ambulatory Visit: Payer: Self-pay | Admitting: Family Medicine

## 2019-06-13 ENCOUNTER — Other Ambulatory Visit: Payer: Self-pay | Admitting: Family Medicine

## 2019-06-21 ENCOUNTER — Encounter: Payer: Self-pay | Admitting: Family Medicine

## 2019-06-21 ENCOUNTER — Other Ambulatory Visit: Payer: Self-pay

## 2019-06-21 ENCOUNTER — Ambulatory Visit (INDEPENDENT_AMBULATORY_CARE_PROVIDER_SITE_OTHER): Payer: BC Managed Care – PPO | Admitting: Family Medicine

## 2019-06-21 DIAGNOSIS — E782 Mixed hyperlipidemia: Secondary | ICD-10-CM

## 2019-06-21 LAB — LIPID PANEL
Cholesterol: 221 mg/dL — ABNORMAL HIGH (ref 0–200)
HDL: 55.5 mg/dL
LDL Cholesterol: 144 mg/dL — ABNORMAL HIGH (ref 0–99)
NonHDL: 165.31
Total CHOL/HDL Ratio: 4
Triglycerides: 106 mg/dL (ref 0.0–149.0)
VLDL: 21.2 mg/dL (ref 0.0–40.0)

## 2019-08-24 ENCOUNTER — Telehealth: Payer: Self-pay | Admitting: Family Medicine

## 2019-08-24 DIAGNOSIS — G4733 Obstructive sleep apnea (adult) (pediatric): Secondary | ICD-10-CM

## 2019-08-24 NOTE — Telephone Encounter (Signed)
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   He is being told that his sleep apnea machine must be calibrated. They cannot complete the paperwork for his DOT physical until this has been done with the machine. They have given patient deadline of 09/19/19 to complete.  Please advise. Patient can be reached at [phone].  Thank you

## 2019-08-25 NOTE — Telephone Encounter (Signed)
Patient advised of referral.  Please put this as priority since patient has a deadline to meet to continue working.  Thank you!

## 2019-08-25 NOTE — Telephone Encounter (Signed)
Is this something you can do? Please advise

## 2019-08-25 NOTE — Telephone Encounter (Signed)
I can't do it. I'll have to refer him to a sleep specialist in order for him to get anything done regarding his obstuctive sleep apnea and the machine and supplies and calibrating, etc. I'll order referral to [facility] now b/c they seem to be able to get people in quicker.

## 2019-09-06 ENCOUNTER — Encounter: Payer: Self-pay | Admitting: Neurology

## 2019-09-06 ENCOUNTER — Ambulatory Visit: Payer: BC Managed Care – PPO | Admitting: Neurology

## 2019-09-06 ENCOUNTER — Other Ambulatory Visit: Payer: Self-pay

## 2019-09-06 VITALS — BP 154/78 | HR 56 | Temp 97.4°F | Ht 65.0 in | Wt 187.0 lb

## 2019-09-06 DIAGNOSIS — M10471 Other secondary gout, right ankle and foot: Secondary | ICD-10-CM

## 2019-09-06 DIAGNOSIS — G959 Disease of spinal cord, unspecified: Secondary | ICD-10-CM | POA: Diagnosis not present

## 2019-09-06 DIAGNOSIS — R0789 Other chest pain: Secondary | ICD-10-CM | POA: Diagnosis not present

## 2019-09-06 DIAGNOSIS — Z024 Encounter for examination for driving license: Secondary | ICD-10-CM

## 2019-09-06 DIAGNOSIS — M109 Gout, unspecified: Secondary | ICD-10-CM | POA: Insufficient documentation

## 2019-09-06 DIAGNOSIS — E118 Type 2 diabetes mellitus with unspecified complications: Secondary | ICD-10-CM

## 2019-09-06 DIAGNOSIS — G4733 Obstructive sleep apnea (adult) (pediatric): Secondary | ICD-10-CM

## 2019-09-06 DIAGNOSIS — Z9989 Dependence on other enabling machines and devices: Secondary | ICD-10-CM | POA: Insufficient documentation

## 2019-09-06 DIAGNOSIS — E119 Type 2 diabetes mellitus without complications: Secondary | ICD-10-CM

## 2019-09-06 DIAGNOSIS — I10 Essential (primary) hypertension: Secondary | ICD-10-CM

## 2019-09-06 NOTE — Progress Notes (Signed)
SLEEP MEDICINE CLINIC    Provider:  Larey Seat, MD  Primary Care Physician:  Tammi Sou, MD [address] [place] [address]     Referring Provider: Tammi Sou, Md [address] [place],  [place] [address]          Chief Complaint according to patient   Patient presents with:    . New Patient (Initial Visit)           HISTORY OF PRESENT ILLNESS:  Derrick Willis is a 59 year old Caucasian male patient seen upon referral on 09/06/2019 .  Chief concern according to patient :  I have a CPAP from years ago, but it didn't do me any good. I had the study in [place], [place] at [facility].  The machine was mailed to me and I never followed up.   Mr. Colyer had childhood asthma it lasted probably through his 30s as long as he was smoking which she quit around age 12.  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   He has a history of carpal tunnel syndrome surgically treated by Dr. Vertell Limber 2018 cervical myelopathy and surgical fusion December 2019 by Dr. Vertell Limber, lower back degenerative disease.  Gout affecting knee and toe, hyperkalemia, hyperlipidemia, hypertension, in June 2019 Dr. Joaquim Lai evaluated him for paresthesias of both hands.  Is also history of umbilical hernia in Q000111Q.  And diabetes mellitus.   DOT evaluation questioned his lack of CPAP use and asked for re-evaluation. He is also seeing Dr Vertell Limber next week.     Sleep relevant medical history: Nocturia- one, no Tonsillectomy , had cervical spine surgery, but no  deviated septum or sinus surgery, no  UPPP.   he had tubes in his ears at kindergarten age.  Family medical /sleep history:  other family member on CPAP with OSA, insomnia, sleep walkers.    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 and lives in a household alone with his dog.  He is divorced, his wife reportedly was very bothered by his snoring.  He has 2 adult sons, one in DO school.  The patient currently works. Tobacco use; quit at age 69 .   ETOH use - 2-3 drinks a week, Caffeine intake in form of Coffee( part decaffeinated 4-6 cups a day ) Soda( none ) Tea ( once I a while ) or energy drinks. Regular exercise in form of walking     Sleep habits are as follows: The patient's dinner time is between 6-8  PM. The patient goes to bed at 10 PM and continues to sleep for many hours,. The preferred sleep position is sideways, with the support of 2 pillows. Dreams are reportedly frequent.  6 AM is the usual rise time. The patient wakes up spontaneously at 5-6 AM He reports  feeling refreshed or restored in AM, with symptoms such as dry mouth, but no morning headaches, and only mild residual fatigue.  Naps are taken in frequently.   Review of Systems: Out of a complete 14 system review, the patient  complains of only the following symptoms, and all other reviewed systems are negative.:   snoring,   How likely are you to doze in the following situations: 0 = not likely, 1 = slight chance, 2 = moderate chance, 3 = high chance   Sitting and Reading? Watching Television? Sitting inactive in a public place (theater or meeting)? As a passenger in a car for an hour without a break? Lying down in the afternoon when circumstances permit? Sitting and talking to someone? Sitting quietly after lunch without alcohol? In a car, while stopped for a few minutes in traffic?   Total = 6/ 24 / 24 points    Social History   Socioeconomic History  . Marital status: Legally Separated     Spouse name: Not on file  . Number of children: Not on file  . Years of education: Not on file  . Highest education level: Not on file  Occupational History  . Not on file  Tobacco Use  . Smoking status: Former Smoker    Packs/day: 1.50    Years: 12.00    Pack years: 18.00    Types: Cigars    Quit date: 05/31/1990    Years since quitting: 29.2  . Smokeless tobacco: Never Used  . Tobacco comment: Quit smoking Cigars 10/2015  Substance and Sexual Activity  . Alcohol use: Yes    Alcohol/week: 1.0 - 2.0 standard drinks    Types: 1 - 2 Glasses of wine per week    Comment: 2-3 x weekly  . Drug use: No  . Sexual activity: Not on file  Other Topics Concern  . Not on file  Social History Narrative   Divorced.  Two children, both young adults (one son plans to go to med school).   Occupation: Grading contractor-also a Psychologist, sport and exercise.   Education: 2 years of college.   Orig from [place], still lives there.   Tob 30 pack-yr hx, quit around age 59.   Social drinker.     No regular exercise.  Eating habits poor.   Social Determinants of Health   Financial Resource Strain:   . Difficulty of Paying Living Expenses: Not on file  Food Insecurity:   . Worried About Charity fundraiser in the Last Year: Not on file  . Ran Out of Food in the Last Year: Not on file  Transportation Needs:   . Lack of Transportation (Medical): Not on file  . Lack of Transportation (Non-Medical): Not on file  Physical Activity:   . Days of Exercise per Week: Not on file  . Minutes of Exercise per Session: Not on file  Stress:   . Feeling of Stress : Not on file  Social Connections:   . Frequency of Communication with Friends and Family: Not on file  . Frequency of Social Gatherings with Friends and Family: Not on file  . Attends Religious Services: Not on file  . Active Member of Clubs or Organizations: Not on file  . Attends Archivist Meetings: Not on file  . Marital Status: Not on file    Family  History  Problem Relation Age of Onset  . Arthritis Mother   . Arthritis Father   . Hyperlipidemia Father   . Hypertension Father   . Colon polyps Neg Hx   . Esophageal cancer Neg Hx   . Rectal cancer Neg Hx   . Stomach cancer Neg Hx     Past Medical History:  Diagnosis Date  . Asthma    "  grew out of it", esp after quitting smoking arond age 52 yrs old  . Atypical chest pain 06/12/2014  . Blood in stool   . Carpal tunnel syndrome    bilat, L>>R (Dr. Vertell Limber).  Hand symptoms prompted referral to rheum--lab w/u NEG.  Wrist splinting x 6 wks advised, not much help so low dose prednisone rx'd, then pt had to get steroid injection into L carpal tunnel 12/2016.  Marland Kitchen Cervical myelopathy [facility])    Surgical fusion--Dr Vertell Limber (cerv spinal stenosis).  Stable as of 07/2018 neurosurg f/u.  Marland Kitchen Chronic low back pain   . Diabetes mellitus without complication ([place])    No diab retinop as of 12/2014  . Gout    Knee and toe--allopurinol since approx 2012 and has had no flares since being on this med (1/2 of 300 mg tab qd)  . Hyperkalemia 2015/2016   low K diet; cut lisinopril from 10mg  to 5mg  qd 11/2014.  Marland Kitchen Hyperlipidemia    med x 1 trial=adverse side effects so he stopped it.  LDL 130s 08/2014, then dx'd with DM 2 shortly after, pt refused another trial of statin 05/2017 and 2020.  Marland Kitchen Hypertension   . Paresthesia of both hands    Dr. Vertell Limber: MRI 01/2018: no acute abnormality.  Stable-to-improved chronic spine/spinal cord changes, fusion good.  . Sleep apnea    has cpap but dosnt use much   . Umbilical hernia [id number]    Past Surgical History:  Procedure Laterality Date  . ANTERIOR CERVICAL DECOMP/DISCECTOMY FUSION N/A 03/07/2017   Procedure: Cervical four-five, Cervical five-six, Cerivcal six- seven Anterior cervical decompression/discectomy/fusion WITH CERVICAL FIVE COREPECTOMY;  Surgeon: Erline Levine, MD;  Location: [facility];  Service: Neurosurgery;  Laterality: N/A;  C4-5 C5-6 C6-7 Anterior cervical  decompression/discectomy/fusion  . BACK SURGERY  12/05/2016   Lumbar, Dr. Vertell Limber at [facility] surgical center  . COLONOSCOPY W/ POLYPECTOMY  10/13/14   Hyperplastic; recall 10 yrs  . VASECTOMY    . WISDOM TOOTH EXTRACTION       Current Outpatient Medications on File Prior to Visit  Medication Sig Dispense Refill  . allopurinol (ZYLOPRIM) 300 MG tablet Take 1 tablet by mouth once daily 30 tablet 6  . aspirin EC 81 MG tablet Take 81 mg by mouth daily.    . baclofen (LIORESAL) 10 MG tablet Take 1 tablet by mouth 3 (three) times daily as needed.  1  . lisinopril (ZESTRIL) 5 MG tablet Take 1 tablet by mouth once daily 90 tablet 0  . Polyethyl Glycol-Propyl Glycol (SYSTANE) 0.4-0.3 % SOLN Place 1-2 drops into both eyes 4 (four) times daily as needed (for dry eyes.).     No current facility-administered medications on file prior to visit.    Allergies  Allergen Reactions  . Prednisone Other (See Comments)    Headaches, muscle aches, fatigue    Physical exam:  Today's Vitals   09/06/19 1119  BP: (!) 154/78  Pulse: (!) 56  Temp: (!) 97.4 F (36.3 C)  Weight: 187 lb (84.8 kg)  Height: 5\' 5"  (1.651 m)   Body mass index is 31.12 kg/m.   Wt Readings from Last 3 Encounters:  09/06/19 187 lb (84.8 kg)  03/18/19 187 lb 8 oz (85 kg)  09/15/18 190 lb (86.2 kg)     Ht Readings from Last 3 Encounters:  09/06/19 5\' 5"  (1.651 m)  03/18/19 5\' 4"  (1.626 m)  09/15/18 5' 4.5" (1.638 m)      General: The patient is awake, alert  and appears not in acute distress. The patient is well groomed. Head: Normocephalic, atraumatic. Neck is supple. Mallampati 4- small mouth, crowded dentition.   neck circumference:16 inches . Nasal airflow patent.  Retrognathia is  seen.  Dental status: n/a  Cardiovascular:  Regular rate and cardiac rhythm by pulse,  without distended neck veins. Respiratory: Lungs are clear to auscultation.  Skin:  Without evidence of ankle edema, or rash. Trunk: The patient's  posture is erect.   Neurologic exam : The patient is awake and alert, oriented to place and time.   Memory subjective described as intact.  Attention span & concentration ability appears normal.  Speech is fluent,  without  dysarthria, but with dysphonia.  Mood and affect are appropriate.   Cranial nerves: no loss of smell or taste reported  Pupils are equal and briskly reactive to light. Funduscopic exam *deferred.   Extraocular movements in vertical and horizontal planes were intact and without nystagmus. No Diplopia. Visual fields by finger perimetry are intact. Hearing was intact to soft voice and finger rubbing.    Facial sensation intact to fine touch.  Facial motor strength is symmetric and tongue and uvula move midline.  Neck ROM: restricted  rotation, tilt and flexion extension were normal for age and shoulder shrug was symmetrical.    Motor exam:  Symmetric bulk, tone and ROM.   Normal tone without cog wheeling, symmetric grip strength .   Sensory:  Fine touch, pinprick and vibration were tested  and  normal.  Proprioception tested in the upper extremities was normal.   Coordination: Rapid alternating movements in the fingers/hands were of normal speed.  The Finger-to-nose maneuver was intact without evidence of ataxia, dysmetria or tremor.   Gait and station: Patient could rise unassisted from a seated position, walked without assistive device.  Stance is of normal width/ base and the patient turned with 3 steps. Balance is intact.   Toe and heel walk were deferred.  Deep tendon reflexes: in the  upper and lower extremities are symmetric and very brisk , intact.  Babinski response was deferred.      After spending a total time of  45 minutes face to face and including  time for physical and neurologic examination, review of laboratory studies,  personal review of imaging studies, reports and results of other testing and review of referral information / records as far as  provided in visit, I have established the following assessments:  1)  Mr. Maheshwari sleep apnea history is poorly documented, his sleep study took place while uninsured and he had no follow up on his CPAP compliance. This may explain his best enchantment with CPAP in general.  I am not sure he was told his apnea was mild but there is no documented baseline AHI, it is also possible that the CPAP did not cover what he needed in terms of pressure.  I would definitely want this patient to return for an attended sleep study.  There are 2 reasons #1 he is a former smoker, he has had a history of fungal snoring, he has been using CPAP after a presumed diagnosis of obstructive sleep apnea.  He has also several other conditions that can affect his sleep related to his history of spinal stenosis cervical spinal stenosis and joint pain.  He had multiple peripheral neuropathies.  All this would make him likely year of restless patient with PLM's or restless legs.  These conditions cannot be checked in a home sleep test.  He has made efforts to resume a regular sleep habits, and to have more regular mealtimes.  His blood pressure today was elevated but his pulse rate was not.  I will order an attended sleep study with a split-night polysomnography if possible, but the patient would have to be Covid tested 3 days in advance.  It is also possible that he will undergo just a general polysomnography and then has to return if apnea has been be confirmed.     My Plan is to proceed with:  1) attended sleep study, for Sleep apnea and for PLMs. / RLS.    I would like to thank McGowen, Adrian Blackwater, MD [address]  [place] [address] for allowing me to meet with and to take care of this pleasant patient.   In short, ALIAN VANDUYNE is presenting with untreated OSA - DOT referral- , I plan to follow up either personally or through our NP within 2-3  month.   CC: I will share my notes with PCP .  Electronically  signed by: Larey Seat, MD 09/06/2019 11:33 AM  [facility] Board certified by The [employer] of Sleep Medicine and Diplomate of the [employer] of Sleep Medicine. Board certified In Neurology through the [place], Fellow of the [employer] of Neurology. Medical Director of [facility].

## 2019-09-06 NOTE — Telephone Encounter (Signed)
Patient's appointment has been scheduled at [facility] for 09/06/19

## 2019-09-07 ENCOUNTER — Telehealth: Payer: Self-pay

## 2019-09-07 ENCOUNTER — Other Ambulatory Visit: Payer: Self-pay | Admitting: Neurology

## 2019-09-07 DIAGNOSIS — Z024 Encounter for examination for driving license: Secondary | ICD-10-CM

## 2019-09-07 DIAGNOSIS — I10 Essential (primary) hypertension: Secondary | ICD-10-CM

## 2019-09-07 DIAGNOSIS — G4733 Obstructive sleep apnea (adult) (pediatric): Secondary | ICD-10-CM

## 2019-09-07 DIAGNOSIS — E119 Type 2 diabetes mellitus without complications: Secondary | ICD-10-CM

## 2019-09-07 NOTE — Telephone Encounter (Signed)
Order placed for a HST

## 2019-09-07 NOTE — Telephone Encounter (Signed)
BCBS denied in lab sleep study. 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 Need an HST order.

## 2019-09-11 ENCOUNTER — Other Ambulatory Visit: Payer: Self-pay | Admitting: Family Medicine

## 2019-09-15 ENCOUNTER — Ambulatory Visit (INDEPENDENT_AMBULATORY_CARE_PROVIDER_SITE_OTHER): Payer: BC Managed Care – PPO | Admitting: Neurology

## 2019-09-15 DIAGNOSIS — G4733 Obstructive sleep apnea (adult) (pediatric): Secondary | ICD-10-CM

## 2019-09-15 DIAGNOSIS — Z024 Encounter for examination for driving license: Secondary | ICD-10-CM

## 2019-09-15 DIAGNOSIS — I10 Essential (primary) hypertension: Secondary | ICD-10-CM

## 2019-09-15 DIAGNOSIS — E119 Type 2 diabetes mellitus without complications: Secondary | ICD-10-CM

## 2019-09-16 ENCOUNTER — Encounter: Payer: Self-pay | Admitting: Family Medicine

## 2019-09-16 ENCOUNTER — Ambulatory Visit (INDEPENDENT_AMBULATORY_CARE_PROVIDER_SITE_OTHER): Payer: BC Managed Care – PPO | Admitting: Family Medicine

## 2019-09-16 ENCOUNTER — Other Ambulatory Visit: Payer: Self-pay

## 2019-09-16 VITALS — BP 130/85 | HR 64 | Temp 97.8°F | Resp 16 | Ht 65.0 in | Wt 191.4 lb

## 2019-09-16 DIAGNOSIS — E119 Type 2 diabetes mellitus without complications: Secondary | ICD-10-CM

## 2019-09-16 DIAGNOSIS — E7800 Pure hypercholesterolemia, unspecified: Secondary | ICD-10-CM | POA: Diagnosis not present

## 2019-09-16 DIAGNOSIS — I10 Essential (primary) hypertension: Secondary | ICD-10-CM | POA: Diagnosis not present

## 2019-09-16 DIAGNOSIS — G4733 Obstructive sleep apnea (adult) (pediatric): Secondary | ICD-10-CM | POA: Diagnosis not present

## 2019-09-16 HISTORY — PX: OTHER SURGICAL HISTORY: SHX169

## 2019-09-16 LAB — POCT GLYCOSYLATED HEMOGLOBIN (HGB A1C)
HbA1c POC (<> result, manual entry): 5.6 % (ref 4.0–5.6)
HbA1c, POC (controlled diabetic range): 5.6 % (ref 0.0–7.0)
HbA1c, POC (prediabetic range): 5.6 % — AB (ref 5.7–6.4)
Hemoglobin A1C: 5.6 % (ref 4.0–5.6)

## 2019-09-16 NOTE — Progress Notes (Signed)
OFFICE VISIT  09/16/2019   CC:  Chief Complaint  Patient presents with  . Follow-up    RCI, pt is fasting     HPI:    Patient is a 59 y.o. Caucasian male who presents for f/u DM, HTN, HLD. A/P as of last visit: "1) HTN: The current medical regimen is effective;  continue present plan and medications. Lytes/cr today.  2) DM 2, no meds currently. Glucoses higher than his usual lately.  Diet and exercise have fallen off the last 6 mo. HbA1c and urine microalb/cr today. Feet exam normal today."  His cholesterol was up last visit but he has repeatedly chosen not to get on cholesterol med.  Interim hx:  HTN: home bps 130 over low 80s consistently. 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  DM: no home glucoses but says he's been focusing on better diab type diet. No exercise through 2020, started regular walking again lately.  He has gotten another sleep study (yesterday)--via Dr. Kavin Leech.  Has dx of OSA and is not on CPAP. This could be contributing to his mildly uncontrolled bp.  He was on CPAP in remote past and didn't recall feeling any better on it so he stopped it.  HLD: declines rx meds.  Recently started herbal called cholestoff about 1 mo ago.  ROS: no fevers, no CP, no SOB, no wheezing, no cough, no dizziness, no HAs, no rashes, no melena/hematochezia.  No polyuria or polydipsia.  No myalgias or arthralgias.   Past Medical History:  Diagnosis Date  . Asthma    "grew out of it", esp after quitting smoking arond age 70 yrs old  . Atypical chest pain 06/12/2014  . Blood in stool   . Carpal tunnel syndrome    bilat, L>>R (Dr. Vertell Limber).  Hand symptoms prompted referral to rheum--lab w/u NEG.  Wrist splinting x 6 wks advised, not much help so low dose prednisone rx'd, then pt had to get steroid injection into L carpal tunnel 12/2016.  Marland Kitchen Cervical myelopathy [facility])    Surgical fusion--Dr Vertell Limber (cerv spinal stenosis).  Stable as of 07/2018 neurosurg f/u.  Marland Kitchen Chronic  low back pain   . Diabetes mellitus without complication ([place])    No diab retinop as of 12/2014  . Gout    Knee and toe--allopurinol since approx 2012 and has had no flares since being on this med (1/2 of 300 mg tab qd)  . Hyperkalemia 2015/2016   low K diet; cut lisinopril from 10mg  to 5mg  qd 11/2014.  Marland Kitchen Hyperlipidemia    med x 1 trial=adverse side effects so he stopped it.  LDL 130s 08/2014, then dx'd with DM 2 shortly after, pt refused another trial of statin 05/2017 and 2020.  Marland Kitchen Hypertension   . Paresthesia of both hands    Dr. Vertell Limber: MRI 01/2018: no acute abnormality.  Stable-to-improved chronic spine/spinal cord changes, fusion good.  . Sleep apnea    has cpap but dosnt use much   . Umbilical hernia [id number]    Past Surgical History:  Procedure Laterality Date  . ANTERIOR CERVICAL DECOMP/DISCECTOMY FUSION N/A 03/07/2017   Procedure: Cervical four-five, Cervical five-six, Cerivcal six- seven Anterior cervical decompression/discectomy/fusion WITH CERVICAL FIVE COREPECTOMY;  Surgeon: Erline Levine, MD;  Location: [facility];  Service: Neurosurgery;  Laterality: N/A;  C4-5 C5-6 C6-7 Anterior cervical decompression/discectomy/fusion  . BACK SURGERY  12/05/2016   Lumbar, Dr. Vertell Limber at [facility] surgical center  . COLONOSCOPY W/ POLYPECTOMY  10/13/14  Hyperplastic; recall 10 yrs  . VASECTOMY    . WISDOM TOOTH EXTRACTION      Outpatient Medications Prior to Visit  Medication Sig Dispense Refill  . allopurinol (ZYLOPRIM) 300 MG tablet Take 1 tablet by mouth once daily 30 tablet 6  . aspirin EC 81 MG tablet Take 81 mg by mouth daily.    . baclofen (LIORESAL) 10 MG tablet Take 1 tablet by mouth 3 (three) times daily as needed.  1  . lisinopril (ZESTRIL) 5 MG tablet Take 1 tablet by mouth once daily 30 tablet 0  . Plant Sterols and Stanols (CHOLESTOFF PO) Take by mouth daily.    Vladimir Faster Glycol-Propyl Glycol (SYSTANE) 0.4-0.3 % SOLN Place 1-2 drops into both eyes 4 (four) times daily as needed  (for dry eyes.).     No facility-administered medications prior to visit.    Allergies  Allergen Reactions  . Prednisone Other (See Comments)    Headaches, muscle aches, fatigue    ROS As per HPI  PE: Blood pressure 130/85, pulse 64, temperature 97.8 F (36.6 C), temperature source Temporal, resp. rate 16, height 5\' 5"  (1.651 m), weight 191 lb 6.4 oz (86.8 kg), SpO2 97 %. Body mass index is 31.85 kg/m.  Gen: Alert, well appearing.  Patient is oriented to person, place, time, and situation. AFFECT: pleasant, lucid thought and speech. CV: RRR, no m/r/g.   LUNGS: CTA bilat, nonlabored resps, good aeration in all lung fields. EXT: no clubbing or cyanosis.  no edema.    LABS:  Lab Results  Component Value Date   TSH 1.40 03/18/2019   Lab Results  Component Value Date   WBC 8.0 03/18/2019   HGB 16.9 03/18/2019   HCT 51.2 03/18/2019   MCV 90.6 03/18/2019   PLT 205.0 03/18/2019   Lab Results  Component Value Date   CREATININE 1.03 03/18/2019   BUN 22 03/18/2019   NA 143 03/18/2019   K 4.7 03/18/2019   CL 107 03/18/2019   CO2 28 03/18/2019   Lab Results  Component Value Date   ALT 18 03/18/2019   AST 16 03/18/2019   ALKPHOS 77 03/18/2019   BILITOT 0.6 03/18/2019   Lab Results  Component Value Date   CHOL 221 (H) 06/21/2019   Lab Results  Component Value Date   HDL 55.50 06/21/2019   Lab Results  Component Value Date   LDLCALC 144 (H) 06/21/2019   Lab Results  Component Value Date   TRIG 106.0 06/21/2019   Lab Results  Component Value Date   CHOLHDL 4 06/21/2019   Lab Results  Component Value Date   PSA 1.35 03/18/2019   PSA 1.14 11/26/2017   PSA 1.04 09/26/2016   Lab Results  Component Value Date   HGBA1C 5.6 09/16/2019   HGBA1C 5.6 09/16/2019   HGBA1C 5.6 (A) 09/16/2019   HGBA1C 5.6 09/16/2019   POC HbA1c today: 5.6%.  IMPRESSION AND PLAN:  1) DM 2: much improved control. Continue improving diet and exercise. No meds.  2) HTN:  control is borderline.  He declined to increase med today, wants to see if still has OSA based on sleep study yesterday, and if he gets on CPAP and bp's don't improve then we'll increase med. Lytes/cr good 02/2019.  Repeat next f/u 6 mo.  3) HLD: he repeatedly declines rx chol med. He's taking herbal called cholestoff. Continue TLC. Plan recheck lipids and hepatic panel next f/u 6 mo.  4) OSA: off CPAP for years. Got  sleep study again yesterday--awaiting results. Suspect he'll get back on CPAP.   An After Visit Summary was printed and given to the patient.  FOLLOW UP: Return in about 6 months (around 03/15/2020) for routine chronic illness f/u.  Signed:  Crissie Sickles, MD           09/16/2019

## 2019-09-20 DIAGNOSIS — G959 Disease of spinal cord, unspecified: Secondary | ICD-10-CM | POA: Diagnosis not present

## 2019-09-20 DIAGNOSIS — I10 Essential (primary) hypertension: Secondary | ICD-10-CM | POA: Diagnosis not present

## 2019-09-20 DIAGNOSIS — M4802 Spinal stenosis, cervical region: Secondary | ICD-10-CM | POA: Diagnosis not present

## 2019-09-20 DIAGNOSIS — M542 Cervicalgia: Secondary | ICD-10-CM | POA: Diagnosis not present

## 2019-09-22 NOTE — Progress Notes (Signed)
Severe obstructive Sleep apnea was diagnosed at an AHI of 31.5/h and REM sleep AHI of 52.3/h. Supine AHI was 39.6/h.  There were 125 oxygen desaturation events, all brief.  Mean heart rate was 49 bpm.   Recommendations:     Severe and REM sleep dependent OSA - the best treatment for this form of sleep apnea is positive airway pressure therapy, CPAP can be used as an autotitration device with mask of choice, heated humidity and setting of 6-18 cm water, 2 cm EPR.  PS: Please note that RLS ( Restless Leg Syndrome) cannot be evaluated in a HST. This would require an attended sleep study.   Interpreting Physician: Larey Seat, MD

## 2019-09-22 NOTE — Procedures (Addendum)
  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 Vertell Limber 2018, cervical myelopathy and surgical fusion December 2019 by Dr. Vertell Limber, lower back degenerative disease. Gout affecting knee and toe, hyperkalemia, hyperlipidemia, hypertension, in June 2019, paresthesia's of both hands; Repair of an umbilical hernia in [id number] and diabetes mellitus.  Summary & Diagnosis:    Severe obstructive Sleep apnea was diagnosed at an AHI of 31.5/h and REM sleep AHI of 52.3/h. Supine AHI was 39.6/h.  There were 125 oxygen desaturation events, all brief.  Mean heart rate was 49 bpm.   Recommendations:      Severe and REM sleep dependent OSA - the best treatment for this form of sleep apnea is positive airway pressure therapy, CPAP can be used as an autotitration device with mask of choice, heated humidity and setting of 6-18 cm water, 2 cm EPR.  PS: Please note that RLS ( Restless Leg Syndrome) cannot be evaluated in a HST. This would require an attended sleep study.   Interpreting Physician: Larey Seat, MD              Sleep Summary  Oxygen Saturation Statistics   Start Study Time: End Study Time: Total Recording Time:  9:29:08 PM 6:09:14 AM 8 h, 40 min  Total Sleep Time % REM of Sleep Time:  7 h, 46 min  21.1    Mean: 94 Minimum: 79 Maximum: 100  Mean of Desaturations Nadirs (%):   91  Oxygen Desaturation. %:   4-9 10-20 >20 Total  Events Number Total   125  11 91.9 8.1  0 0.0  136 100.0   Oxygen Saturation: <90 <=88 <85 <80 <70  Duration (minutes): Sleep % 4.7 1.0  3.1 1.1  0.7 0.2 0.1 0.0 0.0 0.0     Respiratory Indices      Total Events REM NREM All Night  pRDI:  275  pAHI:  238 ODI:  136  pAHIc:  8  %  CSR: 0.0 53.5 52.3 35.3 3.2 31.9 26.0 13.4 0.5 36.4 31.5 18.0 1.1       Pulse Rate Statistics during Sleep (BPM)      Mean:  49 Minimum: N/A Maximum: 89    Indices are calculated using technically valid sleep time of 7 hrs, 33 min. pRDI/pAHI are calculated using 02 desaturations ? 3%  Body Position Statistics  Position Supine Prone Right Left Non-Supine  Sleep (min) 309.1 0.5 61.0 94.0 155.5  Sleep % 66.2 0.1 13.1 20.1 33.3  pRDI 43.3 N/A 15.8 27.6 22.9  pAHI 39.6 N/A 5.9 21.8 15.5  ODI 23.9 N/A 1.0 10.9 7.0     Snoring Statistics Snoring Level (dB) >40 >50 >60 >70 >80 >Threshold (45)  Sleep (min) 254.2 40.3 0.8 0.0 0.0 103.8  Sleep % 54.5 8.6 0.2 0.0 0.0 22.2    Mean: 43 dB Sleep Stages Chart

## 2019-09-22 NOTE — Addendum Note (Signed)
Addended by: Larey Seat on: 09/22/2019 05:56 PM   Modules accepted: Orders

## 2019-09-23 ENCOUNTER — Telehealth: Payer: Self-pay | Admitting: Neurology

## 2019-09-23 ENCOUNTER — Encounter: Payer: Self-pay | Admitting: Family Medicine

## 2019-09-23 ENCOUNTER — Encounter: Payer: Self-pay | Admitting: Neurology

## 2019-09-23 NOTE — Telephone Encounter (Signed)
-----   Message from Larey Seat, MD sent at 09/22/2019  5:56 PM EST ----- Severe obstructive Sleep apnea was diagnosed at an AHI of 31.5/h and REM sleep AHI of 52.3/h. Supine AHI was 39.6/h.  There were 125 oxygen desaturation events, all brief.  Mean heart rate was 49 bpm.   Recommendations:     Severe and REM sleep dependent OSA - the best treatment for this form of sleep apnea is positive airway pressure therapy, CPAP can be used as an autotitration device with mask of choice, heated humidity and setting of 6-18 cm water, 2 cm EPR.  PS: Please note that RLS ( Restless Leg Syndrome) cannot be evaluated in a HST. This would require an attended sleep study.   Interpreting Physician: Larey Seat, MD

## 2019-09-23 NOTE — Telephone Encounter (Signed)
I called pt. I advised pt that Dr. Brett Fairy reviewed their sleep study results and found that pt has severe sleep apnea. Dr. Brett Fairy recommends that pt starts auto CPAP. I reviewed PAP compliance expectations with the pt. Pt is agreeable to starting a CPAP. I advised pt that an order will be sent to a DME, [facility], and [facility] will call the pt within about one week after they file with the pt's insurance. [facility] will show the pt how to use the machine, fit for masks, and troubleshoot the CPAP if needed. A follow up appt was made for insurance purposes with Debbora Presto, NP on March 9,2021 at 8:30 am. Pt verbalized understanding to arrive 15 minutes early and bring their CPAP. A letter with all of this information in it will be mailed to the pt as a reminder. I verified with the pt that the address we have on file is correct. Pt verbalized understanding of results. Pt had no questions at this time but was encouraged to call back if questions arise. I have sent the order to [facility] and have received confirmation that they have received the order.  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 Advised the patient that I would make sure to inform the DME company of this information when I send the order. Pt verbalized understanding.

## 2019-09-29 DIAGNOSIS — G4733 Obstructive sleep apnea (adult) (pediatric): Secondary | ICD-10-CM | POA: Diagnosis not present

## 2019-10-04 ENCOUNTER — Telehealth: Payer: Self-pay

## 2019-10-04 ENCOUNTER — Encounter: Payer: Self-pay | Admitting: Family Medicine

## 2019-10-04 MED ORDER — IRBESARTAN 75 MG PO TABS: 75.0000 mg | ORAL_TABLET | Freq: Every day | ORAL | 3 refills | Status: DC

## 2019-10-04 NOTE — Telephone Encounter (Signed)
OK, stop lisinopril. Pls eRx irbesartan to take instead: irbesartan 75mg , 1 tab po qd, #30, RF x 3. Monitor bp daily for 7-10d and call if consistently >140/90.-thx

## 2019-10-04 NOTE — Telephone Encounter (Signed)
Patient is having side effects from lisinopril (ZESTRIL) 5 MG tablet scratchy, tickling in throat with a dull headaches. Patient would like to switch to [facility].

## 2019-10-04 NOTE — Telephone Encounter (Signed)
Pt thought this was allergy related but still takes allergy medication. Please advise, thanks.

## 2019-10-04 NOTE — Telephone Encounter (Signed)
Patient advised of new medication recommendations.  RX sent and patient will check BP for 7-10 days and call if consistently stays about 140/90.

## 2019-10-12 ENCOUNTER — Encounter: Payer: Self-pay | Admitting: Family Medicine

## 2019-10-20 ENCOUNTER — Encounter: Payer: Self-pay | Admitting: Family Medicine

## 2019-11-02 ENCOUNTER — Ambulatory Visit: Payer: BC Managed Care – PPO | Admitting: Family Medicine

## 2019-11-02 ENCOUNTER — Other Ambulatory Visit: Payer: Self-pay

## 2019-11-02 ENCOUNTER — Encounter: Payer: Self-pay | Admitting: Family Medicine

## 2019-11-02 VITALS — BP 130/75 | HR 59 | Temp 97.8°F | Ht 65.0 in | Wt 195.5 lb

## 2019-11-02 DIAGNOSIS — G4733 Obstructive sleep apnea (adult) (pediatric): Secondary | ICD-10-CM

## 2019-11-02 DIAGNOSIS — Z9989 Dependence on other enabling machines and devices: Secondary | ICD-10-CM | POA: Diagnosis not present

## 2019-11-02 NOTE — Patient Instructions (Signed)
Please continue using your CPAP regularly. While your insurance requires that you use CPAP at least 4 hours each night on 70% of the nights, I recommend, that you not skip any nights and use it throughout the night if you can. Getting used to CPAP and staying with the treatment long term does take time and patience and discipline. Untreated obstructive sleep apnea when it is moderate to severe can have an adverse impact on cardiovascular health and raise her risk for heart disease, arrhythmias, hypertension, congestive heart failure, stroke and diabetes. Untreated obstructive sleep apnea causes sleep disruption, nonrestorative sleep, and sleep deprivation. This can have an impact on your day to day functioning and cause daytime sleepiness and impairment of cognitive function, memory loss, mood disturbance, and problems focussing. Using CPAP regularly can improve these symptoms.  Follow up in 6 months, sooner if needed   Sleep Apnea Sleep apnea affects breathing during sleep. It causes breathing to stop for a short time or to become shallow. It can also increase the risk of:  Heart attack.  Stroke.  Being very overweight (obese).  Diabetes.  Heart failure.  Irregular heartbeat. The goal of treatment is to help you breathe normally again. What are the causes? There are three kinds of sleep apnea:  Obstructive sleep apnea. This is caused by a blocked or collapsed airway.  Central sleep apnea. This happens when the brain does not send the right signals to the muscles that control breathing.  Mixed sleep apnea. This is a combination of obstructive and central sleep apnea. The most common cause of this condition is a collapsed or blocked airway. This can happen if:  Your throat muscles are too relaxed.  Your tongue and tonsils are too large.  You are overweight.  Your airway is too small. What increases the risk?  Being overweight.  Smoking.  Having a small airway.  Being  older.  Being male.  Drinking alcohol.  Taking medicines to calm yourself (sedatives or tranquilizers).  Having family members with the condition. What are the signs or symptoms?  Trouble staying asleep.  Being sleepy or tired during the day.  Getting angry a lot.  Loud snoring.  Headaches in the morning.  Not being able to focus your mind (concentrate).  Forgetting things.  Less interest in sex.  Mood swings.  Personality changes.  Feelings of sadness (depression).  Waking up a lot during the night to pee (urinate).  Dry mouth.  Sore throat. How is this diagnosed?  Your medical history.  A physical exam.  A test that is done when you are sleeping (sleep study). The test is most often done in a sleep lab but may also be done at home. How is this treated?   Sleeping on your side.  Using a medicine to get rid of mucus in your nose (decongestant).  Avoiding the use of alcohol, medicines to help you relax, or certain pain medicines (narcotics).  Losing weight, if needed.  Changing your diet.  Not smoking.  Using a machine to open your airway while you sleep, such as: ? An oral appliance. This is a mouthpiece that shifts your lower jaw forward. ? A CPAP device. This device blows air through a mask when you breathe out (exhale). ? An EPAP device. This has valves that you put in each nostril. ? A BPAP device. This device blows air through a mask when you breathe in (inhale) and breathe out.  Having surgery if other treatments do not work.  It is important to get treatment for sleep apnea. Without treatment, it can lead to:  High blood pressure.  Coronary artery disease.  In men, not being able to have an erection (impotence).  Reduced thinking ability. Follow these instructions at home: Lifestyle  Make changes that your doctor recommends.  Eat a healthy diet.  Lose weight if needed.  Avoid alcohol, medicines to help you relax, and some pain  medicines.  Do not use any products that contain nicotine or tobacco, such as cigarettes, e-cigarettes, and chewing tobacco. If you need help quitting, ask your doctor. General instructions  Take over-the-counter and prescription medicines only as told by your doctor.  If you were given a machine to use while you sleep, use it only as told by your doctor.  If you are having surgery, make sure to tell your doctor you have sleep apnea. You may need to bring your device with you.  Keep all follow-up visits as told by your doctor. This is important. Contact a doctor if:  The machine that you were given to use during sleep bothers you or does not seem to be working.  You do not get better.  You get worse. Get help right away if:  Your chest hurts.  You have trouble breathing in enough air.  You have an uncomfortable feeling in your back, arms, or stomach.  You have trouble talking.  One side of your body feels weak.  A part of your face is hanging down. These symptoms may be an emergency. Do not wait to see if the symptoms will go away. Get medical help right away. Call your local emergency services (911 in the [place] Do not drive yourself to the hospital. Summary  This condition affects breathing during sleep.  The most common cause is a collapsed or blocked airway.  The goal of treatment is to help you breathe normally while you sleep. This information is not intended to replace advice given to you by your health care provider. Make sure you discuss any questions you have with your health care provider. Document Revised: 05/29/2018 Document Reviewed: 04/07/2018 Elsevier Patient Education  [place].

## 2019-11-02 NOTE — Progress Notes (Signed)
PATIENT: Derrick Willis DOB: 1960-09-22  REASON FOR VISIT: follow up HISTORY FROM: patient  Chief Complaint  Patient presents with  . Follow-up    RM16. inital CPAP.  CPAP works well, no questions nor concerns.     HISTORY OF PRESENT ILLNESS: Today 11/02/19 Derrick Willis is a 59 y.o. male here today for follow up OSA recently started on CPAP therapy. HST revealed total AHI of 31.5/hr and REM sleep AHI of 52.3/hr. No prolonged hypoxemia noted.  Derrick Willis reports that he is doing very well on CPAP therapy.  He notes significant improvement in sleep quality and feels less fatigued throughout the day.  He is now sleeping through the night without waking to use the restroom.  He denies any difficulty with his machine.  Compliance report dated 10/02/2019 through 10/31/2019 reveals that he is used CPAP 30 of the last 30 days for compliance of 100%.  He used CPAP greater than 4 hours all 30 days for compliance of 100%.  Average usage was 6 hours and 35 minutes.  Residual AHI was 1.2 on 6 to 18 cm of water and an EPR of 2.  There was no significant leak noted.  HISTORY: (copied from  note on 09/06/2019)  Derrick Willis a 59 year old Caucasian male patientseen upon referralon 09/06/2019 . Chiefconcernaccording to patient : I have a CPAP from years ago, but it didn't do me any good. I had the study in [place], [place] at [facility].  The machine was mailed to me and I never followed up.   Derrick Willis had childhood asthma it lasted probably through his 4s as long as he was smoking which she quit around age 73.  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   He has a history of carpal tunnel syndrome surgically treated by Dr. Vertell Limber 2018 cervical myelopathy and surgical fusion December 2019 by Dr. Vertell Limber, lower back degenerative disease.  Gout affecting knee and toe, hyperkalemia, hyperlipidemia, hypertension, in June 2019 Dr. Joaquim Lai evaluated him for paresthesias of both hands.  Is also history of umbilical hernia in Q000111Q.  And diabetes mellitus.  DOT evaluation questioned his lack of CPAP use and asked for re-evaluation. He is also seeing Dr Vertell Limber next week.   Sleeprelevant medical history: Nocturia- one, no Tonsillectomy , had cervical spine surgery, but no  deviated septum or sinus surgery, no UPPP.  he had tubes in his ears at kindergarten age.  Familymedical /sleep history: other family member on CPAP with OSA, insomnia, sleep walkers.  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 and lives in a household alone with his dog.  He is divorced, his wife reportedly was very bothered by his snoring.  He has 2 adult sons, one in DO school.  The patient currently works. Tobacco use; quit at age 89 .  ETOH use - 2-3 drinks a week, Caffeine intake in form of Coffee( part decaffeinated 4-6 cups a day ) Soda( none ) Tea ( once I a while ) or energy drinks. Regular exercise in form of walking    Sleep habits are as follows:The  patient's dinner time is between 6-8  PM. The patient goes to bed at 10 PM and continues to sleep for many hours,. The preferred sleep position is sideways, with the support of 2 pillows. Dreams are reportedly frequent.  6 AM is the usual rise time. The patient wakes up spontaneously at 5-6 AM He reports  feeling refreshed or restored in AM, with symptoms such as dry mouth, but no morning headaches, and only mild residual fatigue.  Naps are taken in frequently   REVIEW OF SYSTEMS: Out of a complete 14 system review  of symptoms, the patient complains only of the following symptoms, none and all other reviewed systems are negative.  Epworth Sleepiness Scale: 7  ALLERGIES: Allergies  Allergen Reactions  . Prednisone Other (See Comments)    Headaches, muscle aches, fatigue  . Lisinopril Cough    HOME MEDICATIONS: Outpatient Medications Prior to Visit  Medication Sig Dispense Refill  . allopurinol (ZYLOPRIM) 300 MG tablet Take 1 tablet by mouth once daily 30 tablet 6  . aspirin EC 81 MG tablet Take 81 mg by mouth daily.    . baclofen (LIORESAL) 10 MG tablet Take 1 tablet by mouth 3 (three) times daily as needed.  1  . irbesartan (AVAPRO) 75 MG tablet Take 1 tablet (75 mg total) by mouth daily. 30 tablet 3  . Plant Sterols and Stanols (CHOLESTOFF PO) Take by mouth daily.    Derrick Willis Glycol-Propyl Glycol (SYSTANE) 0.4-0.3 % SOLN Place 1-2 drops into both eyes 4 (four) times daily as needed (for dry eyes.).     No facility-administered medications prior to visit.    PAST MEDICAL HISTORY: Past Medical History:  Diagnosis Date  . Asthma    "grew out of it", esp after quitting smoking arond age 55 yrs old  . Atypical chest pain 06/12/2014  . Blood in stool   . Carpal tunnel syndrome    bilat, L>>R (Dr. Vertell Limber).  Hand symptoms prompted referral to rheum--lab w/u NEG.  Wrist splinting x 6 wks advised, not much help so low dose prednisone rx'd, then pt had to get steroid injection into L carpal tunnel 12/2016.  Marland Kitchen Cervical myelopathy [facility])    Surgical fusion--Dr Vertell Limber (cerv spinal stenosis).  Stable as of 08/2019 neurosurg f/u.  Marland Kitchen Chronic low back pain   . Diabetes mellitus without complication ([place])    No diab retinop as of 12/2014  . Gout    Knee and toe--allopurinol since approx 2012 and has had no flares since being on this med (1/2 of 300 mg tab qd)  . Hyperkalemia 2015/2016   low K diet; cut lisinopril from 10mg  to 5mg  qd 11/2014.  Marland Kitchen Hyperlipidemia    med x 1 trial=adverse side effects so he  stopped it.  LDL 130s 08/2014, then dx'd with DM 2 shortly after, pt refused another trial of statin 05/2017 and 2020.  Marland Kitchen Hypertension   . OSA (obstructive sleep apnea)    Remote past: had cpap but didn't use much .  REPEAT HOME SLEEP STUDY 09/16/19-> severe OSA, CPAP recommended.  . Paresthesia of both hands    Dr. Vertell Limber: MRI 01/2018: no acute abnormality.  Stable-to-improved chronic spine/spinal cord changes, fusion good.  Marland Kitchen Umbilical hernia [id number]    PAST SURGICAL HISTORY: Past Surgical History:  Procedure Laterality Date  . ANTERIOR CERVICAL DECOMP/DISCECTOMY FUSION N/A 03/07/2017   Procedure: Cervical four-five, Cervical five-six, Cerivcal six- seven Anterior cervical decompression/discectomy/fusion WITH CERVICAL FIVE COREPECTOMY;  Surgeon: Erline Levine, MD;  Location: [place] [facility];  Service: Neurosurgery;  Laterality: N/A;  C4-5 C5-6 C6-7 Anterior cervical decompression/discectomy/fusion  . BACK SURGERY  12/05/2016   Lumbar, Dr. Vertell Limber at [facility] center  . COLONOSCOPY W/ POLYPECTOMY  10/13/14   Hyperplastic; recall 10 yrs  . Sleep study  09/16/2019   Home sleep study--severe OSA->CPAP recommended  . VASECTOMY    . WISDOM TOOTH EXTRACTION      FAMILY HISTORY: Family History  Problem Relation Age of Onset  . Arthritis Mother   . Arthritis Father   . Hyperlipidemia Father   . Hypertension Father   . Colon polyps Neg Hx   . Esophageal cancer Neg Hx   . Rectal cancer Neg Hx   . Stomach cancer Neg Hx     SOCIAL HISTORY: Social History   Socioeconomic History  . Marital status: Legally Separated    Spouse name: Not on file  . Number of children: Not on file  . Years of education: Not on file  . Highest education level: Not on file  Occupational History  . Not on file  Tobacco Use  . Smoking status: Former Smoker    Packs/day: 1.50    Years: 12.00    Pack years: 18.00    Types: Cigars    Quit date: 05/31/1990    Years since quitting: 29.4  . Smokeless tobacco: Never  Used  . Tobacco comment: Quit smoking Cigars 10/2015  Substance and Sexual Activity  . Alcohol use: Yes    Alcohol/week: 1.0 - 2.0 standard drinks    Types: 1 - 2 Glasses of wine per week    Comment: 2-3 x weekly  . Drug use: No  . Sexual activity: Not on file  Other Topics Concern  . Not on file  Social History Narrative   Divorced.  Two children, both young adults (one son plans to go to med school).   Occupation: Grading contractor-also a Psychologist, sport and exercise.   Education: 2 years of college.   Orig from [place], still lives there.   Tob 30 pack-yr hx, quit around age 68.   Social drinker.     No regular exercise.  Eating habits poor.   Social Determinants of Health   Financial Resource Strain:   . Difficulty of Paying Living Expenses: Not on file  Food Insecurity:   . Worried About Charity fundraiser in the Last Year: Not on file  . Ran Out of Food in the Last Year: Not on file  Transportation Needs:   . Lack of Transportation (Medical): Not on file  . Lack of Transportation (Non-Medical): Not on file  Physical Activity:   . Days of Exercise per Week: Not on file  . Minutes of Exercise per Session: Not on file  Stress:   . Feeling of Stress : Not on file  Social Connections:   . Frequency of Communication with Friends and Family: Not on file  . Frequency of Social Gatherings with Friends and Family: Not on file  . Attends Religious Services: Not on file  . Active Member of Clubs or Organizations: Not on file  . Attends Archivist Meetings: Not on file  . Marital Status: Not on file  Intimate Partner Violence:   . Fear of Current or Ex-Partner: Not on file  . Emotionally Abused: Not on file  . Physically Abused: Not on file  . Sexually Abused: Not on file      PHYSICAL EXAM  Vitals:   11/02/19 0830  BP:  130/75  Pulse: (!) 59  Temp: 97.8 F (36.6 C)  Weight: 195 lb 8 oz (88.7 kg)  Height: 5\' 5"  (1.651 m)   Body mass index is 32.53 kg/m.  Generalized:  Well developed, in no acute distress  Cardiology: normal rate and rhythm, no murmur noted Respiratory: Clear to auscultation bilaterally Neurological examination  Mentation: Alert oriented to time, place, history taking. Follows all commands speech and language fluent Cranial nerve II-XII: Pupils were equal round reactive to light. Extraocular movements were full, visual field were full  Motor: The motor testing reveals 5 over 5 strength of all 4 extremities. Good symmetric motor tone is noted throughout.  Gait and station: Gait is normal.    DIAGNOSTIC DATA (LABS, IMAGING, TESTING) - I reviewed patient records, labs, notes, testing and imaging myself where available.  No flowsheet data found.   Lab Results  Component Value Date   WBC 8.0 03/18/2019   HGB 16.9 03/18/2019   HCT 51.2 03/18/2019   MCV 90.6 03/18/2019   PLT 205.0 03/18/2019      Component Value Date/Time   NA 143 03/18/2019 0826   K 4.7 03/18/2019 0826   CL 107 03/18/2019 0826   CO2 28 03/18/2019 0826   GLUCOSE 138 (H) 03/18/2019 0826   BUN 22 03/18/2019 0826   CREATININE 1.03 03/18/2019 0826   CREATININE 0.95 07/21/2017 1539   CALCIUM 9.6 03/18/2019 0826   PROT 6.8 03/18/2019 0826   ALBUMIN 4.7 03/18/2019 0826   AST 16 03/18/2019 0826   ALT 18 03/18/2019 0826   ALKPHOS 77 03/18/2019 0826   BILITOT 0.6 03/18/2019 0826   GFRNONAA >60 03/05/2017 0951   GFRAA >60 03/05/2017 0951   Lab Results  Component Value Date   CHOL 221 (H) 06/21/2019   HDL 55.50 06/21/2019   LDLCALC 144 (H) 06/21/2019   TRIG 106.0 06/21/2019   CHOLHDL 4 06/21/2019   Lab Results  Component Value Date   HGBA1C 5.6 09/16/2019   HGBA1C 5.6 09/16/2019   HGBA1C 5.6 (A) 09/16/2019   HGBA1C 5.6 09/16/2019   Lab Results  Component Value Date   VITAMINB12 515 08/08/2015   Lab Results  Component Value Date   TSH 1.40 03/18/2019     ASSESSMENT AND PLAN 59 y.o. year old male  has a past medical history of Asthma, Atypical chest  pain (06/12/2014), Blood in stool, Carpal tunnel syndrome, Cervical myelopathy ([place]), Chronic low back pain, Diabetes mellitus without complication ([place]), Gout, Hyperkalemia (2015/2016), Hyperlipidemia, Hypertension, OSA (obstructive sleep apnea), Paresthesia of both hands, and Umbilical hernia (Q000111Q). here with     ICD-10-CM   1. OSA on CPAP  G47.33    Z99.89     Melaki is doing very well on CPAP therapy compliance report reveals excellent compliance.  He was encouraged to continue using CPAP nightly and for greater than 4 hours each night.  Copy of compliance report sent with patient to provide to DOT.  He will follow-up with me in 6 months, sooner if needed.  He verbalizes understanding and agreement with this plan.   No orders of the defined types were placed in this encounter.    No orders of the defined types were placed in this encounter.     I spent 15 minutes with the patient. 50% of this time was spent counseling and educating patient on plan of care and medications.    Debbora Presto, FNP-C 11/02/2019, 8:33 AM [facility] [address] [place], [place] [address] [phone]  [phone]  

## 2019-11-04 ENCOUNTER — Ambulatory Visit: Payer: Self-pay | Admitting: Family Medicine

## 2019-11-27 DIAGNOSIS — G4733 Obstructive sleep apnea (adult) (pediatric): Secondary | ICD-10-CM | POA: Diagnosis not present

## 2019-12-27 DIAGNOSIS — G4733 Obstructive sleep apnea (adult) (pediatric): Secondary | ICD-10-CM | POA: Diagnosis not present

## 2020-01-03 ENCOUNTER — Other Ambulatory Visit: Payer: Self-pay

## 2020-01-03 MED ORDER — ALLOPURINOL 300 MG PO TABS: 300.0000 mg | ORAL_TABLET | Freq: Every day | ORAL | 1 refills | Status: DC

## 2020-01-05 ENCOUNTER — Other Ambulatory Visit: Payer: Self-pay

## 2020-01-05 ENCOUNTER — Telehealth: Payer: Self-pay

## 2020-01-05 MED ORDER — IRBESARTAN 75 MG PO TABS: 75.0000 mg | ORAL_TABLET | Freq: Every day | ORAL | 3 refills | Status: DC

## 2020-01-05 MED ORDER — ALLOPURINOL 300 MG PO TABS: 300.0000 mg | ORAL_TABLET | Freq: Every day | ORAL | 1 refills | Status: DC

## 2020-01-05 NOTE — Telephone Encounter (Signed)
Patient advised both medications have been resent to pharmacy.

## 2020-01-05 NOTE — Telephone Encounter (Signed)
irbesartan (AVAPRO) 75 MG tablet [id number]   Patient said he didn't have have the allopurinol(ZYLOPRIM) 300 MG tablet [id number]   [facility] does not have either prescription  He is out of blood pressure meds  [facility] [place] [place], [place] [address]  Phone:  [phone] Fax:  [phone]  DEA #:  --

## 2020-01-12 ENCOUNTER — Telehealth: Payer: Self-pay

## 2020-01-12 DIAGNOSIS — G4733 Obstructive sleep apnea (adult) (pediatric): Secondary | ICD-10-CM | POA: Diagnosis not present

## 2020-01-12 NOTE — Telephone Encounter (Signed)
Patient called in wanting to know if Dr could send in a Rx for ibuprofen 800 mg for pain.       Please call and advise

## 2020-01-12 NOTE — Telephone Encounter (Signed)
Patient made aware PCP out of office, med requested is not on current list and offered o/v. Pt scheduled for Thursday 5/27

## 2020-01-20 ENCOUNTER — Ambulatory Visit: Payer: BC Managed Care – PPO | Admitting: Family Medicine

## 2020-01-20 DIAGNOSIS — Z0289 Encounter for other administrative examinations: Secondary | ICD-10-CM

## 2020-01-21 ENCOUNTER — Encounter: Payer: Self-pay | Admitting: Family Medicine

## 2020-01-21 ENCOUNTER — Ambulatory Visit (INDEPENDENT_AMBULATORY_CARE_PROVIDER_SITE_OTHER): Payer: BC Managed Care – PPO | Admitting: Family Medicine

## 2020-01-21 ENCOUNTER — Other Ambulatory Visit: Payer: Self-pay

## 2020-01-21 VITALS — BP 155/98 | HR 62 | Temp 98.8°F | Resp 16 | Ht 65.0 in | Wt 198.4 lb

## 2020-01-21 DIAGNOSIS — M25512 Pain in left shoulder: Secondary | ICD-10-CM

## 2020-01-21 DIAGNOSIS — M7552 Bursitis of left shoulder: Secondary | ICD-10-CM | POA: Diagnosis not present

## 2020-01-21 DIAGNOSIS — J3089 Other allergic rhinitis: Secondary | ICD-10-CM

## 2020-01-21 MED ORDER — METHYLPREDNISOLONE ACETATE 80 MG/ML IJ SUSP
80.0000 mg | Freq: Once | INTRAMUSCULAR | Status: AC
  Administered 2020-01-21: 80 mg via INTRAMUSCULAR

## 2020-01-21 NOTE — Patient Instructions (Signed)
Take 2 sprays of flonase in each nostril once a day and 10mg  claritin tab daily. Use saline nasal spray multiple times per day to irrigate and moisturize nasal passages.  REST your shoulder for the next couple days. Apply ice when you get home today x 20 min and do this again before bed tonight. Do gentle, slow range of motion exercises with your shoulder. Ok to take 600 mg ibuprofen every 6 hours with food.

## 2020-01-21 NOTE — Progress Notes (Signed)
OFFICE VISIT  01/21/2020   CC:  Chief Complaint  Patient presents with  . Shoulder Pain    left, occurring for the last month   HPI:    Patient is a 59 y.o. Caucasian male who presents for shoulder pain.  Acute onset of L shoulder pain about 1 mo ago when stepping into hole coming off his trailer, L elbow hit and jammed his shoulder joint up. Constant dull ache in anterolateral shoulder, worse with ER/IR, occ catch with sharp pain with ROM, decreased strength b/c of pain.  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  Ibup helps  Also, has had 1 wk of nasal congestion, forehead headache, postnasal drip. Some cough, some wheezing, trouble breathing last night.  Was up all night, couldn't tolerate CPAP b/c couldn't breath through nose. Some malaise and fatigue, no fevers. Feels a bit better today. Onset of sx's after he spent all day in a tractor cab that had water damage and possible mold growth.  ROS: no fevers, no CP, no dizziness,no rashes, no melena/hematochezia.  No polyuria or polydipsia.  No myalgias or arthralgias.  No focal weakness, no new paresthesias, no tremors.  No acute vision or hearing abnormalities. No n/v/d or abd pain.  No palpitations.     Past Medical History:  Diagnosis Date  . Asthma    "grew out of it", esp after quitting smoking arond age 65 yrs old  . Atypical chest pain 06/12/2014  . Blood in stool   . Carpal tunnel syndrome    bilat, L>>R (Dr. Vertell Limber).  Hand symptoms prompted referral to rheum--lab w/u NEG.  Wrist splinting x 6 wks advised, not much help so low dose prednisone rx'd, then pt had to get steroid injection into L carpal tunnel 12/2016.  Marland Kitchen Cervical myelopathy [facility])    Surgical fusion--Dr Vertell Limber (cerv spinal stenosis).  Stable as of 08/2019 neurosurg f/u.  Marland Kitchen Chronic low back pain   . Diabetes mellitus without complication ([place])    No diab retinop as of 12/2014  . Gout    Knee and  toe--allopurinol since approx 2012 and has had no flares since being on this med (1/2 of 300 mg tab qd)  . Hyperkalemia 2015/2016   low K diet; cut lisinopril from 10mg  to 5mg  qd 11/2014.  Marland Kitchen Hyperlipidemia    med x 1 trial=adverse side effects so he stopped it.  LDL 130s 08/2014, then dx'd with DM 2 shortly after, pt refused another trial of statin 05/2017 and 2020.  Marland Kitchen Hypertension   . OSA (obstructive sleep apnea)    Remote past: had cpap but didn't use much .  REPEAT HOME SLEEP STUDY 09/16/19-> severe OSA, CPAP recommended.  . Paresthesia of both hands    Dr. Vertell Limber: MRI 01/2018: no acute abnormality.  Stable-to-improved chronic spine/spinal cord changes, fusion good.  Marland Kitchen Umbilical hernia [id number]    Past Surgical History:  Procedure Laterality Date  . ANTERIOR CERVICAL DECOMP/DISCECTOMY FUSION N/A 03/07/2017   Procedure: Cervical four-five, Cervical five-six, Cerivcal six- seven Anterior cervical decompression/discectomy/fusion WITH CERVICAL FIVE COREPECTOMY;  Surgeon: Erline Levine, MD;  Location: [place];  Service: Neurosurgery;  Laterality: N/A;  C4-5 C5-6 C6-7 Anterior cervical decompression/discectomy/fusion  . BACK SURGERY  12/05/2016   Lumbar, Dr. Vertell Limber at [facility] surgical center  . COLONOSCOPY W/ POLYPECTOMY  10/13/14   Hyperplastic; recall 10 yrs  . Sleep study  09/16/2019   Home sleep study--severe OSA->CPAP recommended  .  VASECTOMY    . WISDOM TOOTH EXTRACTION      Outpatient Medications Prior to Visit  Medication Sig Dispense Refill  . allopurinol (ZYLOPRIM) 300 MG tablet Take 1 tablet (300 mg total) by mouth daily. 30 tablet 1  . aspirin EC 81 MG tablet Take 81 mg by mouth daily.    Marland Kitchen gabapentin (NEURONTIN) 300 MG capsule in the morning and at bedtime.    . irbesartan (AVAPRO) 75 MG tablet Take 1 tablet (75 mg total) by mouth daily. 30 tablet 3  . Plant Sterols and Stanols (CHOLESTOFF PO) Take by mouth daily.    Vladimir Faster Glycol-Propyl Glycol (SYSTANE) 0.4-0.3 % SOLN Place  1-2 drops into both eyes 4 (four) times daily as needed (for dry eyes.).    Marland Kitchen baclofen (LIORESAL) 10 MG tablet Take 1 tablet by mouth 3 (three) times daily as needed.  1   No facility-administered medications prior to visit.    Allergies  Allergen Reactions  . Prednisone Other (See Comments)    Headaches, muscle aches, fatigue  . Lisinopril Cough    ROS As per HPI  PE: Vitals with BMI 01/21/2020 11/02/2019 09/16/2019  Height 5\' 5"  5\' 5"  5\' 5"   Weight 198 lbs 6 oz 195 lbs 8 oz 191 lbs 6 oz  BMI 33.02 Q000111Q [id number]  Systolic [id number]  Diastolic 98 75 85  Pulse 62 59 64    Gen: Alert, well appearing.  Patient is oriented to person, place, time, and situation. AFFECT: pleasant, lucid thought and speech. ENT: Ears: EACs clear, normal epithelium.  TMs with good light reflex and landmarks bilaterally.  Eyes: no injection, icteris, swelling, or exudate.  EOMI, PERRLA. Nose: no drainage or turbinate edema/swelling.  No injection or focal lesion.  Mouth: lips without lesion/swelling.  Oral mucosa pink and moist.  Dentition intact and without obvious caries or gingival swelling.  Oropharynx without erythema, exudate, or swelling.  CV: RRR, no m/r/g.   LUNGS: CTA bilat, nonlabored resps, good aeration in all lung fields. L shoulder TTP around anterolateral aspect of acromion. +supraspinatus sign, + pain with resisted ER and IR.  Neg drop sign. No AC jt tend ABduction max 110 deg, otherwise ROM intact. UE strength 5/5 prox/dist bilat.   +Impingement sign.   LABS:    Chemistry      Component Value Date/Time   NA 143 03/18/2019 0826   K 4.7 03/18/2019 0826   CL 107 03/18/2019 0826   CO2 28 03/18/2019 0826   BUN 22 03/18/2019 0826   CREATININE 1.03 03/18/2019 0826   CREATININE 0.95 07/21/2017 1539      Component Value Date/Time   CALCIUM 9.6 03/18/2019 0826   ALKPHOS 77 03/18/2019 0826   AST 16 03/18/2019 0826   ALT 18 03/18/2019 0826   BILITOT 0.6 03/18/2019 0826     Lab  Results  Component Value Date   WBC 8.0 03/18/2019   HGB 16.9 03/18/2019   HCT 51.2 03/18/2019   MCV 90.6 03/18/2019   PLT 205.0 03/18/2019   Lab Results  Component Value Date   HGBA1C 5.6 09/16/2019   HGBA1C 5.6 09/16/2019   HGBA1C 5.6 (A) 09/16/2019   HGBA1C 5.6 09/16/2019   Lab Results  Component Value Date   CHOL 221 (H) 06/21/2019   HDL 55.50 06/21/2019   LDLCALC 144 (H) 06/21/2019   TRIG 106.0 06/21/2019   CHOLHDL 4 06/21/2019   IMPRESSION AND PLAN:  1) Left shoulder pain: RC tendonitis/subacrom bursitis. Chart with prednisone  allergy listed--pt states it did make him a little dizzy but no allergic rxn.  He was ok with intra-articular injection today.  Procedure: Therapeutic shoulder injection.  The patient's clinical condition is marked by substantial pain and/or significant functional disability.  Other conservative therapy has not provided relief, is contraindicated, or not appropriate.  There is a reasonable likelihood that injection will significantly improve the patient's pain and/or functional disability. Cleaned skin with alcohol swab, used posterolateral approach, Injected 80mg  depomedrol plus 2 ml of plain 1% lidocaine into subacromial space without resistance.  No immediate complications.  Patient tolerated procedure well.  Post-injection care discussed, including 20 min of icing 1-2 times in the next 4-8 hours, frequent non weight-bearing ROM exercises over the next few days, and general pain medication management.  2) Allergic rhino-sinusitis (fungal spores suspected): improved the last 24h. OTC flonase, claritin, and saline nasal spray recommended. Signs/symptoms to call or return for were reviewed and pt expressed understanding.  Due for [facility] f/u and labs --f/u for this 1 mo.  An After Visit Summary was printed and given to the patient.  FOLLow UP: No follow-ups on file.  Signed:  Crissie Sickles, MD           01/21/2020

## 2020-01-27 DIAGNOSIS — G4733 Obstructive sleep apnea (adult) (pediatric): Secondary | ICD-10-CM | POA: Diagnosis not present

## 2020-02-26 DIAGNOSIS — G4733 Obstructive sleep apnea (adult) (pediatric): Secondary | ICD-10-CM | POA: Diagnosis not present

## 2020-03-28 DIAGNOSIS — G4733 Obstructive sleep apnea (adult) (pediatric): Secondary | ICD-10-CM | POA: Diagnosis not present

## 2020-04-28 DIAGNOSIS — G4733 Obstructive sleep apnea (adult) (pediatric): Secondary | ICD-10-CM | POA: Diagnosis not present

## 2020-05-04 ENCOUNTER — Ambulatory Visit: Payer: BC Managed Care – PPO | Admitting: Family Medicine

## 2020-05-04 ENCOUNTER — Encounter: Payer: Self-pay | Admitting: Family Medicine

## 2020-05-04 VITALS — BP 120/84 | HR 88 | Ht 65.0 in | Wt 197.2 lb

## 2020-05-04 DIAGNOSIS — Z9989 Dependence on other enabling machines and devices: Secondary | ICD-10-CM

## 2020-05-04 DIAGNOSIS — G4733 Obstructive sleep apnea (adult) (pediatric): Secondary | ICD-10-CM

## 2020-05-04 NOTE — Progress Notes (Signed)
PATIENT: Derrick Willis DOB: 1961/01/16  REASON FOR VISIT: follow up HISTORY FROM: patient  Chief Complaint  Patient presents with  . Follow-up    6 month f/u for OSA. States machine has been working well. Denies any new issues.   . room 7    alone     HISTORY OF PRESENT ILLNESS: Today 05/04/20 Derrick Willis is a 59 y.o. male here today for follow up for OSA on CPAP.   Compliance report dated 04/03/2020 through 05/02/2020 reveals that he used CPAP 27 of the past 30 days for compliance of 90%.  He is CPAP greater than 4 hours 27 of the past 30 days for compliance of 90%.  Average usage on days used was 6 hours and 38 minutes.  Residual AHI was 0.6 on 6 to 18 cm of water and an EPR of 2.  There was no significant leak noted.  HISTORY: (copied from my note on 11/02/2019)  Derrick Willis is a 59 y.o. male here today for follow up OSA recently started on CPAP therapy. HST revealed total AHI of 31.5/hr and REM sleep AHI of 52.3/hr. No prolonged hypoxemia noted.  Derrick reports that he is doing very well on CPAP therapy.  He notes significant improvement in sleep quality and feels less fatigued throughout the day.  He is now sleeping through the night without waking to use the restroom.  He denies any difficulty with his machine.  Compliance report dated 10/02/2019 through 10/31/2019 reveals that he is used CPAP 30 of the last 30 days for compliance of 100%.  He used CPAP greater than 4 hours all 30 days for compliance of 100%.  Average usage was 6 hours and 35 minutes.  Residual AHI was 1.2 on 6 to 18 cm of water and an EPR of 2.  There was no significant leak noted.  HISTORY: (copied from  note on 09/06/2019)  Derrick Willis a87year old Caucasianmalepatientseenuponreferralon1/06/2020. Chiefconcernaccording to patient :I have a CPAP from years ago, but it didn't do me any good. I had the study in [place], [place] at [facility]. The machine was mailed to me and I never  followed up.   Mr. Willis had childhood asthma it lasted probably through his 39s as long as he was smoking which she quit around age 11. I have the pleasure of seeingPaul K Johnstontoday,aright-handedCaucasian maleand DOT driver- Hehas a past medical history of Asthma, Atypical chest pain (06/12/2014), Blood in stool, Carpal tunnel syndrome, Cervical myelopathy ([place]), Chronic low back pain, Diabetes mellitus without complication ([place]), Gout, Hyperkalemia (2015/2016), Hyperlipidemia, Hypertension, Paresthesia of both hands, Sleep apnea, and Umbilical hernia (9417).  He has a history of carpal tunnel syndrome surgically treated by Dr. Vertell Limber 2018 cervical myelopathy and surgical fusion December 2019 by Dr. Vertell Limber, lower back degenerative disease. Gout affecting knee and toe, hyperkalemia, hyperlipidemia, hypertension, in June 2019 Dr. Joaquim Lai evaluated him for paresthesias of both hands. Is also history of umbilical hernia in 4081. And diabetes mellitus.  DOT evaluation questioned his lack of CPAP use and asked for re-evaluation. He is also seeing Dr Vertell Limber next week.   Sleeprelevant medical history: Nocturia- one, no Tonsillectomy , had cervical spine surgery, but no deviated septum or sinus surgery, no UPPP. he had tubes in his ears at kindergarten age.  Familymedical /sleep history:other family member on CPAP with OSA, insomnia, sleep walkers.  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 and lives  in a household alone with his dog.  He is divorced, his wife reportedly was very bothered by his snoring.  He has 2 adult sons, one in DO school.  The patient currently works. Tobacco use; quit at age 37 .  ETOH use - 2-3 drinks a week, Caffeine intake in form of Coffee( part decaffeinated 4-6 cups a day ) Soda( none ) Tea ( once I a while ) or energy drinks. Regular exercise in form of walking   Sleep habits are as  follows:The patient's dinner time is between 6-8 PM. The patient goes to bed at 10 PM and continues to sleep for many hours,. The preferred sleep position is sideways, with the support of 2 pillows. Dreams are reportedly frequent. 6 AM is the usual rise time. The patient wakes up spontaneously at 5-6 AM He reports feeling refreshed or restored in AM, with symptoms such as dry mouth, but no morning headaches, and only mild residual fatigue.  Naps are taken in frequently    REVIEW OF SYSTEMS: Out of a complete 14 system review of symptoms, the patient complains only of the following symptoms, none and all other reviewed systems are negative.   ALLERGIES: Allergies  Allergen Reactions  . Prednisone Other (See Comments)    Headaches, muscle aches, fatigue  . Lisinopril Cough    HOME MEDICATIONS: Outpatient Medications Prior to Visit  Medication Sig Dispense Refill  . allopurinol (ZYLOPRIM) 300 MG tablet Take 1 tablet (300 mg total) by mouth daily. 30 tablet 1  . aspirin EC 81 MG tablet Take 81 mg by mouth daily.    Marland Kitchen gabapentin (NEURONTIN) 300 MG capsule in the morning and at bedtime.    . irbesartan (AVAPRO) 75 MG tablet Take 1 tablet (75 mg total) by mouth daily. 30 tablet 3  . Plant Sterols and Stanols (CHOLESTOFF PO) Take by mouth daily.    Vladimir Faster Glycol-Propyl Glycol (SYSTANE) 0.4-0.3 % SOLN Place 1-2 drops into both eyes 4 (four) times daily as needed (for dry eyes.).     No facility-administered medications prior to visit.    PAST MEDICAL HISTORY: Past Medical History:  Diagnosis Date  . Asthma    "grew out of it", esp after quitting smoking arond age 31 yrs old  . Atypical chest pain 06/12/2014  . Blood in stool   . Carpal tunnel syndrome    bilat, L>>R (Dr. Vertell Limber).  Hand symptoms prompted referral to rheum--lab w/u NEG.  Wrist splinting x 6 wks advised, not much help so low dose prednisone rx'd, then pt had to get steroid injection into L carpal tunnel 12/2016.    Marland Kitchen Cervical myelopathy [facility])    Surgical fusion--Dr Vertell Limber (cerv spinal stenosis).  Stable as of 08/2019 neurosurg f/u.  Marland Kitchen Chronic low back pain   . Diabetes mellitus without complication ([place])    No diab retinop as of 12/2014  . Gout    Knee and toe--allopurinol since approx 2012 and has had no flares since being on this med (1/2 of 300 mg tab qd)  . Hyperkalemia 2015/2016   low K diet; cut lisinopril from 10mg  to 5mg  qd 11/2014.  Marland Kitchen Hyperlipidemia    med x 1 trial=adverse side effects so he stopped it.  LDL 130s 08/2014, then dx'd with DM 2 shortly after, pt refused another trial of statin 05/2017 and 2020.  Marland Kitchen Hypertension   . OSA (obstructive sleep apnea)    Remote past: had cpap but didn't use much .  REPEAT  HOME SLEEP STUDY 09/16/19-> severe OSA, CPAP recommended.  . Paresthesia of both hands    Dr. Vertell Limber: MRI 01/2018: no acute abnormality.  Stable-to-improved chronic spine/spinal cord changes, fusion good.  Marland Kitchen Umbilical hernia [id number]    PAST SURGICAL HISTORY: Past Surgical History:  Procedure Laterality Date  . ANTERIOR CERVICAL DECOMP/DISCECTOMY FUSION N/A 03/07/2017   Procedure: Cervical four-five, Cervical five-six, Cerivcal six- seven Anterior cervical decompression/discectomy/fusion WITH CERVICAL FIVE COREPECTOMY;  Surgeon: Erline Levine, MD;  Location: [facility];  Service: Neurosurgery;  Laterality: N/A;  C4-5 C5-6 C6-7 Anterior cervical decompression/discectomy/fusion  . BACK SURGERY  12/05/2016   Lumbar, Dr. Vertell Limber at [facility] surgical center  . COLONOSCOPY W/ POLYPECTOMY  10/13/14   Hyperplastic; recall 10 yrs  . Sleep study  09/16/2019   Home sleep study--severe OSA->CPAP recommended  . VASECTOMY    . WISDOM TOOTH EXTRACTION      FAMILY HISTORY: Family History  Problem Relation Age of Onset  . Arthritis Mother   . Arthritis Father   . Hyperlipidemia Father   . Hypertension Father   . Colon polyps Neg Hx   . Esophageal cancer Neg Hx   . Rectal cancer Neg Hx   . Stomach  cancer Neg Hx     SOCIAL HISTORY: Social History   Socioeconomic History  . Marital status: Legally Separated    Spouse name: Not on file  . Number of children: Not on file  . Years of education: Not on file  . Highest education level: Not on file  Occupational History  . Not on file  Tobacco Use  . Smoking status: Former Smoker    Packs/day: 1.50    Years: 12.00    Pack years: 18.00    Types: Cigars    Quit date: 05/31/1990    Years since quitting: 29.9  . Smokeless tobacco: Never Used  . Tobacco comment: Quit smoking Cigars 10/2015  Vaping Use  . Vaping Use: Never used  Substance and Sexual Activity  . Alcohol use: Yes    Alcohol/week: 1.0 - 2.0 standard drink    Types: 1 - 2 Glasses of wine per week    Comment: 2-3 x weekly  . Drug use: No  . Sexual activity: Not on file  Other Topics Concern  . Not on file  Social History Narrative   Divorced.  Two children, both young adults (one son plans to go to med school).   Occupation: Grading contractor-also a Psychologist, sport and exercise.   Education: 2 years of college.   Orig from [place], still lives there.   Tob 30 pack-yr hx, quit around age 65.   Social drinker.     No regular exercise.  Eating habits poor.   Social Determinants of Health   Financial Resource Strain:   . Difficulty of Paying Living Expenses: Not on file  Food Insecurity:   . Worried About Charity fundraiser in the Last Year: Not on file  . Ran Out of Food in the Last Year: Not on file  Transportation Needs:   . Lack of Transportation (Medical): Not on file  . Lack of Transportation (Non-Medical): Not on file  Physical Activity:   . Days of Exercise per Week: Not on file  . Minutes of Exercise per Session: Not on file  Stress:   . Feeling of Stress : Not on file  Social Connections:   . Frequency of Communication with Friends and Family: Not on file  . Frequency of Social Gatherings with Friends and Family:  Not on file  . Attends Religious Services: Not on  file  . Active Member of Clubs or Organizations: Not on file  . Attends Archivist Meetings: Not on file  . Marital Status: Not on file  Intimate Partner Violence:   . Fear of Current or Ex-Partner: Not on file  . Emotionally Abused: Not on file  . Physically Abused: Not on file  . Sexually Abused: Not on file      PHYSICAL EXAM  Vitals:   05/04/20 0823  BP: 120/84  Pulse: 88  Weight: 197 lb 3.2 oz (89.4 kg)  Height: 5\' 5"  (1.651 m)   Body mass index is 32.82 kg/m.  Generalized: Well developed, in no acute distress  Cardiology: normal rate and rhythm, no murmur noted Respiratory: clear to auscultation bilaterally  Neurological examination  Mentation: Alert oriented to time, place, history taking. Follows all commands speech and language fluent Cranial nerve II-XII: Pupils were equal round reactive to light. Extraocular movements were full, visual field were full  Motor: The motor testing reveals 5 over 5 strength of all 4 extremities. Good symmetric motor tone is noted throughout.  Gait and station: Gait is normal.    DIAGNOSTIC DATA (LABS, IMAGING, TESTING) - I reviewed patient records, labs, notes, testing and imaging myself where available.  No flowsheet data found.   Lab Results  Component Value Date   WBC 8.0 03/18/2019   HGB 16.9 03/18/2019   HCT 51.2 03/18/2019   MCV 90.6 03/18/2019   PLT 205.0 03/18/2019      Component Value Date/Time   NA 143 03/18/2019 0826   K 4.7 03/18/2019 0826   CL 107 03/18/2019 0826   CO2 28 03/18/2019 0826   GLUCOSE 138 (H) 03/18/2019 0826   BUN 22 03/18/2019 0826   CREATININE 1.03 03/18/2019 0826   CREATININE 0.95 07/21/2017 1539   CALCIUM 9.6 03/18/2019 0826   PROT 6.8 03/18/2019 0826   ALBUMIN 4.7 03/18/2019 0826   AST 16 03/18/2019 0826   ALT 18 03/18/2019 0826   ALKPHOS 77 03/18/2019 0826   BILITOT 0.6 03/18/2019 0826   GFRNONAA >60 03/05/2017 0951   GFRAA >60 03/05/2017 0951   Lab Results  Component  Value Date   CHOL 221 (H) 06/21/2019   HDL 55.50 06/21/2019   LDLCALC 144 (H) 06/21/2019   TRIG 106.0 06/21/2019   CHOLHDL 4 06/21/2019   Lab Results  Component Value Date   HGBA1C 5.6 09/16/2019   HGBA1C 5.6 09/16/2019   HGBA1C 5.6 (A) 09/16/2019   HGBA1C 5.6 09/16/2019   Lab Results  Component Value Date   VITAMINB12 515 08/08/2015   Lab Results  Component Value Date   TSH 1.40 03/18/2019       ASSESSMENT AND PLAN 59 y.o. year old male  has a past medical history of Asthma, Atypical chest pain (06/12/2014), Blood in stool, Carpal tunnel syndrome, Cervical myelopathy ([place]), Chronic low back pain, Diabetes mellitus without complication ([place]), Gout, Hyperkalemia (2015/2016), Hyperlipidemia, Hypertension, OSA (obstructive sleep apnea), Paresthesia of both hands, and Umbilical hernia (0240). here with     ICD-10-CM   1. OSA on CPAP  G47.33 For home use only DME continuous positive airway pressure (CPAP)   Z99.89      Delynn is doing very well on CPAP therapy.  Compliance report reveals optimal compliance.  He was encouraged to continue using CPAP nightly and for greater than 4 hours each night.  Healthy lifestyle habits encouraged.  He will follow-up with  primary care as directed.  He will return to see [place] in 1 year, sooner if needed.  He verbalizes understanding and agreement with this plan.   Orders Placed This Encounter  Procedures  . For home use only DME continuous positive airway pressure (CPAP)    Supplies    Order Specific Question:   Length of Need    Answer:   Lifetime    Order Specific Question:   Patient has OSA or probable OSA    Answer:   Yes    Order Specific Question:   Is the patient currently using CPAP in the home    Answer:   Yes    Order Specific Question:   Settings    Answer:   Other see comments    Order Specific Question:   CPAP supplies needed    Answer:   Mask, headgear, cushions, filters, heated tubing and water chamber     No orders of the  defined types were placed in this encounter.     I spent 15 minutes with the patient. 50% of this time was spent counseling and educating patient on plan of care and medications.    Debbora Presto, FNP-C 05/04/2020, 8:31 AM [facility] [address] [place], [place] [address] [phone]

## 2020-05-04 NOTE — Patient Instructions (Signed)
Please continue using your CPAP regularly. While your insurance requires that you use CPAP at least 4 hours each night on 70% of the nights, I recommend, that you not skip any nights and use it throughout the night if you can. Getting used to CPAP and staying with the treatment long term does take time and patience and discipline. Untreated obstructive sleep apnea when it is moderate to severe can have an adverse impact on cardiovascular health and raise her risk for heart disease, arrhythmias, hypertension, congestive heart failure, stroke and diabetes. Untreated obstructive sleep apnea causes sleep disruption, nonrestorative sleep, and sleep deprivation. This can have an impact on your day to day functioning and cause daytime sleepiness and impairment of cognitive function, memory loss, mood disturbance, and problems focussing. Using CPAP regularly can improve these symptoms.   Follow up in 1 year   CPAP and BPAP Information CPAP and BPAP are methods of helping a person breathe with the use of air pressure. CPAP stands for "continuous positive airway pressure." BPAP stands for "bi-level positive airway pressure." In both methods, air is blown through your nose or mouth and into your air passages to help you breathe well. CPAP and BPAP use different amounts of pressure to blow air. With CPAP, the amount of pressure stays the same while you breathe in and out. With BPAP, the amount of pressure is increased when you breathe in (inhale) so that you can take larger breaths. Your health care provider will recommend whether CPAP or BPAP would be more helpful for you. Why are CPAP and BPAP treatments used? CPAP or BPAP can be helpful if you have:  Sleep apnea.  Chronic obstructive pulmonary disease (COPD).  Heart failure.  Medical conditions that weaken the muscles of the chest including muscular dystrophy, or neurological diseases such as amyotrophic lateral sclerosis (ALS).  Other problems that  cause breathing to be weak, abnormal, or difficult. CPAP is most commonly used for obstructive sleep apnea (OSA) to keep the airways from collapsing when the muscles relax during sleep. How is CPAP or BPAP administered? Both CPAP and BPAP are provided by a small machine with a flexible plastic tube that attaches to a plastic mask. You wear the mask. Air is blown through the mask into your nose or mouth. The amount of pressure that is used to blow the air can be adjusted on the machine. Your health care provider will determine the pressure setting that should be used based on your individual needs. When should CPAP or BPAP be used? In most cases, the mask only needs to be worn during sleep. Generally, the mask needs to be worn throughout the night and during any daytime naps. People with certain medical conditions may also need to wear the mask at other times when they are awake. Follow instructions from your health care provider about when to use the machine. What are some tips for using the mask?   Because the mask needs to be snug, some people feel trapped or closed-in (claustrophobic) when first using the mask. If you feel this way, you may need to get used to the mask. One way to do this is by holding the mask loosely over your nose or mouth and then gradually applying the mask more snugly. You can also gradually increase the amount of time that you use the mask.  Masks are available in various types and sizes. Some fit over your mouth and nose while others fit over just your nose. If your mask  does not fit well, talk with your health care provider about getting a different one.  If you are using a mask that fits over your nose and you tend to breathe through your mouth, a chin strap may be applied to help keep your mouth closed.  The CPAP and BPAP machines have alarms that may sound if the mask comes off or develops a leak.  If you have trouble with the mask, it is very important that you talk  with your health care provider about finding a way to make the mask easier to tolerate. Do not stop using the mask. Stopping the use of the mask could have a negative impact on your health. What are some tips for using the machine?  Place your CPAP or BPAP machine on a secure table or stand near an electrical outlet.  Know where the on/off switch is located on the machine.  Follow instructions from your health care provider about how to set the pressure on your machine and when you should use it.  Do not eat or drink while the CPAP or BPAP machine is on. Food or fluids could get pushed into your lungs by the pressure of the CPAP or BPAP.  Do not smoke. Tobacco smoke residue can damage the machine.  For home use, CPAP and BPAP machines can be rented or purchased through home health care companies. Many different brands of machines are available. Renting a machine before purchasing may help you find out which particular machine works well for you.  Keep the CPAP or BPAP machine and attachments clean. Ask your health care provider for specific instructions. Get help right away if:  You have redness or open areas around your nose or mouth where the mask fits.  You have trouble using the CPAP or BPAP machine.  You cannot tolerate wearing the CPAP or BPAP mask.  You have pain, discomfort, and bloating in your abdomen. Summary  CPAP and BPAP are methods of helping a person breathe with the use of air pressure.  Both CPAP and BPAP are provided by a small machine with a flexible plastic tube that attaches to a plastic mask.  If you have trouble with the mask, it is very important that you talk with your health care provider about finding a way to make the mask easier to tolerate. This information is not intended to replace advice given to you by your health care provider. Make sure you discuss any questions you have with your health care provider. Document Revised: 12/02/2018 Document  Reviewed: 07/01/2016 Elsevier Patient Education  [place].

## 2020-05-09 ENCOUNTER — Other Ambulatory Visit: Payer: Self-pay | Admitting: Family Medicine

## 2020-05-10 NOTE — Progress Notes (Addendum)
CM sent to [facility] for new cpap orders. Jacquelyne Balint, RN Thank you, will process.

## 2020-05-26 DIAGNOSIS — U071 COVID-19: Secondary | ICD-10-CM

## 2020-05-26 HISTORY — DX: COVID-19: U07.1

## 2020-05-28 DIAGNOSIS — G4733 Obstructive sleep apnea (adult) (pediatric): Secondary | ICD-10-CM | POA: Diagnosis not present

## 2020-06-20 ENCOUNTER — Telehealth (INDEPENDENT_AMBULATORY_CARE_PROVIDER_SITE_OTHER): Payer: BC Managed Care – PPO | Admitting: Family Medicine

## 2020-06-20 ENCOUNTER — Other Ambulatory Visit: Payer: Self-pay

## 2020-06-20 DIAGNOSIS — U071 COVID-19: Secondary | ICD-10-CM | POA: Diagnosis not present

## 2020-06-20 DIAGNOSIS — R059 Cough, unspecified: Secondary | ICD-10-CM | POA: Diagnosis not present

## 2020-06-20 DIAGNOSIS — J4520 Mild intermittent asthma, uncomplicated: Secondary | ICD-10-CM

## 2020-06-20 MED ORDER — ALBUTEROL SULFATE HFA 108 (90 BASE) MCG/ACT IN AERS
2.0000 | INHALATION_SPRAY | Freq: Four times a day (QID) | RESPIRATORY_TRACT | 0 refills | Status: DC | PRN
Start: 2020-06-20 — End: 2020-09-08

## 2020-06-20 MED ORDER — BENZONATATE 100 MG PO CAPS
100.0000 mg | ORAL_CAPSULE | Freq: Three times a day (TID) | ORAL | 0 refills | Status: DC | PRN
Start: 2020-06-20 — End: 2020-06-28

## 2020-06-20 NOTE — Patient Instructions (Addendum)
   It was nice to meet you today, and I really hope you are feeling better soon. I help [facility] out with telemedicine visits on Tuesdays and Thursdays and am available for visits on those days. If you have any concerns or questions following this visit please schedule a follow up visit with your Primary Care doctor or seek care at a local urgent care clinic to avoid delays in care.    Seek in person care promptly if your symptoms worsen, new concerns arise or you are not improving with treatment. Call 911 and/or seek emergency care if you symptoms are severe or life threatening.  -stay home for a full 10 days since the onset of symptoms PLUS one day of no fever and feeling better  -I sent the medication(s) we discussed to your pharmacy: Meds ordered this encounter  Medications  . benzonatate (TESSALON PERLES) 100 MG capsule    Sig: Take 1 capsule (100 mg total) by mouth 3 (three) times daily as needed.    Dispense:  20 capsule    Refill:  0  . albuterol (PROAIR HFA) 108 (90 Base) MCG/ACT inhaler    Sig: Inhale 2 puffs into the lungs every 6 (six) hours as needed for wheezing or shortness of breath.    Dispense:  1 each    Refill:  0    -call number provided for outpatient [facility] [phone] - sent a referral for you to the infusion clinic for treatment for the Covid.  -can use tylenol or aleve if needed for fevers, aches and pains per instructions  -can use nasal saline a few times per day if nasal congestion  -stay hydrated, drink plenty of fluids and eat small healthy meals - avoid dairy  -check out the [facility] website for more information on home care, transmission and treatment for COVID19  -follow up with your doctor or with the urgent care in 2-3 days unless improving and feeling better

## 2020-06-20 NOTE — Progress Notes (Signed)
Virtual Visit via Video Note  I connected with Mckinnley  on 06/20/20 at  3:00 PM EDT by a video enabled telemedicine application and verified that I am speaking with the correct person using two identifiers.  Location patient: home Location provider:work or home office Persons participating in the virtual visit: patient, provider  I discussed the limitations of evaluation and management by telemedicine and the availability of in person appointments. The patient expressed understanding and agreed to proceed.   HPI:  Acute telemedicine visit for cough and congestion: -Onset: 6 days ago started feeling sick, but had some congestion and a cough before that that he thought was allergies -did covid test yesterday and was positive -Symptoms include: nasal congestion and cough, body aches, fatigue -now feeling better, but still with some congestion and cough -Denies:SOB, CP, vomiting, diarrhea, inability to get out of bed/eat/drink -son and coworkers and other family members have been sick -Has tried: musinex, ibuprofen, nyquil, vit C -Pertinent past medical history: hx of asthma, DM, HTN, overweight -Pertinent medication allergies: -COVID-19 vaccine status: not vaccinated  ROS: See pertinent positives and negatives per HPI.  Past Medical History:  Diagnosis Date  . Asthma    "grew out of it", esp after quitting smoking arond age 71 yrs old  . Atypical chest pain 06/12/2014  . Blood in stool   . Carpal tunnel syndrome    bilat, L>>R (Dr. Vertell Limber).  Hand symptoms prompted referral to rheum--lab w/u NEG.  Wrist splinting x 6 wks advised, not much help so low dose prednisone rx'd, then pt had to get steroid injection into L carpal tunnel 12/2016.  [place] Kitchen Cervical myelopathy [facility])    Surgical fusion--Dr Vertell Limber (cerv spinal stenosis).  Stable as of 08/2019 neurosurg f/u.  Marland Kitchen Chronic low back pain   . Diabetes mellitus without complication ([place])    No diab retinop as of 12/2014  . Gout    Knee and  toe--allopurinol since approx 2012 and has had no flares since being on this med (1/2 of 300 mg tab qd)  . Hyperkalemia 2015/2016   low K diet; cut lisinopril from 10mg  to 5mg  qd 11/2014.  [place] Kitchen Hyperlipidemia    med x 1 trial=adverse side effects so he stopped it.  LDL 130s 08/2014, then dx'd with DM 2 shortly after, pt refused another trial of statin 05/2017 and 2020.  Marland Kitchen Hypertension   . OSA (obstructive sleep apnea)    Remote past: had cpap but didn't use much .  REPEAT HOME SLEEP STUDY 09/16/19-> severe OSA, CPAP recommended.  . Paresthesia of both hands    Dr. Vertell Limber: MRI 01/2018: no acute abnormality.  Stable-to-improved chronic spine/spinal cord changes, fusion good.  Marland Kitchen Umbilical hernia [id number]    Past Surgical History:  Procedure Laterality Date  . ANTERIOR CERVICAL DECOMP/DISCECTOMY FUSION N/A 03/07/2017   Procedure: Cervical four-five, Cervical five-six, Cerivcal six- seven Anterior cervical decompression/discectomy/fusion WITH CERVICAL FIVE COREPECTOMY;  Surgeon: Erline Levine, MD;  Location: [place];  Service: Neurosurgery;  Laterality: N/A;  C4-5 C5-6 C6-7 Anterior cervical decompression/discectomy/fusion  . BACK SURGERY  12/05/2016   Lumbar, Dr. Vertell Limber at [facility] surgical center  . COLONOSCOPY W/ POLYPECTOMY  10/13/14   Hyperplastic; recall 10 yrs  . Sleep study  09/16/2019   Home sleep study--severe OSA->CPAP recommended  . VASECTOMY    . WISDOM TOOTH EXTRACTION       Current Outpatient Medications:  .  albuterol (PROAIR HFA) 108 (90 Base) MCG/ACT inhaler, Inhale 2 puffs into the lungs every  6 (six) hours as needed for wheezing or shortness of breath., Disp: 1 each, Rfl: 0 .  allopurinol (ZYLOPRIM) 300 MG tablet, Take 1 tablet (300 mg total) by mouth daily., Disp: 30 tablet, Rfl: 1 .  aspirin EC 81 MG tablet, Take 81 mg by mouth daily., Disp: , Rfl:  .  benzonatate (TESSALON PERLES) 100 MG capsule, Take 1 capsule (100 mg total) by mouth 3 (three) times daily as needed., Disp: 20  capsule, Rfl: 0 .  gabapentin (NEURONTIN) 300 MG capsule, in the morning and at bedtime., Disp: , Rfl:  .  irbesartan (AVAPRO) 75 MG tablet, Take 1 tablet by mouth once daily, Disp: 90 tablet, Rfl: 0 .  Plant Sterols and Stanols (CHOLESTOFF PO), Take by mouth daily., Disp: , Rfl:  .  Polyethyl Glycol-Propyl Glycol (SYSTANE) 0.4-0.3 % SOLN, Place 1-2 drops into both eyes 4 (four) times daily as needed (for dry eyes.)., Disp: , Rfl:   EXAM:  VITALS per patient if applicable:  GENERAL: alert, oriented, appears well and in no acute distress  HEENT: atraumatic, conjunttiva clear, no obvious abnormalities on inspection of external nose and ears  NECK: normal movements of the head and neck  LUNGS: on inspection no signs of respiratory distress, breathing rate appears normal, no obvious gross SOB, gasping or wheezing  CV: no obvious cyanosis  MS: moves all visible extremities without noticeable abnormality  PSYCH/NEURO: pleasant and cooperative, no obvious depression or anxiety, speech and thought processing grossly intact  ASSESSMENT AND PLAN:  Discussed the following assessment and plan:  COVID-19 virus infection  Cough  Mild intermittent asthma without complication  -we discussed possible serious and likely etiologies, options for evaluation and workup, limitations of telemedicine visit vs in person visit, treatment, treatment risks and precautions. Pt prefers to treat via telemedicine empirically rather than in person at this moment.  Discussed treatment options for Covid, home isolation, potential complications and precautions.  He opted for Mab and request was sent to the Mab infusion clinic via epic.  Also provided patient with the contact information for the treatment center.  Sent Rx for Tessalon for cough.  Also sent refills on albuterol in case he has any asthma symptoms, currently he denies any breathing issues or wheezing. Work/School slipped offered:  declined Scheduled  follow up with PCP offered: Declined, but he agrees to schedule follow-up if needed.  Advised to seek prompt in person care if worsening, new symptoms arise, or if is not improving with treatment. Discussed options for inperson care if PCP office not available. Did let this patient know that I only do telemedicine on Tuesdays and Thursdays for [place]. Advised to schedule follow up visit with PCP or [facility] if any further questions or concerns to avoid delays in care.   I discussed the assessment and treatment plan with the patient. The patient was provided an opportunity to ask questions and all were answered. The patient agreed with the plan and demonstrated an understanding of the instructions.     Lucretia Kern, DO

## 2020-06-21 ENCOUNTER — Other Ambulatory Visit: Payer: Self-pay | Admitting: Nurse Practitioner

## 2020-06-21 ENCOUNTER — Telehealth (HOSPITAL_COMMUNITY): Payer: Self-pay

## 2020-06-21 ENCOUNTER — Ambulatory Visit (HOSPITAL_COMMUNITY)
Admission: RE | Admit: 2020-06-21 | Discharge: 2020-06-21 | Disposition: A | Discharge status: Home / Self Care | Payer: BC Managed Care – PPO | Source: Ambulatory Visit | Attending: Pulmonary Disease | Admitting: Pulmonary Disease

## 2020-06-21 ENCOUNTER — Telehealth: Payer: Self-pay | Admitting: Unknown Physician Specialty

## 2020-06-21 DIAGNOSIS — E118 Type 2 diabetes mellitus with unspecified complications: Secondary | ICD-10-CM

## 2020-06-21 DIAGNOSIS — E669 Obesity, unspecified: Secondary | ICD-10-CM | POA: Insufficient documentation

## 2020-06-21 DIAGNOSIS — U071 COVID-19: Secondary | ICD-10-CM

## 2020-06-21 DIAGNOSIS — Z20822 Contact with and (suspected) exposure to covid-19: Secondary | ICD-10-CM | POA: Diagnosis not present

## 2020-06-21 MED ORDER — DIPHENHYDRAMINE HCL 50 MG/ML IJ SOLN: 50.0000 mg | Freq: Once | INTRAMUSCULAR | Status: DC | PRN

## 2020-06-21 MED ORDER — EPINEPHRINE 0.3 MG/0.3ML IJ SOAJ: 0.3000 mg | Freq: Once | INTRAMUSCULAR | Status: DC | PRN

## 2020-06-21 MED ORDER — FAMOTIDINE IN NACL 20-0.9 MG/50ML-% IV SOLN: 20.0000 mg | Freq: Once | INTRAVENOUS | Status: DC | PRN

## 2020-06-21 MED ORDER — METHYLPREDNISOLONE SODIUM SUCC 125 MG IJ SOLR: 125.0000 mg | Freq: Once | INTRAMUSCULAR | Status: DC | PRN

## 2020-06-21 MED ORDER — SODIUM CHLORIDE 0.9 % IV SOLN: Freq: Once | INTRAVENOUS | Status: AC

## 2020-06-21 MED ORDER — SODIUM CHLORIDE 0.9 % IV SOLN: INTRAVENOUS | Status: DC | PRN

## 2020-06-21 MED ORDER — ALBUTEROL SULFATE HFA 108 (90 BASE) MCG/ACT IN AERS: 2.0000 | INHALATION_SPRAY | Freq: Once | RESPIRATORY_TRACT | Status: DC | PRN

## 2020-06-21 NOTE — Progress Notes (Signed)
  Diagnosis: COVID-19  Physician: Dr. Joya Gaskins  Procedure: Covid Infusion Clinic Med: bamlanivimab\etesevimab infusion - Provided patient with bamlanimivab\etesevimab fact sheet for patients, parents and caregivers prior to infusion.  Complications: No immediate complications noted.  Discharge: Discharged home   Williams Che 06/21/2020

## 2020-06-21 NOTE — Discharge Instructions (Signed)

## 2020-06-21 NOTE — Telephone Encounter (Signed)
Called to Discuss with patient about Covid symptoms and the use of the monoclonal antibody infusion for those with mild to moderate Covid symptoms and at a high risk of hospitalization.     Pt appears to qualify for this infusion due to co-morbid conditions and/or a member of an at-risk group in accordance with the FDA Emergency Use Authorization.    Unable to reach pt as listed numbers don't work.  Mychart message sent

## 2020-06-21 NOTE — Telephone Encounter (Addendum)
Called to Discuss with patient about Covid symptoms and the use of the monoclonal antibody infusion for those with mild to moderate Covid symptoms and at a high risk of hospitalization.     Pt appears to qualify for this infusion due to co-morbid conditions and/or a member of an at-risk group in accordance with the FDA Emergency Use Authorization.   Pt stated his symptoms started last Wednesday on 06/14/2020. He stated he performed a home test which was negative, then performed another one on Monday, 06/19/2020 which was positive and tested again at [facility] today 06/21/20, with positive results. Pt c/o headache, congestion, being achy, and loss of smell/taste. Risk factors include HTN, OSA, DM, and obesity. Pt stated he could come today for an infusion. Directions given and appointment made for 3:30pm.

## 2020-06-21 NOTE — Progress Notes (Signed)
I connected by phone with Derrick Willis on 06/21/2020 at 2:53 PM to discuss the potential use of a new treatment for mild to moderate COVID-19 viral infection in non-hospitalized patients.  This patient is a 59 y.o. male that meets the FDA criteria for Emergency Use Authorization of COVID monoclonal antibody casirivimab/imdevimab or bamlanivimab/eteseviamb.  Has a (+) direct SARS-CoV-2 viral test result  Has mild or moderate COVID-19   Is NOT hospitalized due to COVID-19  Is within 10 days of symptom onset  Has at least one of the high risk factor(s) for progression to severe COVID-19 and/or hospitalization as defined in EUA.  Specific high risk criteria : BMI > 25   I have spoken and communicated the following to the patient or parent/caregiver regarding COVID monoclonal antibody treatment:  1. FDA has authorized the emergency use for the treatment of mild to moderate COVID-19 in adults and pediatric patients with positive results of direct SARS-CoV-2 viral testing who are 67 years of age and older weighing at least 40 kg, and who are at high risk for progressing to severe COVID-19 and/or hospitalization.  2. The significant known and potential risks and benefits of COVID monoclonal antibody, and the extent to which such potential risks and benefits are unknown.  3. Information on available alternative treatments and the risks and benefits of those alternatives, including clinical trials.  4. Patients treated with COVID monoclonal antibody should continue to self-isolate and use infection control measures (e.g., wear mask, isolate, social distance, avoid sharing personal items, clean and disinfect "high touch" surfaces, and frequent handwashing) according to CDC guidelines.   5. The patient or parent/caregiver has the option to accept or refuse COVID monoclonal antibody treatment.  After reviewing this information with the patient, the patient has agreed to receive one of the  available covid 19 monoclonal antibodies and will be provided an appropriate fact sheet prior to infusion. Fenton Foy, NP 06/21/2020 2:53 PM

## 2020-06-27 ENCOUNTER — Telehealth: Payer: Self-pay

## 2020-06-27 NOTE — Telephone Encounter (Signed)
Had virtual visit with Dr. Maudie Mercury on 10/26, she wrote prescription for cough. COVID POS. Patient was seen at [facility] on 10/27. Patient states that he has 3 pills (qty of #20 dispensed on 10/26)  benzonatate (TESSALON PERLES) 100 MG capsule [[id number]]   He feels like he needs a few more for his cough.  [facility] [place]

## 2020-06-28 DIAGNOSIS — G4733 Obstructive sleep apnea (adult) (pediatric): Secondary | ICD-10-CM | POA: Diagnosis not present

## 2020-06-28 MED ORDER — BENZONATATE 100 MG PO CAPS
100.0000 mg | ORAL_CAPSULE | Freq: Three times a day (TID) | ORAL | 0 refills | Status: DC | PRN
Start: 2020-06-28 — End: 2020-09-08

## 2020-06-28 NOTE — Telephone Encounter (Signed)
Patient advised and voiced understanding.  

## 2020-06-28 NOTE — Telephone Encounter (Signed)
OK, benzonatate eRx'd

## 2020-06-28 NOTE — Telephone Encounter (Signed)
Please advise, thanks.

## 2020-08-03 ENCOUNTER — Telehealth: Payer: Self-pay

## 2020-08-03 MED ORDER — METFORMIN HCL 500 MG PO TABS: ORAL_TABLET | ORAL | 0 refills | Status: DC

## 2020-08-03 NOTE — Telephone Encounter (Signed)
Patient has checked his blood sugar regularly and he went off Metformin last year he said.  He stated that blood sugar has been running high and would like to go back on Metformin.  I told him he needed to make an appt with Dr. Anitra Lauth, his last visit was in Jan 2021.  He agreed. Appt made on 08/24/20.    metFORMIN (GLUCOPHAGE) 500 MG tablet    [place], [place] [address]

## 2020-08-03 NOTE — Telephone Encounter (Signed)
OK, metformin 500 mg, 1 bid ordered today.

## 2020-08-03 NOTE — Telephone Encounter (Signed)
Pt was informed that he will most likely have to wait until appointment before any medication is prescribed. Pt states that his BS has been fasting 180-210 and 240 after a couple from eating. Pt was instructed to check BS once fasting and again 2 hours after eating, log the numbers, and bring to next appt. Please advise on rx. Thanks

## 2020-08-04 NOTE — Telephone Encounter (Signed)
Left detailed message advising refill sent, okay per DPR 

## 2020-08-16 ENCOUNTER — Other Ambulatory Visit: Payer: Self-pay

## 2020-08-16 ENCOUNTER — Telehealth: Payer: Self-pay | Admitting: Family Medicine

## 2020-08-16 MED ORDER — IRBESARTAN 75 MG PO TABS
75.0000 mg | ORAL_TABLET | Freq: Every day | ORAL | 0 refills | Status: DC
Start: 2020-08-16 — End: 2020-08-23

## 2020-08-16 NOTE — Telephone Encounter (Signed)
Patient requesting refill of Irbesartan. States he is has been out x 2 days and had called the pharmacy last Friday. Please send to same [facility] in [place].

## 2020-08-16 NOTE — Telephone Encounter (Signed)
Rx sent to pharmacy to last until appt

## 2020-08-23 ENCOUNTER — Telehealth: Payer: Self-pay | Admitting: Family Medicine

## 2020-08-23 ENCOUNTER — Other Ambulatory Visit: Payer: Self-pay

## 2020-08-23 MED ORDER — IRBESARTAN 75 MG PO TABS
75.0000 mg | ORAL_TABLET | Freq: Every day | ORAL | 0 refills | Status: DC
Start: 2020-08-23 — End: 2020-09-11

## 2020-08-23 NOTE — Telephone Encounter (Signed)
Patient requesting refill of ibesartan, has one day left. Postponed his appt from 08/24/20 to 09/08/20 due to cold symptoms. Patient declined video visit for current symptoms but will call back if needed.

## 2020-08-23 NOTE — Telephone Encounter (Signed)
RX sent for 14 day supply, patient notified.

## 2020-08-24 ENCOUNTER — Ambulatory Visit: Payer: BC Managed Care – PPO | Admitting: Family Medicine

## 2020-09-07 ENCOUNTER — Other Ambulatory Visit: Payer: Self-pay

## 2020-09-07 NOTE — Progress Notes (Unsigned)
OFFICE VISIT  09/08/2020  CC:  Chief Complaint  Patient presents with  . Follow-up    DM, has not been taking Metformin due to side effects causing him. C/o diarrhea and nausea   HPI:    Patient is a 60 y.o. Caucasian male who presents for elevated glucoses and for CPE. I last saw him for chronic medical problems f/u 1 yr ago. A/P as of that visit: "1) DM 2: much improved control. Continue improving diet and exercise. No meds.  2) HTN: control is borderline.  He declined to increase med today, wants to see if still has OSA based on sleep study yesterday, and if he gets on CPAP and bp's don't improve then we'll increase med. Lytes/cr good 02/2019.  Repeat next f/u 6 mo.  3) HLD: he repeatedly declines rx chol med. He's taking herbal called cholestoff. Continue TLC. Plan recheck lipids and hepatic panel next f/u 6 mo.  4) OSA: off CPAP for years. Got sleep study again yesterday--awaiting results. Suspect he'll get back on CPAP."  INTERIM HX: Got away from good diet and exercise habits. Covid inf 05/2020. Around 06/2020 was feeling fatigued still and "not right".  Started checking gluc's again and noted it was around 200s so we got him back on metformin. Metformin causes persistent nausa and diarrhea.   Stopped it 2 wks ago and all nausea and diarrhea resolved. Glucoses lately still around 170-190 range fasting, no postprandial checks. No excessive thirst.  Diet and exercise improving the last 6 wks or so. DM: A1c 1 yr ago OFF any diabetes meds was 5.6%. Feet->starting to get some tingling in distal half of both feet mainly in evenings.  HTN:  Home bp's consistently 120s/70s, P 40s-50s.  OSA: he did get sleep study after last visit 1 yr ago and it showed OSA and CPAP was recommended and he is on this now and feels like this helps a lot.  HLD: continues to take nonrx med called cholestoff.   Past Medical History:  Diagnosis Date  . Asthma    "grew out of it", esp  after quitting smoking arond age 60 yrs old  . Atypical chest pain 06/12/2014  . Blood in stool   . Carpal tunnel syndrome    bilat, L>>R (Dr. Vertell Limber).  Hand symptoms prompted referral to rheum--lab w/u NEG.  Wrist splinting x 6 wks advised, not much help so low dose prednisone rx'd, then pt had to get steroid injection into L carpal tunnel 12/2016.  Marland Kitchen Cervical myelopathy [facility])    Surgical fusion--Dr Vertell Limber (cerv spinal stenosis).  Stable as of 08/2019 neurosurg f/u.  Marland Kitchen Chronic low back pain   . Diabetes mellitus without complication ([place])    No diab retinop as of 12/2014  . Gout    Knee and toe--allopurinol since approx 2012 and has had no flares since being on this med (1/2 of 300 mg tab qd)  . Hyperkalemia 2015/2016   low K diet; cut lisinopril from 10mg  to 5mg  qd 11/2014.  Marland Kitchen Hyperlipidemia    med x 1 trial=adverse side effects so he stopped it.  LDL 130s 08/2014, then dx'd with DM 2 shortly after, pt refused another trial of statin 05/2017 and 2020.  Marland Kitchen Hypertension   . OSA (obstructive sleep apnea)    Remote past: had cpap but didn't use much .  REPEAT HOME SLEEP STUDY 09/16/19-> severe OSA, CPAP recommended.  . Paresthesia of both hands    Dr. Vertell Limber: MRI 01/2018: no acute  abnormality.  Stable-to-improved chronic spine/spinal cord changes, fusion good.  Marland Kitchen Umbilical hernia [id number]    Past Surgical History:  Procedure Laterality Date  . ANTERIOR CERVICAL DECOMP/DISCECTOMY FUSION N/A 03/07/2017   Procedure: Cervical four-five, Cervical five-six, Cerivcal six- seven Anterior cervical decompression/discectomy/fusion WITH CERVICAL FIVE COREPECTOMY;  Surgeon: Erline Levine, MD;  Location: [facility];  Service: Neurosurgery;  Laterality: N/A;  C4-5 C5-6 C6-7 Anterior cervical decompression/discectomy/fusion  . BACK SURGERY  12/05/2016   Lumbar, Dr. Vertell Limber at [facility] center  . COLONOSCOPY W/ POLYPECTOMY  10/13/14   Hyperplastic; recall 10 yrs  . Sleep study  09/16/2019   Home sleep study--severe  OSA->CPAP recommended  . VASECTOMY    . WISDOM TOOTH EXTRACTION     Social History   Socioeconomic History  . Marital status: Legally Separated    Spouse name: Not on file  . Number of children: Not on file  . Years of education: Not on file  . Highest education level: Not on file  Occupational History  . Not on file  Tobacco Use  . Smoking status: Former Smoker    Packs/day: 1.50    Years: 12.00    Pack years: 18.00    Types: Cigars    Quit date: 05/31/1990    Years since quitting: 30.2  . Smokeless tobacco: Never Used  . Tobacco comment: Quit smoking Cigars 10/2015  Vaping Use  . Vaping Use: Never used  Substance and Sexual Activity  . Alcohol use: Yes    Alcohol/week: 1.0 - 2.0 standard drink    Types: 1 - 2 Glasses of wine per week    Comment: 2-3 x weekly  . Drug use: No  . Sexual activity: Not on file  Other Topics Concern  . Not on file  Social History Narrative   Divorced.  Two children, both young adults (one son plans to go to med school).   Occupation: Grading contractor-also a Psychologist, sport and exercise.   Education: 2 years of college.   Orig from [place], still lives there.   Tob 30 pack-yr hx, quit around age 50.   Social drinker.     No regular exercise.  Eating habits poor.   Social Determinants of Health   Financial Resource Strain: Not on file  Food Insecurity: Not on file  Transportation Needs: Not on file  Physical Activity: Not on file  Stress: Not on file  Social Connections: Not on file   Family History  Problem Relation Age of Onset  . Arthritis Mother   . Arthritis Father   . Hyperlipidemia Father   . Hypertension Father   . Colon polyps Neg Hx   . Esophageal cancer Neg Hx   . Rectal cancer Neg Hx   . Stomach cancer Neg Hx     Outpatient Medications Prior to Visit  Medication Sig Dispense Refill  . allopurinol (ZYLOPRIM) 300 MG tablet Take 1 tablet (300 mg total) by mouth daily. 30 tablet 1  . aspirin EC 81 MG tablet Take 81 mg by mouth daily.     . irbesartan (AVAPRO) 75 MG tablet Take 1 tablet (75 mg total) by mouth daily. 14 tablet 0  . Plant Sterols and Stanols (CHOLESTOFF PO) Take by mouth daily.    Vladimir Faster Glycol-Propyl Glycol 0.4-0.3 % SOLN Place 1-2 drops into both eyes 4 (four) times daily as needed (for dry eyes.).    Marland Kitchen albuterol (PROAIR HFA) 108 (90 Base) MCG/ACT inhaler Inhale 2 puffs into the lungs every 6 (six) hours as  needed for wheezing or shortness of breath. (Patient not taking: Reported on 09/08/2020) 1 each 0  . benzonatate (TESSALON PERLES) 100 MG capsule Take 1 capsule (100 mg total) by mouth 3 (three) times daily as needed. (Patient not taking: Reported on 09/08/2020) 20 capsule 0  . gabapentin (NEURONTIN) 300 MG capsule in the morning and at bedtime. (Patient not taking: Reported on 09/08/2020)    . metFORMIN (GLUCOPHAGE) 500 MG tablet 1 tab po bid (Patient not taking: Reported on 09/08/2020) 60 tablet 0   No facility-administered medications prior to visit.    Allergies  Allergen Reactions  . Prednisone Other (See Comments)    Headaches, muscle aches, fatigue  . Lisinopril Cough    ROS Review of Systems  Constitutional: Negative for appetite change, chills, fatigue and fever.  HENT: Negative for congestion, dental problem, ear pain and sore throat.   Eyes: Negative for discharge, redness and visual disturbance.  Respiratory: Negative for cough, chest tightness, shortness of breath and wheezing.   Cardiovascular: Negative for chest pain, palpitations and leg swelling.  Gastrointestinal: Negative for abdominal pain, blood in stool, diarrhea, nausea and vomiting.  Genitourinary: Negative for difficulty urinating, dysuria, flank pain, frequency, hematuria and urgency.  Musculoskeletal: Negative for arthralgias, back pain, joint swelling, myalgias and neck stiffness.  Skin: Negative for pallor and rash.  Neurological: Negative for dizziness, speech difficulty, weakness and headaches.  Hematological:  Negative for adenopathy. Does not bruise/bleed easily.  Psychiatric/Behavioral: Negative for confusion and sleep disturbance. The patient is not nervous/anxious.     PE: Vitals with BMI 09/08/2020 06/21/2020 06/21/2020  Height 5\' 5"  - -  Weight 194 lbs 10 oz - -  BMI 17.61 - -  Systolic 607 371 062  Diastolic 81 76 96  Pulse 59 84 59    Gen: Alert, well appearing.  Patient is oriented to person, place, time, and situation. AFFECT: pleasant, lucid thought and speech. ENT: Ears: EACs clear, normal epithelium.  TMs with good light reflex and landmarks bilaterally.  Eyes: no injection, icteris, swelling, or exudate.  EOMI, PERRLA. Nose: no drainage or turbinate edema/swelling.  No injection or focal lesion.  Mouth: lips without lesion/swelling.  Oral mucosa pink and moist.  Dentition intact and without obvious caries or gingival swelling.  Oropharynx without erythema, exudate, or swelling.  Neck: supple/nontender.  No LAD, mass, or TM.  Carotid pulses 2+ bilaterally, without bruits. CV: RRR, no m/r/g.   LUNGS: CTA bilat, nonlabored resps, good aeration in all lung fields. ABD: soft, NT, ND, BS normal.  No hepatospenomegaly or mass.  No bruits. EXT: no clubbing, cyanosis, or edema.  Musculoskeletal: no joint swelling, erythema, warmth, or tenderness.  ROM of all joints intact. Skin - no sores or suspicious lesions or rashes or color changes    LABS:  Lab Results  Component Value Date   TSH 1.40 03/18/2019   Lab Results  Component Value Date   WBC 8.0 03/18/2019   HGB 16.9 03/18/2019   HCT 51.2 03/18/2019   MCV 90.6 03/18/2019   PLT 205.0 03/18/2019   Lab Results  Component Value Date   CREATININE 1.03 03/18/2019   BUN 22 03/18/2019   NA 143 03/18/2019   K 4.7 03/18/2019   CL 107 03/18/2019   CO2 28 03/18/2019   Lab Results  Component Value Date   ALT 18 03/18/2019   AST 16 03/18/2019   ALKPHOS 77 03/18/2019   BILITOT 0.6 03/18/2019   Lab Results  Component Value  Date  CHOL 221 (H) 06/21/2019   Lab Results  Component Value Date   HDL 55.50 06/21/2019   Lab Results  Component Value Date   LDLCALC 144 (H) 06/21/2019   Lab Results  Component Value Date   TRIG 106.0 06/21/2019   Lab Results  Component Value Date   CHOLHDL 4 06/21/2019   Lab Results  Component Value Date   PSA 1.35 03/18/2019   PSA 1.14 11/26/2017   PSA 1.04 09/26/2016   Lab Results  Component Value Date   HGBA1C 5.6 09/16/2019   HGBA1C 5.6 09/16/2019   HGBA1C 5.6 (A) 09/16/2019   HGBA1C 5.6 09/16/2019    IMPRESSION AND PLAN:  1) DM 2, poor control now on diet alone. Working on [employer] and activity already. Intol of metformin (GI). Start actos 15mg , 1 qd x 15d then inc to 2 qd. Hba1c, microalb/cr, lytes/cr today. Feet exam normal today but sx's of mild DPN.  2) HTN: well controlled on irbesartan 75mg  qd. Lytes/cr today.  3) HLD; declines statins. Cont otc "cholesteroff". FLP today.  4) Health maintenance exam: Reviewed age and gender appropriate health maintenance issues (prudent diet, regular exercise, health risks of tobacco and excessive alcohol, use of seatbelts, fire alarms in home, use of sunscreen).  Also reviewed age and gender appropriate health screening as well as vaccine recommendations. Vaccines: Flu->pt declines.  Covid->declined.  Shingrix->pt declines.  Tdap and pneumovax UTD. Labs: fasting HP + a1c and urine microalb/cr and PSA. Prostate ca screening: PSA today. Colon ca screening:  Next colonoscopy due 2026.  An After Visit Summary was printed and given to the patient.  FOLLOW UP: Return in about 4 weeks (around 10/06/2020) for f/u DM.  Signed:  Crissie Sickles, MD           09/08/2020

## 2020-09-08 ENCOUNTER — Encounter: Payer: Self-pay | Admitting: Family Medicine

## 2020-09-08 ENCOUNTER — Ambulatory Visit (INDEPENDENT_AMBULATORY_CARE_PROVIDER_SITE_OTHER): Payer: Managed Care, Other (non HMO) | Admitting: Family Medicine

## 2020-09-08 VITALS — BP 128/81 | HR 59 | Temp 98.2°F | Resp 16 | Ht 65.0 in | Wt 194.6 lb

## 2020-09-08 DIAGNOSIS — E7800 Pure hypercholesterolemia, unspecified: Secondary | ICD-10-CM

## 2020-09-08 DIAGNOSIS — Z0000 Encounter for general adult medical examination without abnormal findings: Secondary | ICD-10-CM

## 2020-09-08 DIAGNOSIS — E118 Type 2 diabetes mellitus with unspecified complications: Secondary | ICD-10-CM

## 2020-09-08 DIAGNOSIS — I10 Essential (primary) hypertension: Secondary | ICD-10-CM

## 2020-09-08 MED ORDER — PIOGLITAZONE HCL 15 MG PO TABS: ORAL_TABLET | ORAL | 0 refills | Status: DC

## 2020-09-09 LAB — COMPREHENSIVE METABOLIC PANEL
AG Ratio: 1.6 (calc) (ref 1.0–2.5)
ALT: 25 U/L (ref 9–46)
AST: 16 U/L (ref 10–35)
Albumin: 4.1 g/dL (ref 3.6–5.1)
Alkaline phosphatase (APISO): 67 U/L (ref 35–144)
BUN: 17 mg/dL (ref 7–25)
CO2: 27 mmol/L (ref 20–32)
Calcium: 9.4 mg/dL (ref 8.6–10.3)
Chloride: 108 mmol/L (ref 98–110)
Creat: 0.92 mg/dL (ref 0.70–1.25)
Globulin: 2.5 g/dL (calc) (ref 1.9–3.7)
Glucose, Bld: 174 mg/dL — ABNORMAL HIGH (ref 65–99)
Potassium: 4.9 mmol/L (ref 3.5–5.3)
Sodium: 142 mmol/L (ref 135–146)
Total Bilirubin: 0.6 mg/dL (ref 0.2–1.2)
Total Protein: 6.6 g/dL (ref 6.1–8.1)

## 2020-09-09 LAB — MICROALBUMIN / CREATININE URINE RATIO
Creatinine, Urine: 195 mg/dL (ref 20–320)
Microalb Creat Ratio: 6 mcg/mg creat (ref <30)
Microalb, Ur: 1.2 mg/dL

## 2020-09-09 LAB — CBC WITH DIFFERENTIAL/PLATELET
Absolute Monocytes: 454 cells/uL (ref 200–950)
Basophils Absolute: 108 cells/uL (ref 0–200)
Basophils Relative: 1.5 %
Eosinophils Absolute: 144 cells/uL (ref 15–500)
Eosinophils Relative: 2 %
HCT: 46.6 % (ref 38.5–50.0)
Hemoglobin: 16 g/dL (ref 13.2–17.1)
Lymphs Abs: 1606 cells/uL (ref 850–3900)
MCH: 29.9 pg (ref 27.0–33.0)
MCHC: 34.3 g/dL (ref 32.0–36.0)
MCV: 86.9 fL (ref 80.0–100.0)
MPV: 10.3 fL (ref 7.5–12.5)
Monocytes Relative: 6.3 %
Neutro Abs: 4889 cells/uL (ref 1500–7800)
Neutrophils Relative %: 67.9 %
Platelets: 208 10*3/uL (ref 140–400)
RBC: 5.36 10*6/uL (ref 4.20–5.80)
RDW: 13.5 % (ref 11.0–15.0)
Total Lymphocyte: 22.3 %
WBC: 7.2 10*3/uL (ref 3.8–10.8)

## 2020-09-09 LAB — LIPID PANEL
Cholesterol: 195 mg/dL (ref <200)
HDL: 54 mg/dL (ref >=40)
LDL Cholesterol (Calc): 119 mg/dL (calc) — ABNORMAL HIGH
Non-HDL Cholesterol (Calc): 141 mg/dL (calc) — ABNORMAL HIGH (ref <130)
Total CHOL/HDL Ratio: 3.6 (calc) (ref <5.0)
Triglycerides: 111 mg/dL (ref <150)

## 2020-09-09 LAB — HEMOGLOBIN A1C
Hgb A1c MFr Bld: 7.2 % of total Hgb — ABNORMAL HIGH (ref <5.7)
Mean Plasma Glucose: 160 mg/dL
eAG (mmol/L): 8.9 mmol/L

## 2020-09-09 LAB — TSH: TSH: 0.99 mIU/L (ref 0.40–4.50)

## 2020-09-11 ENCOUNTER — Other Ambulatory Visit: Payer: Self-pay

## 2020-09-11 DIAGNOSIS — I10 Essential (primary) hypertension: Secondary | ICD-10-CM

## 2020-09-11 MED ORDER — IRBESARTAN 75 MG PO TABS: 75.0000 mg | ORAL_TABLET | Freq: Every day | ORAL | 0 refills | Status: DC

## 2020-10-09 ENCOUNTER — Other Ambulatory Visit: Payer: Self-pay | Admitting: Family Medicine

## 2020-10-09 ENCOUNTER — Telehealth: Payer: Self-pay | Admitting: Family Medicine

## 2020-10-09 DIAGNOSIS — I10 Essential (primary) hypertension: Secondary | ICD-10-CM | POA: Diagnosis not present

## 2020-10-09 DIAGNOSIS — M4802 Spinal stenosis, cervical region: Secondary | ICD-10-CM | POA: Diagnosis not present

## 2020-10-09 DIAGNOSIS — M542 Cervicalgia: Secondary | ICD-10-CM | POA: Diagnosis not present

## 2020-10-09 DIAGNOSIS — G959 Disease of spinal cord, unspecified: Secondary | ICD-10-CM | POA: Diagnosis not present

## 2020-10-09 DIAGNOSIS — Z6833 Body mass index (BMI) 33.0-33.9, adult: Secondary | ICD-10-CM | POA: Diagnosis not present

## 2020-10-09 MED ORDER — IRBESARTAN 75 MG PO TABS: 75.0000 mg | ORAL_TABLET | Freq: Every day | ORAL | 5 refills | Status: DC

## 2020-10-09 NOTE — Telephone Encounter (Signed)
Patient requesting refills of Irbesartan and Pioglitazone. Patient has one day left. He would like 90 day supplies. Please send to same [facility] in [place].

## 2020-10-09 NOTE — Telephone Encounter (Signed)
Rx sent 

## 2020-10-12 ENCOUNTER — Other Ambulatory Visit: Payer: Self-pay

## 2020-10-13 ENCOUNTER — Ambulatory Visit (INDEPENDENT_AMBULATORY_CARE_PROVIDER_SITE_OTHER): Payer: Managed Care, Other (non HMO) | Admitting: Family Medicine

## 2020-10-13 ENCOUNTER — Encounter: Payer: Self-pay | Admitting: Family Medicine

## 2020-10-13 VITALS — BP 107/67 | HR 54 | Temp 98.1°F | Resp 16 | Ht 65.0 in | Wt 200.8 lb

## 2020-10-13 DIAGNOSIS — F330 Major depressive disorder, recurrent, mild: Secondary | ICD-10-CM

## 2020-10-13 DIAGNOSIS — R69 Illness, unspecified: Secondary | ICD-10-CM | POA: Diagnosis not present

## 2020-10-13 DIAGNOSIS — E119 Type 2 diabetes mellitus without complications: Secondary | ICD-10-CM

## 2020-10-13 MED ORDER — PIOGLITAZONE HCL 45 MG PO TABS: 45.0000 mg | ORAL_TABLET | Freq: Every day | ORAL | 2 refills | Status: DC

## 2020-10-13 MED ORDER — CITALOPRAM HYDROBROMIDE 20 MG PO TABS: 20.0000 mg | ORAL_TABLET | Freq: Every day | ORAL | 1 refills | Status: DC

## 2020-10-13 NOTE — Progress Notes (Signed)
OFFICE VISIT  10/13/2020  CC:  Chief Complaint  Patient presents with  . Follow-up    DM, pt is fasting   HPI:    Patient is a 60 y.o. Caucasian male who presents for 1 mo f/u DM. A/P as of last visit: "1) DM 2, poor control now on diet alone. Working on [employer] and activity already. Intol of metformin (GI). Start actos 15mg , 1 qd x 15d then inc to 2 qd. Hba1c, microalb/cr, lytes/cr today. Feet exam normal today but sx's of mild DPN.  2) HTN: well controlled on irbesartan 75mg  qd. Lytes/cr today.  3) HLD; declines statins. Cont otc "cholesteroff". FLP today.  4) Health maintenance exam: Reviewed age and gender appropriate health maintenance issues (prudent diet, regular exercise, health risks of tobacco and excessive alcohol, use of seatbelts, fire alarms in home, use of sunscreen).  Also reviewed age and gender appropriate health screening as well as vaccine recommendations. Vaccines: Flu->pt declines.  Covid->declined.  Shingrix->pt declines.  Tdap and pneumovax UTD. Labs: fasting HP + a1c and urine microalb/cr and PSA. Prostate ca screening: PSA today. Colon ca screening:  Next colonoscopy due 2026."  INTERIM HX: Started actos last visit. Tolerating med, 30mg  actos qd. Glucs still 170s avg fasting, no other gluc checks. Says diet improved a little.  Still not exercising much, not very motivated.  Says he does have some depression, particularly in winter. 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 Some hopelessness and anhedonia, unmotivated.  Wants to sleep all the time.  No SI or HI. Lives alone, kids are out of house.  Confides in his mother, who lives near him.   Drinks 2-3 bourbon drinks qhs a few nights per week, a bit more of an issue last few months and he recognized this and curtailed it.  Has never been a heavy drinker. Tried antidep approx 20 yrs ago, felt stigma attached so didn't really take much.  PHQ-9 today, score 13, "very  difficult".  ROS: no fevers, no CP, no SOB, no wheezing, no cough, no dizziness, no HAs, no rashes, no melena/hematochezia.  No polyuria or polydipsia.  No focal weakness, paresthesias, or tremors.  No acute vision or hearing abnormalities. No n/v/d or abd pain.  No palpitations.    Has chronic neck pain with UE paresthesias, he's working with neurosurgeon on this problem, awaiting neck MRI.  Past Medical History:  Diagnosis Date  . Asthma    "grew out of it", esp after quitting smoking arond age 61 yrs old  . Atypical chest pain 06/12/2014  . Blood in stool   . Carpal tunnel syndrome    bilat, L>>R (Dr. Vertell Limber).  Hand symptoms prompted referral to rheum--lab w/u NEG.  Wrist splinting x 6 wks advised, not much help so low dose prednisone rx'd, then pt had to get steroid injection into L carpal tunnel 12/2016.  Marland Kitchen Cervical myelopathy [facility])    Surgical fusion--Dr Vertell Limber (cerv spinal stenosis).  Stable as of 08/2019 neurosurg f/u.  Marland Kitchen Chronic low back pain   . COVID-19 virus infection 05/2020  . Diabetes mellitus without complication ([place])    No diab retinop as of 12/2014  . Gout    Knee and toe--allopurinol since approx 2012 and has had no flares since being on this med (1/2 of 300 mg tab qd)  . Hyperkalemia 2015/2016   low K diet; cut lisinopril from 10mg  to 5mg  qd 11/2014.  Marland Kitchen Hyperlipidemia    med x 1  trial=adverse side effects so he stopped it.  LDL 130s 08/2014, then dx'd with DM 2 shortly after, pt refused another trial of statin 05/2017 and 2020.  Marland Kitchen Hypertension   . OSA (obstructive sleep apnea)    Remote past: had cpap but didn't use much .  REPEAT HOME SLEEP STUDY 09/16/19-> severe OSA, CPAP recommended.  . Paresthesia of both hands    Dr. Vertell Limber: MRI 01/2018: no acute abnormality.  Stable-to-improved chronic spine/spinal cord changes, fusion good.  Marland Kitchen Umbilical hernia [id number]    Past Surgical History:  Procedure Laterality Date  . ANTERIOR CERVICAL DECOMP/DISCECTOMY FUSION N/A 03/07/2017    Procedure: Cervical four-five, Cervical five-six, Cerivcal six- seven Anterior cervical decompression/discectomy/fusion WITH CERVICAL FIVE COREPECTOMY;  Surgeon: Erline Levine, MD;  Location: [facility];  Service: Neurosurgery;  Laterality: N/A;  C4-5 C5-6 C6-7 Anterior cervical decompression/discectomy/fusion  . BACK SURGERY  12/05/2016   Lumbar, Dr. Vertell Limber at [facility] center  . COLONOSCOPY W/ POLYPECTOMY  10/13/14   Hyperplastic; recall 10 yrs  . Sleep study  09/16/2019   Home sleep study--severe OSA->CPAP recommended  . VASECTOMY    . WISDOM TOOTH EXTRACTION      Outpatient Medications Prior to Visit  Medication Sig Dispense Refill  . allopurinol (ZYLOPRIM) 300 MG tablet Take 1 tablet (300 mg total) by mouth daily. 30 tablet 1  . aspirin EC 81 MG tablet Take 81 mg by mouth daily.    . irbesartan (AVAPRO) 75 MG tablet Take 1 tablet (75 mg total) by mouth daily. 30 tablet 5  . Plant Sterols and Stanols (CHOLESTOFF PO) Take by mouth daily.    Vladimir Faster Glycol-Propyl Glycol 0.4-0.3 % SOLN Place 1-2 drops into both eyes 4 (four) times daily as needed (for dry eyes.).    Marland Kitchen pioglitazone (ACTOS) 15 MG tablet Take 2 tablets by mouth once daily 60 tablet 5   No facility-administered medications prior to visit.    Allergies  Allergen Reactions  . Metformin And Related Diarrhea    Nausea and diarrhea  . Prednisone Other (See Comments)    Headaches, muscle aches, fatigue  . Lisinopril Cough    ROS As per HPI  PE: Vitals with BMI 10/13/2020 09/08/2020 06/21/2020  Height 5\' 5"  5\' 5"  -  Weight 200 lbs 13 oz 194 lbs 10 oz -  BMI 41.93 79.02 -  Systolic 409 735 329  Diastolic 67 81 76  Pulse 54 59 84   Gen: Alert, well appearing.  Patient is oriented to person, place, time, and situation. AFFECT: pleasant, lucid thought and speech. No further exam today.  LABS:  Lab Results  Component Value Date   TSH 0.99 09/08/2020   Lab Results  Component Value Date   WBC 7.2  09/08/2020   HGB 16.0 09/08/2020   HCT 46.6 09/08/2020   MCV 86.9 09/08/2020   PLT 208 09/08/2020   Lab Results  Component Value Date   CREATININE 0.92 09/08/2020   BUN 17 09/08/2020   NA 142 09/08/2020   K 4.9 09/08/2020   CL 108 09/08/2020   CO2 27 09/08/2020   Lab Results  Component Value Date   ALT 25 09/08/2020   AST 16 09/08/2020   ALKPHOS 77 03/18/2019   BILITOT 0.6 09/08/2020   Lab Results  Component Value Date   CHOL 195 09/08/2020   Lab Results  Component Value Date   HDL 54 09/08/2020   Lab Results  Component Value Date   LDLCALC 119 (H) 09/08/2020  Lab Results  Component Value Date   TRIG 111 09/08/2020   Lab Results  Component Value Date   CHOLHDL 3.6 09/08/2020   Lab Results  Component Value Date   PSA 1.35 03/18/2019   PSA 1.14 11/26/2017   PSA 1.04 09/26/2016   Lab Results  Component Value Date   HGBA1C 7.2 (H) 09/08/2020   IMPRESSION AND PLAN:  1) DM, control not ideal. Needs to inc exercise and work on diet more, and I'll also inc actos to 45mg  qd.  2) MDD, recurrent. No signif treatment in the past. Start citalopram 20mg  qd.  Therapeutic expectations and side effect profile of medication discussed today.  Patient's questions answered.  3) Chronic neck pain with radiculopathy: he's working with his neurosurgeon on this, awaiting rpt MRI neck.  An After Visit Summary was printed and given to the patient.  FOLLOW UP: Return in about 4 weeks (around 11/10/2020) for f/u dep and DM.  Signed:  Crissie Sickles, MD           10/13/2020

## 2020-10-25 ENCOUNTER — Telehealth: Payer: Self-pay | Admitting: Family Medicine

## 2020-10-25 NOTE — Telephone Encounter (Signed)
Noted  

## 2020-10-25 NOTE — Telephone Encounter (Signed)
FYI  Please see below

## 2020-10-25 NOTE — Telephone Encounter (Signed)
Patient states he cannot take citalopram due to heart racing int he morning and fatigue in afternoon. His last dose was 10/21/20. He states he is feeling better now and does not want another RX at this time. Will discuss at his upcoming appt, 11/10/20.

## 2020-11-09 ENCOUNTER — Other Ambulatory Visit: Payer: Self-pay

## 2020-11-10 ENCOUNTER — Ambulatory Visit (INDEPENDENT_AMBULATORY_CARE_PROVIDER_SITE_OTHER): Payer: 59 | Admitting: Family Medicine

## 2020-11-10 ENCOUNTER — Encounter: Payer: Self-pay | Admitting: Family Medicine

## 2020-11-10 VITALS — BP 113/67 | HR 63 | Temp 98.1°F | Resp 16 | Ht 65.0 in | Wt 202.2 lb

## 2020-11-10 DIAGNOSIS — F338 Other recurrent depressive disorders: Secondary | ICD-10-CM

## 2020-11-10 DIAGNOSIS — R69 Illness, unspecified: Secondary | ICD-10-CM | POA: Diagnosis not present

## 2020-11-10 DIAGNOSIS — E119 Type 2 diabetes mellitus without complications: Secondary | ICD-10-CM

## 2020-11-10 NOTE — Patient Instructions (Signed)
Ask your insurer which of the following meds is "preferred" on their formulary: Farxiga, Invokana, or Jardiance.

## 2020-11-10 NOTE — Progress Notes (Signed)
OFFICE VISIT  11/10/2020  CC: f/u dm and depression  HPI:    Patient is a 60 y.o. Caucasian male who presents for 1 mo f/u DM 2 and depression. A/P as of last visit: "1) DM, control not ideal. Needs to inc exercise and work on diet more, and I'll also inc actos to 45mg  qd.  2) MDD, recurrent. No signif treatment in the past. Start citalopram 20mg  qd.  Therapeutic expectations and side effect profile of medication discussed today.  Patient's questions answered.  3) Chronic neck pain with radiculopathy: he's working with his neurosurgeon on this, awaiting rpt MRI neck."  INTERIM HX: Doing fairly well. Diet is good/getting better and activity level getting better yet glucoses still only getting as low as 150.  Highest 210 in the last couple weeks.  Pt stopped cital b/c it made him have racing heart in mornings and fatigue in afternoons.  Felt better getting off the med and did not want to start diff med. Says motivation to do things still down---as per usual in winter, but as spring comes on he is hopeful this will clear.  Denies feeling sad, down/depressed.  Some anhedonia.      Past Medical History:  Diagnosis Date  . Asthma    "grew out of it", esp after quitting smoking arond age 84 yrs old  . Atypical chest pain 06/12/2014  . Carpal tunnel syndrome    bilat, L>>R (Dr. Vertell Limber).  Hand symptoms prompted referral to rheum--lab w/u NEG.  Wrist splinting x 6 wks advised, not much help so low dose prednisone rx'd, then pt had to get steroid injection into L carpal tunnel 12/2016.  Marland Kitchen Cervical myelopathy [facility])    Surgical fusion--Dr Vertell Limber (cerv spinal stenosis).  Stable as of 08/2019 neurosurg f/u.  Marland Kitchen Chronic low back pain   . COVID-19 virus infection 05/2020  . Diabetes mellitus without complication ([place])    No diab retinop as of 12/2014  . Gout    Knee and toe--allopurinol since approx 2012 and has had no flares since being on this med (1/2 of 300 mg tab qd)  . History of  hyperkalemia 2015/2016   low K diet; cut lisinopril from 10mg  to 5mg  qd 11/2014.  Marland Kitchen Hyperlipidemia    med x 1 trial=adverse side effects so he stopped it.  LDL 130s 08/2014, then dx'd with DM 2 shortly after, pt refused another trial of statin 05/2017 and 2020.  Marland Kitchen Hypertension   . MDD (major depressive disorder), recurrent episode ([place])    initiated tx 09/2020  . OSA (obstructive sleep apnea)    Remote past: had cpap but didn't use much .  REPEAT HOME SLEEP STUDY 09/16/19-> severe OSA, pt got on CPAP after this.  . Paresthesia of both hands    Dr. Vertell Limber: MRI 01/2018: no acute abnormality.  Stable-to-improved chronic spine/spinal cord changes, fusion good.  Marland Kitchen Umbilical hernia [id number]    Past Surgical History:  Procedure Laterality Date  . ANTERIOR CERVICAL DECOMP/DISCECTOMY FUSION N/A 03/07/2017   Procedure: Cervical four-five, Cervical five-six, Cerivcal six- seven Anterior cervical decompression/discectomy/fusion WITH CERVICAL FIVE COREPECTOMY;  Surgeon: Erline Levine, MD;  Location: [place];  Service: Neurosurgery;  Laterality: N/A;  C4-5 C5-6 C6-7 Anterior cervical decompression/discectomy/fusion  . BACK SURGERY  12/05/2016   Lumbar, Dr. Vertell Limber at [facility] surgical center  . COLONOSCOPY W/ POLYPECTOMY  10/13/14   Hyperplastic; recall 10 yrs  . Sleep study  09/16/2019   Home sleep study--severe OSA->CPAP recommended  . VASECTOMY    .  WISDOM TOOTH EXTRACTION      Outpatient Medications Prior to Visit  Medication Sig Dispense Refill  . allopurinol (ZYLOPRIM) 300 MG tablet Take 1 tablet (300 mg total) by mouth daily. 30 tablet 1  . aspirin EC 81 MG tablet Take 81 mg by mouth daily.    . irbesartan (AVAPRO) 75 MG tablet Take 1 tablet (75 mg total) by mouth daily. 30 tablet 5  . pioglitazone (ACTOS) 45 MG tablet Take 1 tablet (45 mg total) by mouth daily. 30 tablet 2  . Plant Sterols and Stanols (CHOLESTOFF PO) Take by mouth daily.    Vladimir Faster Glycol-Propyl Glycol 0.4-0.3 % SOLN Place 1-2  drops into both eyes 4 (four) times daily as needed (for dry eyes.).    Marland Kitchen citalopram (CELEXA) 20 MG tablet Take 1 tablet (20 mg total) by mouth daily. (Patient not taking: Reported on 11/10/2020) 30 tablet 1   No facility-administered medications prior to visit.    Allergies  Allergen Reactions  . Metformin And Related Diarrhea    Nausea and diarrhea  . Prednisone Other (See Comments)    Headaches, muscle aches, fatigue  . Lisinopril Cough    ROS As per HPI  PE: Vitals with BMI 11/10/2020 10/13/2020 09/08/2020  Height 5\' 5"  5\' 5"  5\' 5"   Weight 202 lbs 3 oz 200 lbs 13 oz 194 lbs 10 oz  BMI 33.65 93.81 01.75  Systolic 102 585 277  Diastolic 67 67 81  Pulse 63 54 59     Gen: Alert, well appearing.  Patient is oriented to person, place, time, and situation. AFFECT: pleasant, lucid thought and speech. No further exam today.  LABS:    Chemistry      Component Value Date/Time   NA 142 09/08/2020 0849   K 4.9 09/08/2020 0849   CL 108 09/08/2020 0849   CO2 27 09/08/2020 0849   BUN 17 09/08/2020 0849   CREATININE 0.92 09/08/2020 0849      Component Value Date/Time   CALCIUM 9.4 09/08/2020 0849   ALKPHOS 77 03/18/2019 0826   AST 16 09/08/2020 0849   ALT 25 09/08/2020 0849   BILITOT 0.6 09/08/2020 0849     Lab Results  Component Value Date   HGBA1C 7.2 (H) 09/08/2020    IMPRESSION AND PLAN:  1) Seasonal affective d/o:  Adverse reaction to citalopram.  Recalls similar rxn to "another med like that" when in his 28s. 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  2) DM, not well controlled. Cont to work on diet/exercise. Cont actos 45mg  qd. Needs additional med: he prefers to avoid injection if possible, also tends to not eat regularly in daytime esp in spring/summer so I'll avoid sulfonylureas. Instructions: "Ask your insurer which of the following meds is "preferred" on their  formulary: Farxiga, Invokana, or Jardiance." Next a1c 5 wks.  An After Visit Summary was printed and given to the patient.  FOLLOW UP: Return in about 5 weeks (around 12/15/2020) for f/u DM/HTN.  Signed:  Crissie Sickles, MD           11/10/2020

## 2020-11-15 DIAGNOSIS — M5412 Radiculopathy, cervical region: Secondary | ICD-10-CM | POA: Diagnosis not present

## 2020-11-15 DIAGNOSIS — M542 Cervicalgia: Secondary | ICD-10-CM | POA: Diagnosis not present

## 2020-11-15 DIAGNOSIS — M4802 Spinal stenosis, cervical region: Secondary | ICD-10-CM | POA: Diagnosis not present

## 2020-11-16 ENCOUNTER — Telehealth: Payer: Self-pay | Admitting: Family Medicine

## 2020-11-16 MED ORDER — EMPAGLIFLOZIN 10 MG PO TABS: 10.0000 mg | ORAL_TABLET | Freq: Every day | ORAL | 3 refills | Status: DC

## 2020-11-16 NOTE — Telephone Encounter (Signed)
I'll eRx jardiance now and well have to do prior auth.

## 2020-11-16 NOTE — Telephone Encounter (Signed)
Pt was last seen on 11/10/20, mentioned in note "Needs additional med: he prefers to avoid injection if possible, also tends to not eat regularly in daytime esp in spring/summer so I'll avoid sulfonylureas. Instructions: "Ask your insurer which of the following meds is "preferred" on their formulary: Farxiga, Invokana, or Jardiance." Next a1c 5 wks"   Please advise, thanks.

## 2020-11-16 NOTE — Telephone Encounter (Signed)
Patient stats he was given the name of 3 meds to check with in insurance with.. One was not covered the other 2 [place]...& Jardiace need Pre-auth. Please call  [phone]  For [employer].

## 2020-11-16 NOTE — Telephone Encounter (Signed)
Patient was made aware medication sent, hopeful to start PA tomorrow and will provide update once decision made.

## 2020-11-20 MED ORDER — DAPAGLIFLOZIN PROPANEDIOL 10 MG PO TABS: 10.0000 mg | ORAL_TABLET | Freq: Every day | ORAL | 3 refills | Status: DC

## 2020-11-20 NOTE — Telephone Encounter (Signed)
LM for pt to return call regarding rx coverage.   PA sent via covermymed on 11/20/20   Key: [id number]    Medication: Jardiance   Dx: E11.9   Per Dr. Anitra Lauth pt has tried and failed:  1.Metformin 500mg  09/06/14-09/08/20 2. Pioglitazone 15mg  09/08/20-10/13/20 3. Pioglitazone 45mg  10/13/20-curren   Waiting for response.    PA approved 11/20/20-11/20/2021.

## 2020-11-20 NOTE — Telephone Encounter (Signed)
Spoke with patient regarding med change to farxiga, voiced understanding

## 2020-11-20 NOTE — Telephone Encounter (Signed)
OK, I'll send in rx for farxiga to see if cost is any better for this one.

## 2020-11-20 NOTE — Telephone Encounter (Signed)
Spoke with patient to inform PA approved but he received a text from the pharmacy stating the medication was $220. He cannot afford that  Please advise if alternative med options.

## 2020-11-23 NOTE — Telephone Encounter (Signed)
PA approved 11/22/20-11/22/21, spoke with pt's pharmacy regarding cost of med ($15). Called patient to inform but he received a text yesterday with the update. Nothing further needed. Provider verbally made aware.   PA sent via covermymed on 11/22/20   Key: [id number]   Medication: Wilder Glade 10mg  tab   Dx: E11.9   Per Dr. Anitra Lauth pt has tried and failed:  1.Metformin 51.Metformin 500mg  09/06/14-09/08/20 2. Pioglitazone 15mg  09/08/20-10/13/20 3. Pioglitazone 45mg  10/13/20-current    Waiting for response.

## 2020-11-24 ENCOUNTER — Encounter: Payer: Self-pay | Admitting: Family Medicine

## 2020-12-14 ENCOUNTER — Ambulatory Visit: Payer: 59 | Admitting: Family Medicine

## 2020-12-14 DIAGNOSIS — E119 Type 2 diabetes mellitus without complications: Secondary | ICD-10-CM | POA: Diagnosis not present

## 2020-12-14 LAB — HM DIABETES EYE EXAM

## 2021-01-03 ENCOUNTER — Other Ambulatory Visit: Payer: Self-pay | Admitting: Family Medicine

## 2021-01-03 NOTE — Progress Notes (Signed)
OFFICE VISIT  01/04/2021  CC:  Chief Complaint  Patient presents with  . Follow-up    DM and HTN    HPI:    Patient is a 60 y.o. Caucasian male who presents for 6 wk f/u DM, HTN, and seasonal affective d/o. A/P as of last visit: "1) Seasonal affective d/o:  Adverse reaction to citalopram.  Recalls similar rxn to "another med like that" when in his 57s. 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  2) DM, not well controlled. Cont to work on diet/exercise. Cont actos 45mg  qd. Needs additional med: he prefers to avoid injection if possible, also tends to not eat regularly in daytime esp in spring/summer so I'll avoid sulfonylureas. Instructions: "Ask your insurer which of the following meds is "preferred" on their formulary: Farxiga, Invokana, or Jardiance." Next a1c 5 wks."  INTERIM HX: Feeling well.  DM: Started pt on farxiga 10mg  qd about 5 wks ago.  No side effects from med. approx weekly fasting gluc 160-180 range still. Diet "could be better".  No exercise.  HTN: home bp checks 120s/70s, HR high 50s/low 60s.  MOOD:  A lot better since daylight savings and business picking up.  ROS as above, plus--> no fevers, no CP, no SOB, no wheezing, no cough, no dizziness, no HAs, no rashes, no melena/hematochezia.  No polyuria or polydipsia.  No myalgias or arthralgias.  No focal weakness, paresthesias, or tremors.  No acute vision or hearing abnormalities.  No dysuria or unusual/new urinary urgency or frequency.  No recent changes in lower legs. No n/v/d or abd pain.  No palpitations.      Past Medical History:  Diagnosis Date  . Asthma    "grew out of it", esp after quitting smoking arond age 26 yrs old  . Atypical chest pain 06/12/2014  . Carpal tunnel syndrome    bilat, L>>R (Dr. Vertell Limber).  Hand symptoms prompted referral to rheum--lab w/u NEG.  Wrist splinting x 6 wks advised, not  much help so low dose prednisone rx'd, then pt had to get steroid injection into L carpal tunnel 12/2016.  Marland Kitchen Cervical myelopathy [facility])    Surgical fusion--Dr Vertell Limber (cerv spinal stenosis).  Stable as of 08/2019 and 10/2020 neurosurg f/u.  Marland Kitchen Chronic low back pain   . COVID-19 virus infection 05/2020  . Diabetes mellitus without complication ([place])    No diab retinop as of 12/2014  . Gout    Knee and toe--allopurinol since approx 2012 and has had no flares since being on this med (1/2 of 300 mg tab qd)  . History of hyperkalemia 2015/2016   low K diet; cut lisinopril from 10mg  to 5mg  qd 11/2014.  Marland Kitchen Hyperlipidemia    med x 1 trial=adverse side effects so he stopped it.  LDL 130s 08/2014, then dx'd with DM 2 shortly after, pt refused another trial of statin 05/2017 and 2020.  Marland Kitchen Hypertension   . MDD (major depressive disorder), recurrent episode ([place])    initiated tx 09/2020  . OSA (obstructive sleep apnea)    Remote past: had cpap but didn't use much .  REPEAT HOME SLEEP STUDY 09/16/19-> severe OSA, pt got on CPAP after this.  . Paresthesia of both hands    Dr. Vertell Limber: MRI 01/2018: no acute abnormality.  Stable-to-improved chronic spine/spinal cord changes, fusion good. MRI stable 10/2020->cont gabapentin hs  . Umbilical hernia 2536  Past Surgical History:  Procedure Laterality Date  . ANTERIOR CERVICAL DECOMP/DISCECTOMY FUSION N/A 03/07/2017   Procedure: Cervical four-five, Cervical five-six, Cerivcal six- seven Anterior cervical decompression/discectomy/fusion WITH CERVICAL FIVE COREPECTOMY;  Surgeon: Erline Levine, MD;  Location: [place];  Service: Neurosurgery;  Laterality: N/A;  C4-5 C5-6 C6-7 Anterior cervical decompression/discectomy/fusion  . BACK SURGERY  12/05/2016   Lumbar, Dr. Vertell Limber at [facility] center  . COLONOSCOPY W/ POLYPECTOMY  10/13/14   Hyperplastic; recall 10 yrs  . Sleep study  09/16/2019   Home sleep study--severe OSA->CPAP recommended  . VASECTOMY    . WISDOM TOOTH  EXTRACTION      Outpatient Medications Prior to Visit  Medication Sig Dispense Refill  . allopurinol (ZYLOPRIM) 300 MG tablet Take 1 tablet by mouth once daily 90 tablet 0  . aspirin EC 81 MG tablet Take 81 mg by mouth daily.    . dapagliflozin propanediol (FARXIGA) 10 MG TABS tablet Take 1 tablet (10 mg total) by mouth daily before breakfast. 30 tablet 3  . irbesartan (AVAPRO) 75 MG tablet Take 1 tablet (75 mg total) by mouth daily. 30 tablet 5  . pioglitazone (ACTOS) 45 MG tablet Take 1 tablet (45 mg total) by mouth daily. 30 tablet 2  . Plant Sterols and Stanols (CHOLESTOFF PO) Take by mouth daily.    Vladimir Faster Glycol-Propyl Glycol 0.4-0.3 % SOLN Place 1-2 drops into both eyes 4 (four) times daily as needed (for dry eyes.).     No facility-administered medications prior to visit.    Allergies  Allergen Reactions  . Metformin And Related Diarrhea    Nausea and diarrhea  . Prednisone Other (See Comments)    Headaches, muscle aches, fatigue  . Lisinopril Cough    ROS As per HPI  PE: Vitals with BMI 01/04/2021 11/10/2020 10/13/2020  Height 5\' 5"  5\' 5"  5\' 5"   Weight 203 lbs 13 oz 202 lbs 3 oz 200 lbs 13 oz  BMI 33.91 02.72 53.66  Systolic 440 347 425  Diastolic 74 67 67  Pulse 57 63 54     Gen: Alert, well appearing.  Patient is oriented to person, place, time, and situation. AFFECT: pleasant, lucid thought and speech. CV: RRR, no m/r/g.   LUNGS: CTA bilat, nonlabored resps, good aeration in all lung fields. EXT: no clubbing or cyanosis.  Trace to 1+ bilat LL pitting edema.    LABS:  Lab Results  Component Value Date   TSH 0.99 09/08/2020   Lab Results  Component Value Date   WBC 7.2 09/08/2020   HGB 16.0 09/08/2020   HCT 46.6 09/08/2020   MCV 86.9 09/08/2020   PLT 208 09/08/2020   Lab Results  Component Value Date   CREATININE 0.92 09/08/2020   BUN 17 09/08/2020   NA 142 09/08/2020   K 4.9 09/08/2020   CL 108 09/08/2020   CO2 27 09/08/2020   Lab  Results  Component Value Date   ALT 25 09/08/2020   AST 16 09/08/2020   ALKPHOS 77 03/18/2019   BILITOT 0.6 09/08/2020   Lab Results  Component Value Date   CHOL 195 09/08/2020   Lab Results  Component Value Date   HDL 54 09/08/2020   Lab Results  Component Value Date   LDLCALC 119 (H) 09/08/2020   Lab Results  Component Value Date   TRIG 111 09/08/2020   Lab Results  Component Value Date   CHOLHDL 3.6 09/08/2020   Lab Results  Component Value Date  PSA 1.35 03/18/2019   PSA 1.14 11/26/2017   PSA 1.04 09/26/2016   Lab Results  Component Value Date   HGBA1C 7.2 (H) 09/08/2020   IMPRESSION AND PLAN:  1) DM, not well controlled per occasional home glucose checks. Tolerating recent add-on of farxiga. He wants to give metformin another try (past hx of GI side effects, tried it 2 wks). Cont farxiga 10mg  qd and pioglit 45mg  qd. Eye exam approx 3 wks ago ->no D.R. Hba1c check today.  2) HTN; well controlled on avapro 75mg  qd. Lytes/cr today.  3) seasonal affective d/o: resolved.  4) HLD: LDL was 119 Jan 2021. Hx of statin intolerance.  Pt has repeatedly declined further trial of cholesterol-lowering medication.  An After Visit Summary was printed and given to the patient.  FOLLOW UP: Return in about 3 months (around 04/06/2021) for annual CPE (fasting).  Signed:  Crissie Sickles, MD           01/04/2021

## 2021-01-04 ENCOUNTER — Encounter: Payer: Self-pay | Admitting: Family Medicine

## 2021-01-04 ENCOUNTER — Other Ambulatory Visit: Payer: Self-pay

## 2021-01-04 ENCOUNTER — Ambulatory Visit: Payer: 59 | Admitting: Family Medicine

## 2021-01-04 VITALS — BP 125/74 | HR 57 | Resp 18 | Ht 65.0 in | Wt 203.8 lb

## 2021-01-04 DIAGNOSIS — F338 Other recurrent depressive disorders: Secondary | ICD-10-CM

## 2021-01-04 DIAGNOSIS — E7800 Pure hypercholesterolemia, unspecified: Secondary | ICD-10-CM

## 2021-01-04 DIAGNOSIS — E119 Type 2 diabetes mellitus without complications: Secondary | ICD-10-CM | POA: Diagnosis not present

## 2021-01-04 DIAGNOSIS — I10 Essential (primary) hypertension: Secondary | ICD-10-CM

## 2021-01-04 MED ORDER — METFORMIN HCL 500 MG PO TABS: 500.0000 mg | ORAL_TABLET | Freq: Two times a day (BID) | ORAL | 6 refills | Status: DC

## 2021-01-05 LAB — BASIC METABOLIC PANEL
BUN: 23 mg/dL (ref 7–25)
CO2: 26 mmol/L (ref 20–32)
Calcium: 9.6 mg/dL (ref 8.6–10.3)
Chloride: 108 mmol/L (ref 98–110)
Creat: 1.17 mg/dL (ref 0.70–1.25)
Glucose, Bld: 133 mg/dL — ABNORMAL HIGH (ref 65–99)
Potassium: 5.3 mmol/L (ref 3.5–5.3)
Sodium: 141 mmol/L (ref 135–146)

## 2021-01-05 LAB — HEMOGLOBIN A1C
Hgb A1c MFr Bld: 6.2 % of total Hgb — ABNORMAL HIGH (ref <5.7)
Mean Plasma Glucose: 131 mg/dL
eAG (mmol/L): 7.3 mmol/L

## 2021-01-08 ENCOUNTER — Other Ambulatory Visit: Payer: Self-pay | Admitting: Family Medicine

## 2021-03-24 ENCOUNTER — Other Ambulatory Visit: Payer: Self-pay | Admitting: Family Medicine

## 2021-04-10 ENCOUNTER — Other Ambulatory Visit: Payer: Self-pay | Admitting: Family Medicine

## 2021-04-12 ENCOUNTER — Encounter: Payer: 59 | Admitting: Family Medicine

## 2021-04-24 ENCOUNTER — Other Ambulatory Visit: Payer: Self-pay | Admitting: Family Medicine

## 2021-05-03 NOTE — Progress Notes (Signed)
PATIENT: Derrick Willis DOB: 1961-06-27  REASON FOR VISIT: follow up HISTORY FROM: patient  Chief Complaint  Patient presents with   Obstructive Sleep Apnea    Rm 2, alone. Here for CPAP f/u, pt reports doing well w/ cpap therapies.       HISTORY OF PRESENT ILLNESS:  05/07/21 ALL: Derrick Willis returns for follow up for OSA on CPAP. He reports doing well. He is using CPAP most every night. He does note significant benefit when using CPAP. He does report needing to change his mask. He can tell that air is leaking when he wakes in the mornings. Otherwise, he is feeling well and without complaints.     05/04/2020 ALL:  Derrick Willis is a 60 y.o. male here today for follow up for OSA on CPAP.   Compliance report dated 04/03/2020 through 05/02/2020 reveals that he used CPAP 27 of the past 30 days for compliance of 90%.  He is CPAP greater than 4 hours 27 of the past 30 days for compliance of 90%.  Average usage on days used was 6 hours and 38 minutes.  Residual AHI was 0.6 on 6 to 18 cm of water and an EPR of 2.  There was no significant leak noted.  HISTORY: (copied from my note on 11/02/2019)  Derrick Willis is a 60 y.o. male here today for follow up OSA recently started on CPAP therapy. HST revealed total AHI of 31.5/hr and REM sleep AHI of 52.3/hr. No prolonged hypoxemia noted.  Derrick Willis reports that he is doing very well on CPAP therapy.  He notes significant improvement in sleep quality and feels less fatigued throughout the day.  He is now sleeping through the night without waking to use the restroom.  He denies any difficulty with his machine.   Compliance report dated 10/02/2019 through 10/31/2019 reveals that he is used CPAP 30 of the last 30 days for compliance of 100%.  He used CPAP greater than 4 hours all 30 days for compliance of 100%.  Average usage was 6 hours and 35 minutes.  Residual AHI was 1.2 on 6 to 18 cm of water and an EPR of 2.  There was no significant leak noted.   HISTORY:  (copied from  note on 09/06/2019)   Derrick Willis is a 60 year old Caucasian male patient seen upon referral on 09/06/2019 .  Chief concern according to patient :  I have a CPAP from years ago, but it didn't do me any good. I had the study in [place], [place] at [facility].  The machine was mailed to me and I never followed up.    Mr. Factor had childhood asthma it lasted probably through his 62s as long as he was smoking which she quit around age 9.  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   He has a history of carpal tunnel syndrome surgically treated by Dr. Vertell Limber 2018 cervical myelopathy and surgical fusion December 2019 by Dr. Vertell Limber, lower back degenerative disease.  Gout affecting knee and toe, hyperkalemia, hyperlipidemia, hypertension, in June 2019 Dr. Joaquim Lai evaluated him for paresthesias of both hands.  Is  also history of umbilical hernia in Q000111Q.  And diabetes mellitus.   DOT evaluation questioned his lack of CPAP use and asked for re-evaluation. He is also seeing Dr Vertell Limber next week.     Sleep relevant medical history: Nocturia- one, no Tonsillectomy , had cervical spine surgery, but no  deviated septum or sinus surgery, no UPPP.   he had tubes in his ears at kindergarten age.  Family medical /sleep history:  other family member on CPAP with OSA, insomnia, sleep walkers.    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 and lives in a household alone with his dog.  He is divorced, his wife reportedly was very bothered by his snoring.  He has  2 adult sons, one in DO school.  The patient currently works. Tobacco use; quit at age 36 .   ETOH use - 2-3 drinks a week, Caffeine intake in form of Coffee( part decaffeinated 4-6 cups a day ) Soda( none ) Tea ( once I a while ) or energy drinks. Regular exercise in form of walking     Sleep habits are as follows: The patient's dinner time is between 6-8  PM. The patient goes to bed at 10 PM and continues to sleep for many hours,. The preferred sleep position is sideways, with the support of 2 pillows. Dreams are reportedly frequent.  6 AM is the usual rise time. The patient wakes up spontaneously at 5-6 AM He reports  feeling refreshed or restored in AM, with symptoms such as dry mouth, but no morning headaches, and only mild residual fatigue.  Naps are taken in frequently    REVIEW OF SYSTEMS: Out of a complete 14 system review of symptoms, the patient complains only of the following symptoms, none and all other reviewed systems are negative.  ESS: 7   ALLERGIES: Allergies  Allergen Reactions   Metformin And Related Diarrhea    Nausea and diarrhea   Prednisone Other (See Comments)    Headaches, muscle aches, fatigue   Lisinopril Cough    HOME MEDICATIONS: Outpatient Medications Prior to Visit  Medication Sig Dispense Refill   allopurinol (ZYLOPRIM) 300 MG tablet Take 1 tablet by mouth once daily 90 tablet 0   aspirin EC 81 MG tablet Take 81 mg by mouth daily.     FARXIGA 10 MG TABS tablet TAKE 1 TABLET BY MOUTH ONCE DAILY BEFORE BREAKFAST 90 tablet 0   irbesartan (AVAPRO) 75 MG tablet Take 1 tablet (75 mg total) by mouth daily. 30 tablet 5   Multiple Vitamins-Minerals (MENS MULTIVITAMIN) TABS Take 1 tablet by mouth daily.     pioglitazone (ACTOS) 45 MG tablet Take 1 tablet by mouth once daily 90 tablet 0   Plant Sterols and Stanols (CHOLESTOFF PO) Take by mouth daily.     Polyethyl Glycol-Propyl Glycol 0.4-0.3 % SOLN Place 1-2 drops into both eyes 4 (four) times daily as  needed (for dry eyes.).     metFORMIN (GLUCOPHAGE) 500 MG tablet Take 1 tablet (500 mg total) by mouth 2 (two) times daily with a meal. 60 tablet 6   No facility-administered medications prior to visit.    PAST MEDICAL HISTORY: Past Medical History:  Diagnosis Date   Asthma    "grew out of it", esp after quitting smoking arond age 25 yrs old   Atypical chest pain 06/12/2014   Carpal tunnel syndrome    bilat, L>>R (Dr. Vertell Limber).  Hand symptoms prompted  referral to rheum--lab w/u NEG.  Wrist splinting x 6 wks advised, not much help so low dose prednisone rx'd, then pt had to get steroid injection into L carpal tunnel 12/2016.   Cervical myelopathy [facility])    Surgical fusion--Dr Vertell Limber (cerv spinal stenosis).  Stable as of 08/2019 and 10/2020 neurosurg f/u.   Chronic low back pain    COVID-19 virus infection 05/2020   Diabetes mellitus without complication ([place])    No diab retinop as of 12/2014   Gout    Knee and toe--allopurinol since approx 2012 and has had no flares since being on this med (1/2 of 300 mg tab qd)   History of hyperkalemia 2015/2016   low K diet; cut lisinopril from '10mg'$  to '5mg'$  qd 11/2014.   Hyperlipidemia    med x 1 trial=adverse side effects so he stopped it.  LDL 130s 08/2014, then dx'd with DM 2 shortly after, pt refused another trial of statin 05/2017 and 2020.   Hypertension    MDD (major depressive disorder), recurrent episode ([place])    initiated tx 09/2020   OSA (obstructive sleep apnea)    Remote past: had cpap but didn't use much .  REPEAT HOME SLEEP STUDY 09/16/19-> severe OSA, pt got on CPAP after this.   Paresthesia of both hands    Dr. Vertell Limber: MRI 01/2018: no acute abnormality.  Stable-to-improved chronic spine/spinal cord changes, fusion good. MRI stable 10/2020->cont gabapentin hs   Umbilical hernia [id number]    PAST SURGICAL HISTORY: Past Surgical History:  Procedure Laterality Date   ANTERIOR CERVICAL DECOMP/DISCECTOMY FUSION N/A 03/07/2017   Procedure: Cervical  four-five, Cervical five-six, Cerivcal six- seven Anterior cervical decompression/discectomy/fusion WITH CERVICAL FIVE COREPECTOMY;  Surgeon: Erline Levine, MD;  Location: [facility];  Service: Neurosurgery;  Laterality: N/A;  C4-5 C5-6 C6-7 Anterior cervical decompression/discectomy/fusion   BACK SURGERY  12/05/2016   Lumbar, Dr. Vertell Limber at [facility]   COLONOSCOPY W/ POLYPECTOMY  10/13/14   Hyperplastic; recall 10 yrs   Sleep study  09/16/2019   Home sleep study--severe OSA->CPAP recommended   VASECTOMY     WISDOM TOOTH EXTRACTION      FAMILY HISTORY: Family History  Problem Relation Age of Onset   Arthritis Mother    Arthritis Father    Hyperlipidemia Father    Hypertension Father    Colon polyps Neg Hx    Esophageal cancer Neg Hx    Rectal cancer Neg Hx    Stomach cancer Neg Hx     SOCIAL HISTORY: Social History   Socioeconomic History   Marital status: Divorced    Spouse name: Not on file   Number of children: Not on file   Years of education: Not on file   Highest education level: Not on file  Occupational History   Not on file  Tobacco Use   Smoking status: Former    Packs/day: 1.50    Years: 12.00    Pack years: 18.00    Types: Cigars, Cigarettes    Quit date: 05/31/1990    Years since quitting: 30.9   Smokeless tobacco: Never   Tobacco comments:    Quit smoking Cigars 10/2015  Vaping Use   Vaping Use: Never used  Substance and Sexual Activity   Alcohol use: Yes    Alcohol/week: 1.0 - 2.0 standard drink    Types: 1 - 2 Glasses of wine per week    Comment: 2-3 x weekly   Drug use: No   Sexual activity: Not on  file  Other Topics Concern   Not on file  Social History Narrative   Divorced.  Two children, both young adults (one son plans to go to med school).   Occupation: Grading contractor-also a Psychologist, sport and exercise.   Education: 2 years of college.   Orig from [place], still lives there.   Tob 30 pack-yr hx, quit around age 82.   Social drinker.     No  regular exercise.  Eating habits poor.   Social Determinants of Health   Financial Resource Strain: Not on file  Food Insecurity: Not on file  Transportation Needs: Not on file  Physical Activity: Not on file  Stress: Not on file  Social Connections: Not on file  Intimate Partner Violence: Not on file      PHYSICAL EXAM  Vitals:   05/07/21 0744  BP: 114/62  Pulse: (!) 56  Weight: 207 lb 8 oz (94.1 kg)  Height: '5\' 5"'$  (1.651 m)    Body mass index is 34.53 kg/m.  Generalized: Well developed, in no acute distress  Cardiology: normal rate and rhythm, no murmur noted Respiratory: clear to auscultation bilaterally  Neurological examination  Mentation: Alert oriented to time, place, history taking. Follows all commands speech and language fluent Cranial nerve II-XII: Pupils were equal round reactive to light. Extraocular movements were full, visual field were full  Motor: The motor testing reveals 5 over 5 strength of all 4 extremities. Good symmetric motor tone is noted throughout.  Gait and station: Gait is normal.    DIAGNOSTIC DATA (LABS, IMAGING, TESTING) - I reviewed patient records, labs, notes, testing and imaging myself where available.  No flowsheet data found.   Lab Results  Component Value Date   WBC 7.2 09/08/2020   HGB 16.0 09/08/2020   HCT 46.6 09/08/2020   MCV 86.9 09/08/2020   PLT 208 09/08/2020      Component Value Date/Time   NA 141 01/04/2021 0851   K 5.3 01/04/2021 0851   CL 108 01/04/2021 0851   CO2 26 01/04/2021 0851   GLUCOSE 133 (H) 01/04/2021 0851   BUN 23 01/04/2021 0851   CREATININE 1.17 01/04/2021 0851   CALCIUM 9.6 01/04/2021 0851   PROT 6.6 09/08/2020 0849   ALBUMIN 4.7 03/18/2019 0826   AST 16 09/08/2020 0849   ALT 25 09/08/2020 0849   ALKPHOS 77 03/18/2019 0826   BILITOT 0.6 09/08/2020 0849   GFRNONAA >60 03/05/2017 0951   GFRAA >60 03/05/2017 0951   Lab Results  Component Value Date   CHOL 195 09/08/2020   HDL 54  09/08/2020   LDLCALC 119 (H) 09/08/2020   TRIG 111 09/08/2020   CHOLHDL 3.6 09/08/2020   Lab Results  Component Value Date   HGBA1C 6.2 (H) 01/04/2021   Lab Results  Component Value Date   VITAMINB12 515 08/08/2015   Lab Results  Component Value Date   TSH 0.99 09/08/2020       ASSESSMENT AND PLAN 60 y.o. year old male  has a past medical history of Asthma, Atypical chest pain (06/12/2014), Carpal tunnel syndrome, Cervical myelopathy ([place]), Chronic low back pain, COVID-19 virus infection (05/2020), Diabetes mellitus without complication ([place]), Gout, History of hyperkalemia (2015/2016), Hyperlipidemia, Hypertension, MDD (major depressive disorder), recurrent episode ([place]), OSA (obstructive sleep apnea), Paresthesia of both hands, and Umbilical hernia (Q000111Q). here with     ICD-10-CM   1. OSA on CPAP  G47.33 For home use only DME continuous positive airway pressure (CPAP)   Z99.89  Ryelin is doing very well on CPAP therapy.  Compliance report reveals optimal compliance.  He was encouraged to continue using CPAP nightly and for greater than 4 hours each night. He will monitor leak and change FFM out regularly. I will recheck download in 45-60 days to assess for worsening leak. AHI is well managed. Healthy lifestyle habits encouraged.  He will follow-up with primary care as directed.  He will return to see [place] in 1 year, sooner if needed.  He verbalizes understanding and agreement with this plan.   Orders Placed This Encounter  Procedures   For home use only DME continuous positive airway pressure (CPAP)    Supplies    Order Specific Question:   Length of Need    Answer:   Lifetime    Order Specific Question:   Patient has OSA or probable OSA    Answer:   Yes    Order Specific Question:   Is the patient currently using CPAP in the home    Answer:   Yes    Order Specific Question:   Settings    Answer:   Other see comments    Order Specific Question:   CPAP supplies needed     Answer:   Mask, headgear, cushions, filters, heated tubing and water chamber      No orders of the defined types were placed in this encounter.     Debbora Presto, FNP-C 05/07/2021, 8:03 AM [facility] [address], [place], [place] [address] [phone]

## 2021-05-03 NOTE — Patient Instructions (Signed)
Please continue using your CPAP regularly. While your insurance requires that you use CPAP at least 4 hours each night on 70% of the nights, I recommend, that you not skip any nights and use it throughout the night if you can. Getting used to CPAP and staying with the treatment long term does take time and patience and discipline. Untreated obstructive sleep apnea when it is moderate to severe can have an adverse impact on cardiovascular health and raise her risk for heart disease, arrhythmias, hypertension, congestive heart failure, stroke and diabetes. Untreated obstructive sleep apnea causes sleep disruption, nonrestorative sleep, and sleep deprivation. This can have an impact on your day to day functioning and cause daytime sleepiness and impairment of cognitive function, memory loss, mood disturbance, and problems focussing. Using CPAP regularly can improve these symptoms.  Please monitor for air leak at home. If leak continues, please let me know and we can send you for a mask refitting.   Follow up in 1 year

## 2021-05-06 ENCOUNTER — Other Ambulatory Visit: Payer: Self-pay | Admitting: Family Medicine

## 2021-05-06 DIAGNOSIS — I10 Essential (primary) hypertension: Secondary | ICD-10-CM

## 2021-05-07 ENCOUNTER — Ambulatory Visit: Payer: 59 | Admitting: Family Medicine

## 2021-05-07 ENCOUNTER — Encounter: Payer: Self-pay | Admitting: Family Medicine

## 2021-05-07 VITALS — BP 114/62 | HR 56 | Ht 65.0 in | Wt 207.5 lb

## 2021-05-07 DIAGNOSIS — G4733 Obstructive sleep apnea (adult) (pediatric): Secondary | ICD-10-CM

## 2021-05-07 DIAGNOSIS — Z9989 Dependence on other enabling machines and devices: Secondary | ICD-10-CM | POA: Diagnosis not present

## 2021-05-08 NOTE — Progress Notes (Signed)
CM sent to [facility] 

## 2021-06-04 ENCOUNTER — Other Ambulatory Visit: Payer: Self-pay | Admitting: Family Medicine

## 2021-06-04 DIAGNOSIS — I10 Essential (primary) hypertension: Secondary | ICD-10-CM

## 2021-06-05 ENCOUNTER — Other Ambulatory Visit: Payer: Self-pay

## 2021-06-05 ENCOUNTER — Ambulatory Visit (INDEPENDENT_AMBULATORY_CARE_PROVIDER_SITE_OTHER): Payer: 59 | Admitting: Family Medicine

## 2021-06-05 ENCOUNTER — Encounter: Payer: Self-pay | Admitting: Family Medicine

## 2021-06-05 VITALS — BP 103/61 | HR 66 | Temp 98.1°F | Resp 14 | Ht 65.0 in | Wt 208.2 lb

## 2021-06-05 DIAGNOSIS — Z0000 Encounter for general adult medical examination without abnormal findings: Secondary | ICD-10-CM

## 2021-06-05 DIAGNOSIS — E7800 Pure hypercholesterolemia, unspecified: Secondary | ICD-10-CM

## 2021-06-05 DIAGNOSIS — I10 Essential (primary) hypertension: Secondary | ICD-10-CM

## 2021-06-05 DIAGNOSIS — E119 Type 2 diabetes mellitus without complications: Secondary | ICD-10-CM | POA: Diagnosis not present

## 2021-06-05 DIAGNOSIS — Z125 Encounter for screening for malignant neoplasm of prostate: Secondary | ICD-10-CM

## 2021-06-05 NOTE — Patient Instructions (Signed)
Health Maintenance, Male Adopting a healthy lifestyle and getting preventive care are important in promoting health and wellness. Ask your health care provider about: The right schedule for you to have regular tests and exams. Things you can do on your own to prevent diseases and keep yourself healthy. What should I know about diet, weight, and exercise? Eat a healthy diet  Eat a diet that includes plenty of vegetables, fruits, low-fat dairy products, and lean protein. Do not eat a lot of foods that are high in solid fats, added sugars, or sodium. Maintain a healthy weight Body mass index (BMI) is a measurement that can be used to identify possible weight problems. It estimates body fat based on height and weight. Your health care provider can help determine your BMI and help you achieve or maintain a healthy weight. Get regular exercise Get regular exercise. This is one of the most important things you can do for your health. Most adults should: Exercise for at least 150 minutes each week. The exercise should increase your heart rate and make you sweat (moderate-intensity exercise). Do strengthening exercises at least twice a week. This is in addition to the moderate-intensity exercise. Spend less time sitting. Even light physical activity can be beneficial. Watch cholesterol and blood lipids Have your blood tested for lipids and cholesterol at 60 years of age, then have this test every 5 years. You may need to have your cholesterol levels checked more often if: Your lipid or cholesterol levels are high. You are older than 60 years of age. You are at high risk for heart disease. What should I know about cancer screening? Many types of cancers can be detected early and may often be prevented. Depending on your health history and family history, you may need to have cancer screening at various ages. This may include screening for: Colorectal cancer. Prostate cancer. Skin cancer. Lung  cancer. What should I know about heart disease, diabetes, and high blood pressure? Blood pressure and heart disease High blood pressure causes heart disease and increases the risk of stroke. This is more likely to develop in people who have high blood pressure readings, are of African descent, or are overweight. Talk with your health care provider about your target blood pressure readings. Have your blood pressure checked: Every 3-5 years if you are 18-39 years of age. Every year if you are 40 years old or older. If you are between the ages of 65 and 75 and are a current or former smoker, ask your health care provider if you should have a one-time screening for abdominal aortic aneurysm (AAA). Diabetes Have regular diabetes screenings. This checks your fasting blood sugar level. Have the screening done: Once every three years after age 45 if you are at a normal weight and have a low risk for diabetes. More often and at a younger age if you are overweight or have a high risk for diabetes. What should I know about preventing infection? Hepatitis B If you have a higher risk for hepatitis B, you should be screened for this virus. Talk with your health care provider to find out if you are at risk for hepatitis B infection. Hepatitis C Blood testing is recommended for: Everyone born from 1945 through 1965. Anyone with known risk factors for hepatitis C. Sexually transmitted infections (STIs) You should be screened each year for STIs, including gonorrhea and chlamydia, if: You are sexually active and are younger than 60 years of age. You are older than 60 years   of age and your health care provider tells you that you are at risk for this type of infection. Your sexual activity has changed since you were last screened, and you are at increased risk for chlamydia or gonorrhea. Ask your health care provider if you are at risk. Ask your health care provider about whether you are at high risk for HIV.  Your health care provider may recommend a prescription medicine to help prevent HIV infection. If you choose to take medicine to prevent HIV, you should first get tested for HIV. You should then be tested every 3 months for as long as you are taking the medicine. Follow these instructions at home: Lifestyle Do not use any products that contain nicotine or tobacco, such as cigarettes, e-cigarettes, and chewing tobacco. If you need help quitting, ask your health care provider. Do not use street drugs. Do not share needles. Ask your health care provider for help if you need support or information about quitting drugs. Alcohol use Do not drink alcohol if your health care provider tells you not to drink. If you drink alcohol: Limit how much you have to 0-2 drinks a day. Be aware of how much alcohol is in your drink. In the [place] one drink equals one 12 oz bottle of beer (355 mL), one 5 oz glass of wine (148 mL), or one 1 oz glass of hard liquor (44 mL). General instructions Schedule regular health, dental, and eye exams. Stay current with your vaccines. Tell your health care provider if: You often feel depressed. You have ever been abused or do not feel safe at home. Summary Adopting a healthy lifestyle and getting preventive care are important in promoting health and wellness. Follow your health care provider's instructions about healthy diet, exercising, and getting tested or screened for diseases. Follow your health care provider's instructions on monitoring your cholesterol and blood pressure. This information is not intended to replace advice given to you by your health care provider. Make sure you discuss any questions you have with your health care provider. Document Revised: 10/20/2020 Document Reviewed: 08/05/2018 Elsevier Patient Education  2022 [employer]  

## 2021-06-05 NOTE — Progress Notes (Signed)
Office Note 06/05/2021  CC:  Chief Complaint  Patient presents with   Annual Exam    Pt is fasting    HPI:  Patient is a 60 y.o. male who is here for annual health maintenance exam and 5 mo f/u DM, HTN, and seasonal affective d/o. Feeling well, no complaints.  Doing better now that the stress of moving his mom into his home is gone. Mood good, no signif anxiety issues. No home glucose monitoring. Home bp monitoring: "good but not checking much".   Past Medical History:  Diagnosis Date   Asthma    "grew out of it", esp after quitting smoking arond age 89 yrs old   Atypical chest pain 06/12/2014   Carpal tunnel syndrome    bilat, L>>R (Dr. Vertell Limber).  Hand symptoms prompted referral to rheum--lab w/u NEG.  Wrist splinting x 6 wks advised, not much help so low dose prednisone rx'd, then pt had to get steroid injection into L carpal tunnel 12/2016.   Cervical myelopathy [facility])    Surgical fusion--Dr Vertell Limber (cerv spinal stenosis).  Stable as of 08/2019 and 10/2020 neurosurg f/u.   Chronic low back pain    COVID-19 virus infection 05/2020   Diabetes mellitus without complication ([place])    No diab retinop as of 12/2014   Gout    Knee and toe--allopurinol since approx 2012 and has had no flares since being on this med (1/2 of 300 mg tab qd)   History of hyperkalemia 2015/2016   low K diet; cut lisinopril from 10mg  to 5mg  qd 11/2014.   Hyperlipidemia    med x 1 trial=adverse side effects so he stopped it.  LDL 130s 08/2014, then dx'd with DM 2 shortly after, pt refused another trial of statin 05/2017 and 2020.   Hypertension    MDD (major depressive disorder), recurrent episode ([place])    initiated tx 09/2020   OSA (obstructive sleep apnea)    Remote past: had cpap but didn't use much .  REPEAT HOME SLEEP STUDY 09/16/19-> severe OSA, pt got on CPAP after this.   Paresthesia of both hands    Dr. Vertell Limber: MRI 01/2018: no acute abnormality.  Stable-to-improved chronic spine/spinal cord changes,  fusion good. MRI stable 10/2020->cont gabapentin hs   Umbilical hernia [id number]    Past Surgical History:  Procedure Laterality Date   ANTERIOR CERVICAL DECOMP/DISCECTOMY FUSION N/A 03/07/2017   Procedure: Cervical four-five, Cervical five-six, Cerivcal six- seven Anterior cervical decompression/discectomy/fusion WITH CERVICAL FIVE COREPECTOMY;  Surgeon: Erline Levine, MD;  Location: [place];  Service: Neurosurgery;  Laterality: N/A;  C4-5 C5-6 C6-7 Anterior cervical decompression/discectomy/fusion   BACK SURGERY  12/05/2016   Lumbar, Dr. Vertell Limber at [facility] surgical center   COLONOSCOPY W/ POLYPECTOMY  10/13/14   Hyperplastic; recall 10 yrs   Sleep study  09/16/2019   Home sleep study--severe OSA->CPAP recommended   VASECTOMY     WISDOM TOOTH EXTRACTION      Family History  Problem Relation Age of Onset   Arthritis Mother    Arthritis Father    Hyperlipidemia Father    Hypertension Father    Colon polyps Neg Hx    Esophageal cancer Neg Hx    Rectal cancer Neg Hx    Stomach cancer Neg Hx     Social History   Socioeconomic History   Marital status: Divorced    Spouse name: Not on file   Number of children: Not on file   Years of education: Not on file  Highest education level: Not on file  Occupational History   Not on file  Tobacco Use   Smoking status: Former    Packs/day: 1.50    Years: 12.00    Pack years: 18.00    Types: Cigars, Cigarettes    Quit date: 05/31/1990    Years since quitting: 31.0   Smokeless tobacco: Never   Tobacco comments:    Quit smoking Cigars 10/2015  Vaping Use   Vaping Use: Never used  Substance and Sexual Activity   Alcohol use: Yes    Alcohol/week: 1.0 - 2.0 standard drink    Types: 1 - 2 Glasses of wine per week    Comment: 2-3 x weekly   Drug use: No   Sexual activity: Not on file  Other Topics Concern   Not on file  Social History Narrative   Divorced.  Two children, both young adults (one son plans to go to med school).    Occupation: Grading contractor-also a Psychologist, sport and exercise.   Education: 2 years of college.   Orig from [place], still lives there.   Tob 30 pack-yr hx, quit around age 89.   Social drinker.     No regular exercise.  Eating habits poor.   Social Determinants of Health   Financial Resource Strain: Not on file  Food Insecurity: Not on file  Transportation Needs: Not on file  Physical Activity: Not on file  Stress: Not on file  Social Connections: Not on file  Intimate Partner Violence: Not on file    Outpatient Medications Prior to Visit  Medication Sig Dispense Refill   allopurinol (ZYLOPRIM) 300 MG tablet Take 1 tablet by mouth once daily 90 tablet 0   aspirin EC 81 MG tablet Take 81 mg by mouth daily.     FARXIGA 10 MG TABS tablet TAKE 1 TABLET BY MOUTH ONCE DAILY BEFORE BREAKFAST 90 tablet 0   irbesartan (AVAPRO) 75 MG tablet Take 1 tablet by mouth once daily 30 tablet 0   Multiple Vitamins-Minerals (MENS MULTIVITAMIN) TABS Take 1 tablet by mouth daily.     pioglitazone (ACTOS) 45 MG tablet Take 1 tablet by mouth once daily 90 tablet 0   Plant Sterols and Stanols (CHOLESTOFF PO) Take by mouth daily.     Polyethyl Glycol-Propyl Glycol 0.4-0.3 % SOLN Place 1-2 drops into both eyes 4 (four) times daily as needed (for dry eyes.).     No facility-administered medications prior to visit.    Allergies  Allergen Reactions   Metformin And Related Diarrhea    Nausea and diarrhea   Prednisone Other (See Comments)    Headaches, muscle aches, fatigue   Lisinopril Cough    ROS Review of Systems  Constitutional:  Negative for appetite change, chills, fatigue and fever.  HENT:  Negative for congestion, dental problem, ear pain and sore throat.   Eyes:  Negative for discharge, redness and visual disturbance.  Respiratory:  Negative for cough, chest tightness, shortness of breath and wheezing.   Cardiovascular:  Negative for chest pain, palpitations and leg swelling.  Gastrointestinal:  Negative  for abdominal pain, blood in stool, diarrhea, nausea and vomiting.  Genitourinary:  Negative for difficulty urinating, dysuria, flank pain, frequency, hematuria and urgency.  Musculoskeletal:  Negative for arthralgias, back pain, joint swelling, myalgias and neck stiffness.  Skin:  Negative for pallor and rash.  Neurological:  Negative for dizziness, speech difficulty, weakness and headaches.  Hematological:  Negative for adenopathy. Does not bruise/bleed easily.  Psychiatric/Behavioral:  Negative for  confusion and sleep disturbance. The patient is not nervous/anxious.    PE; Vitals with BMI 06/05/2021 05/07/2021 01/04/2021  Height 5\' 5"  5\' 5"  5\' 5"   Weight 208 lbs 3 oz 207 lbs 8 oz 203 lbs 13 oz  BMI 34.65 99.24 26.83  Systolic 419 622 297  Diastolic 61 62 74  Pulse 66 56 57   Gen: Alert, well appearing.  Patient is oriented to person, place, time, and situation. AFFECT: pleasant, lucid thought and speech. ENT: Ears: EACs clear, normal epithelium.  TMs with good light reflex and landmarks bilaterally.  Eyes: no injection, icteris, swelling, or exudate.  EOMI, PERRLA. Nose: no drainage or turbinate edema/swelling.  No injection or focal lesion.  Mouth: lips without lesion/swelling.  Oral mucosa pink and moist.  Dentition intact and without obvious caries or gingival swelling.  Oropharynx without erythema, exudate, or swelling.  Neck: supple/nontender.  No LAD, mass, or TM.  Carotid pulses 2+ bilaterally, without bruits. CV: RRR, no m/r/g.   LUNGS: CTA bilat, nonlabored resps, good aeration in all lung fields. ABD: soft, NT, ND, BS normal.  No hepatospenomegaly or mass.  No bruits. EXT: no clubbing, cyanosis, or edema.  Musculoskeletal: no joint swelling, erythema, warmth, or tenderness.  ROM of all joints intact. Skin - no sores or suspicious lesions or rashes or color changes  Pertinent labs:  Lab Results  Component Value Date   TSH 0.99 09/08/2020   Lab Results  Component Value  Date   WBC 7.2 09/08/2020   HGB 16.0 09/08/2020   HCT 46.6 09/08/2020   MCV 86.9 09/08/2020   PLT 208 09/08/2020   Lab Results  Component Value Date   CREATININE 1.17 01/04/2021   BUN 23 01/04/2021   NA 141 01/04/2021   K 5.3 01/04/2021   CL 108 01/04/2021   CO2 26 01/04/2021   Lab Results  Component Value Date   ALT 25 09/08/2020   AST 16 09/08/2020   ALKPHOS 77 03/18/2019   BILITOT 0.6 09/08/2020   Lab Results  Component Value Date   CHOL 195 09/08/2020   Lab Results  Component Value Date   HDL 54 09/08/2020   Lab Results  Component Value Date   LDLCALC 119 (H) 09/08/2020   Lab Results  Component Value Date   TRIG 111 09/08/2020   Lab Results  Component Value Date   CHOLHDL 3.6 09/08/2020   Lab Results  Component Value Date   PSA 1.35 03/18/2019   PSA 1.14 11/26/2017   PSA 1.04 09/26/2016   Lab Results  Component Value Date   HGBA1C 6.2 (H) 01/04/2021   ASSESSMENT AND PLAN:   1) type II diabetes: No complications, well controlled.  Continue Farxiga 10 mg/day and pioglitazone 45 mg/day.  Checking hemoglobin A1c today.  2.  Hypertension, well controlled on irbesartan 75 mg daily. Electrolytes and creatinine checked today.  3.  Seasonal affect of disorder: He is doing much better over the last 5 to 6 months.  No medications at this time.  4. Taking "cholesoff" for years. Health maintenance exam: Reviewed age and gender appropriate health maintenance issues (prudent diet, regular exercise, health risks of tobacco and excessive alcohol, use of seatbelts, fire alarms in home, use of sunscreen).  Also reviewed age and gender appropriate health screening as well as vaccine recommendations. Vaccines: Prevnar 20->declined.  Shingrix-->declined.  Flu-->declined. Labs: fasting HP, Hba1c, and PSA ordered. Prostate ca screening: PSA today. Colon ca screening: recall 2026.  An After Visit Summary was printed  and given to the patient.  FOLLOW UP:  Return in  about 4 months (around 10/06/2021) for routine chronic illness f/u.  Signed:  Crissie Sickles, MD           06/05/2021

## 2021-06-06 ENCOUNTER — Other Ambulatory Visit: Payer: Self-pay | Admitting: Family Medicine

## 2021-06-06 ENCOUNTER — Telehealth: Payer: Self-pay

## 2021-06-06 DIAGNOSIS — I10 Essential (primary) hypertension: Secondary | ICD-10-CM

## 2021-06-06 LAB — LIPID PANEL
Cholesterol: 216 mg/dL — ABNORMAL HIGH (ref <200)
HDL: 61 mg/dL (ref >=40)
LDL Cholesterol (Calc): 130 mg/dL (calc) — ABNORMAL HIGH
Non-HDL Cholesterol (Calc): 155 mg/dL (calc) — ABNORMAL HIGH (ref <130)
Total CHOL/HDL Ratio: 3.5 (calc) (ref <5.0)
Triglycerides: 140 mg/dL (ref <150)

## 2021-06-06 LAB — CBC WITH DIFFERENTIAL/PLATELET
Absolute Monocytes: 451 cells/uL (ref 200–950)
Basophils Absolute: 98 cells/uL (ref 0–200)
Basophils Relative: 1.6 %
Eosinophils Absolute: 153 cells/uL (ref 15–500)
Eosinophils Relative: 2.5 %
HCT: 46.9 % (ref 38.5–50.0)
Hemoglobin: 15.5 g/dL (ref 13.2–17.1)
Lymphs Abs: 1427 cells/uL (ref 850–3900)
MCH: 30.3 pg (ref 27.0–33.0)
MCHC: 33 g/dL (ref 32.0–36.0)
MCV: 91.8 fL (ref 80.0–100.0)
MPV: 10.6 fL (ref 7.5–12.5)
Monocytes Relative: 7.4 %
Neutro Abs: 3971 cells/uL (ref 1500–7800)
Neutrophils Relative %: 65.1 %
Platelets: 178 10*3/uL (ref 140–400)
RBC: 5.11 10*6/uL (ref 4.20–5.80)
RDW: 14 % (ref 11.0–15.0)
Total Lymphocyte: 23.4 %
WBC: 6.1 10*3/uL (ref 3.8–10.8)

## 2021-06-06 LAB — COMPREHENSIVE METABOLIC PANEL
AG Ratio: 1.8 (calc) (ref 1.0–2.5)
ALT: 11 U/L (ref 9–46)
AST: 13 U/L (ref 10–35)
Albumin: 4.2 g/dL (ref 3.6–5.1)
Alkaline phosphatase (APISO): 82 U/L (ref 35–144)
BUN/Creatinine Ratio: 23 (calc) — ABNORMAL HIGH (ref 6–22)
BUN: 27 mg/dL — ABNORMAL HIGH (ref 7–25)
CO2: 21 mmol/L (ref 20–32)
Calcium: 9.4 mg/dL (ref 8.6–10.3)
Chloride: 110 mmol/L (ref 98–110)
Creat: 1.17 mg/dL (ref 0.70–1.35)
Globulin: 2.3 g/dL (calc) (ref 1.9–3.7)
Glucose, Bld: 139 mg/dL — ABNORMAL HIGH (ref 65–99)
Potassium: 4.9 mmol/L (ref 3.5–5.3)
Sodium: 142 mmol/L (ref 135–146)
Total Bilirubin: 0.5 mg/dL (ref 0.2–1.2)
Total Protein: 6.5 g/dL (ref 6.1–8.1)

## 2021-06-06 LAB — HEMOGLOBIN A1C
Hgb A1c MFr Bld: 5.7 % of total Hgb — ABNORMAL HIGH (ref <5.7)
Mean Plasma Glucose: 117 mg/dL
eAG (mmol/L): 6.5 mmol/L

## 2021-06-06 LAB — PSA: PSA: 1.16 ng/mL (ref <=4.00)

## 2021-06-06 LAB — TSH: TSH: 1.85 mIU/L (ref 0.40–4.50)

## 2021-06-06 MED ORDER — IRBESARTAN 75 MG PO TABS: 75.0000 mg | ORAL_TABLET | Freq: Every day | ORAL | 1 refills | Status: DC

## 2021-06-06 NOTE — Telephone Encounter (Signed)
Refill sent for 90 d supply with 1 refill, pt was last seen 06/05/21. Next f/u scheduled 09/2021. Pt advised of rx update

## 2021-06-06 NOTE — Telephone Encounter (Signed)
Patient refill request.  He is completely out of meds.  He stated [facility] has sent numerous requests for approval.  [facility] - [place]  irbesartan (AVAPRO) 75 MG tablet [[id number]]

## 2021-06-24 ENCOUNTER — Other Ambulatory Visit: Payer: Self-pay | Admitting: Family Medicine

## 2021-07-06 ENCOUNTER — Other Ambulatory Visit: Payer: Self-pay | Admitting: Family Medicine

## 2021-07-11 ENCOUNTER — Telehealth: Payer: Self-pay | Admitting: Family Medicine

## 2021-07-11 NOTE — Telephone Encounter (Signed)
Please let him know that I have reviewed download for the last 30 days. Apnea is well managed but he continues to have a leak. Did he change out his old mask? Doe she wish to have a mask refitting? If so I will be happy to send orders.

## 2021-07-11 NOTE — Telephone Encounter (Signed)
LVM for pt to call office back.

## 2021-07-24 ENCOUNTER — Other Ambulatory Visit: Payer: Self-pay | Admitting: Family Medicine

## 2021-09-04 ENCOUNTER — Ambulatory Visit: Payer: 59 | Admitting: Family Medicine

## 2021-09-21 ENCOUNTER — Other Ambulatory Visit: Payer: Self-pay | Admitting: Family Medicine

## 2021-09-24 ENCOUNTER — Telehealth: Payer: Self-pay | Admitting: Family Medicine

## 2021-09-24 MED ORDER — DAPAGLIFLOZIN PROPANEDIOL 10 MG PO TABS: 10.0000 mg | ORAL_TABLET | Freq: Every day | ORAL | 0 refills | Status: DC

## 2021-09-24 NOTE — Telephone Encounter (Signed)
Pt advised refill sent. He would like 90 d/s and on all meds in the future. Pt advised to fast for upcoming appt for possible labs

## 2021-09-24 NOTE — Telephone Encounter (Signed)
Pt needing refill on medication  Chana Bode 10 MG TABS tablet    [address] [place], [place] [address] Phone:  [phone]  Fax:  [phone]

## 2021-09-24 NOTE — Telephone Encounter (Signed)
Pt advised of refill

## 2021-10-08 ENCOUNTER — Ambulatory Visit: Payer: 59 | Admitting: Family Medicine

## 2021-10-08 ENCOUNTER — Other Ambulatory Visit: Payer: Self-pay | Admitting: Family Medicine

## 2021-10-12 ENCOUNTER — Encounter: Payer: Self-pay | Admitting: Family Medicine

## 2021-10-12 ENCOUNTER — Ambulatory Visit (INDEPENDENT_AMBULATORY_CARE_PROVIDER_SITE_OTHER): Payer: 59 | Admitting: Family Medicine

## 2021-10-12 ENCOUNTER — Other Ambulatory Visit: Payer: Self-pay

## 2021-10-12 VITALS — BP 112/72 | HR 64 | Temp 98.0°F | Ht 65.0 in | Wt 205.8 lb

## 2021-10-12 DIAGNOSIS — E785 Hyperlipidemia, unspecified: Secondary | ICD-10-CM | POA: Diagnosis not present

## 2021-10-12 DIAGNOSIS — M609 Myositis, unspecified: Secondary | ICD-10-CM | POA: Diagnosis not present

## 2021-10-12 DIAGNOSIS — T466X5A Adverse effect of antihyperlipidemic and antiarteriosclerotic drugs, initial encounter: Secondary | ICD-10-CM | POA: Diagnosis not present

## 2021-10-12 DIAGNOSIS — E119 Type 2 diabetes mellitus without complications: Secondary | ICD-10-CM

## 2021-10-12 DIAGNOSIS — I10 Essential (primary) hypertension: Secondary | ICD-10-CM

## 2021-10-12 MED ORDER — DAPAGLIFLOZIN PROPANEDIOL 10 MG PO TABS: 10.0000 mg | ORAL_TABLET | Freq: Every day | ORAL | 3 refills | Status: DC

## 2021-10-12 MED ORDER — IRBESARTAN 75 MG PO TABS: 75.0000 mg | ORAL_TABLET | Freq: Every day | ORAL | 1 refills | Status: DC

## 2021-10-12 MED ORDER — ALLOPURINOL 300 MG PO TABS: 300.0000 mg | ORAL_TABLET | Freq: Every day | ORAL | 3 refills | Status: DC

## 2021-10-12 MED ORDER — PIOGLITAZONE HCL 45 MG PO TABS: 45.0000 mg | ORAL_TABLET | Freq: Every day | ORAL | 3 refills | Status: DC

## 2021-10-12 NOTE — Progress Notes (Signed)
OFFICE VISIT  10/12/2021  CC:  Chief Complaint  Patient presents with   Follow-up    RCI, pt is fasting    HPI:    Patient is a 61 y.o. male who presents for 4 mo f/u DM, HTN, seasonal affective disorder. A/P as of last visit: "1) type II diabetes: No complications, well controlled.  Continue Farxiga 10 mg/day and pioglitazone 45 mg/day.  Checking hemoglobin A1c today.  2.  Hypertension, well controlled on irbesartan 75 mg daily. Electrolytes and creatinine checked today.  3.  Seasonal affect of disorder: He is doing much better over the last 5 to 6 months.  No medications at this time.   4. Taking "cholesoff" for years. Health maintenance exam: Reviewed age and gender appropriate health maintenance issues (prudent diet, regular exercise, health risks of tobacco and excessive alcohol, use of seatbelts, fire alarms in home, use of sunscreen).  Also reviewed age and gender appropriate health screening as well as vaccine recommendations. Vaccines: Prevnar 20->declined.  Shingrix-->declined.  Flu-->declined. Labs: fasting HP, Hba1c, and PSA ordered. Prostate ca screening: PSA today. Colon ca screening: recall 2026"  INTERIM HX: All labs excellent last visit. Hemoglobin A1c was 5.7%! His LDL rose a little to 130.  No med changes were made at that time.  Hyperextended both ankles when hunting 07/2021.  Swelled up/black and blue, all better except L bothers him just a little. Eating MUCH improved since the accident, walking for exercise more.  Has cut his irbesartan in half b/c bp's 371I systolic and he felt fatigued/HAs---feeling better and bp 112-118 over 70s.  No home glucose monitoring.  ROS as above, plus--> no fevers, no CP, no SOB, no wheezing, no cough, no dizziness, no HAs, no rashes, no melena/hematochezia.  No polyuria or polydipsia.  No myalgias or arthralgias.  No focal weakness, paresthesias, or tremors.  No acute vision or hearing abnormalities.  No dysuria or  unusual/new urinary urgency or frequency.  No recent changes in lower legs. No n/v/d or abd pain.  No palpitations.     Past Medical History:  Diagnosis Date   Asthma    "grew out of it", esp after quitting smoking arond age 89 yrs old   Atypical chest pain 06/12/2014   Carpal tunnel syndrome    bilat, L>>R (Dr. Vertell Limber).  Hand symptoms prompted referral to rheum--lab w/u NEG.  Wrist splinting x 6 wks advised, not much help so low dose prednisone rx'd, then pt had to get steroid injection into L carpal tunnel 12/2016.   Cervical myelopathy [facility])    Surgical fusion--Dr Vertell Limber (cerv spinal stenosis).  Stable as of 08/2019 and 10/2020 neurosurg f/u.   Chronic low back pain    COVID-19 virus infection 05/2020   Diabetes mellitus without complication ([place])    No diab retinop as of 12/2014   Gout    Knee and toe--allopurinol since approx 2012 and has had no flares since being on this med (1/2 of 300 mg tab qd)   History of hyperkalemia 2015/2016   low K diet; cut lisinopril from 10mg  to 5mg  qd 11/2014.   Hyperlipidemia    med x 1 trial=adverse side effects so he stopped it.  LDL 130s 08/2014, then dx'd with DM 2 shortly after, pt refused another trial of statin 05/2017 and 2020.   Hypertension    MDD (major depressive disorder), recurrent episode ([place])    initiated tx 09/2020   OSA (obstructive sleep apnea)    Remote past: had cpap but  didn't use much .  REPEAT HOME SLEEP STUDY 09/16/19-> severe OSA, pt got on CPAP after this.   Paresthesia of both hands    Dr. Vertell Limber: MRI 01/2018: no acute abnormality.  Stable-to-improved chronic spine/spinal cord changes, fusion good. MRI stable 10/2020->cont gabapentin hs   Umbilical hernia 5456    Past Surgical History:  Procedure Laterality Date   ANTERIOR CERVICAL DECOMP/DISCECTOMY FUSION N/A 03/07/2017   Procedure: Cervical four-five, Cervical five-six, Cerivcal six- seven Anterior cervical decompression/discectomy/fusion WITH CERVICAL FIVE COREPECTOMY;   Surgeon: Erline Levine, MD;  Location: [place];  Service: Neurosurgery;  Laterality: N/A;  C4-5 C5-6 C6-7 Anterior cervical decompression/discectomy/fusion   BACK SURGERY  12/05/2016   Lumbar, Dr. Vertell Limber at [facility] surgical center   COLONOSCOPY W/ POLYPECTOMY  10/13/14   Hyperplastic; recall 10 yrs   Sleep study  09/16/2019   Home sleep study--severe OSA->CPAP recommended   VASECTOMY     WISDOM TOOTH EXTRACTION      Outpatient Medications Prior to Visit  Medication Sig Dispense Refill   allopurinol (ZYLOPRIM) 300 MG tablet Take 1 tablet by mouth once daily 90 tablet 0   aspirin EC 81 MG tablet Take 81 mg by mouth daily.     dapagliflozin propanediol (FARXIGA) 10 MG TABS tablet Take 1 tablet (10 mg total) by mouth daily before breakfast. 90 tablet 0   irbesartan (AVAPRO) 75 MG tablet Take 1 tablet (75 mg total) by mouth daily. 90 tablet 1   Multiple Vitamins-Minerals (MENS MULTIVITAMIN) TABS Take 1 tablet by mouth daily.     pioglitazone (ACTOS) 45 MG tablet Take 1 tablet by mouth once daily 90 tablet 0   Plant Sterols and Stanols (CHOLESTOFF PO) Take by mouth daily.     Polyethyl Glycol-Propyl Glycol 0.4-0.3 % SOLN Place 1-2 drops into both eyes 4 (four) times daily as needed (for dry eyes.).     No facility-administered medications prior to visit.    Allergies  Allergen Reactions   Metformin And Related Diarrhea    Nausea and diarrhea   Prednisone Other (See Comments)    Headaches, muscle aches, fatigue   Lisinopril Cough    ROS As per HPI  PE: Vitals with BMI 10/12/2021 06/05/2021 05/07/2021  Height 5\' 5"  5\' 5"  5\' 5"   Weight 205 lbs 13 oz 208 lbs 3 oz 207 lbs 8 oz  BMI 34.25 25.63 89.37  Systolic 342 876 811  Diastolic 72 61 62  Pulse 64 66 56   Physical Exam  Gen: Alert, well appearing.  Patient is oriented to person, place, time, and situation. AFFECT: pleasant, lucid thought and speech. CV: RRR, no m/r/g.   LUNGS: CTA bilat, nonlabored resps, good aeration in all  lung fields. EXT: no clubbing or cyanosis.  no edema.  Ankles: No erythema or swelling.  No excess laxity.  Minimal discomfort to palpation over the left ATFL  LABS:  Last CBC Lab Results  Component Value Date   WBC 6.1 06/05/2021   HGB 15.5 06/05/2021   HCT 46.9 06/05/2021   MCV 91.8 06/05/2021   MCH 30.3 06/05/2021   RDW 14.0 06/05/2021   PLT 178 57/26/2035   Last metabolic panel Lab Results  Component Value Date   GLUCOSE 139 (H) 06/05/2021   NA 142 06/05/2021   K 4.9 06/05/2021   CL 110 06/05/2021   CO2 21 06/05/2021   BUN 27 (H) 06/05/2021   CREATININE 1.17 06/05/2021   GFRNONAA >60 03/05/2017   CALCIUM 9.4 06/05/2021   PROT 6.5  06/05/2021   ALBUMIN 4.7 03/18/2019   BILITOT 0.5 06/05/2021   ALKPHOS 77 03/18/2019   AST 13 06/05/2021   ALT 11 06/05/2021   ANIONGAP 8 03/05/2017   Last lipids Lab Results  Component Value Date   CHOL 216 (H) 06/05/2021   HDL 61 06/05/2021   LDLCALC 130 (H) 06/05/2021   TRIG 140 06/05/2021   CHOLHDL 3.5 06/05/2021   Last hemoglobin A1c Lab Results  Component Value Date   HGBA1C 5.7 (H) 06/05/2021   Last thyroid functions Lab Results  Component Value Date   TSH 1.85 06/05/2021   Last vitamin D No results found for: 25OHVITD2, 25OHVITD3, VD25OH Last vitamin B12 and Folate Lab Results  Component Value Date   [id number] 153 08/08/2015   Lab Results  Component Value Date   LABURIC 5.8 06/01/2014   IMPRESSION AND PLAN:  #1 diabetes.  Continue pioglitazone 45 mg a day and Farxiga 10 mg/day. Hemoglobin A1c and fasting glucose today.  2.  Essential hypertension.  Well-controlled on just half of the 75 mg irbesartan daily. Electrolytes and creatinine today.  3.  Hyperlipidemia, patient intolerant of multiple statins and declines further trials.  #4 seasonal affective disorder: Doing well.  No meds at this time.  An After Visit Summary was printed and given to the patient.  FOLLOW UP: No follow-ups on file.  Crissie Sickles, MD           10/12/2021

## 2021-10-13 LAB — HEMOGLOBIN A1C
Hgb A1c MFr Bld: 5.5 % of total Hgb (ref <5.7)
Mean Plasma Glucose: 111 mg/dL
eAG (mmol/L): 6.2 mmol/L

## 2021-10-13 LAB — BASIC METABOLIC PANEL
BUN/Creatinine Ratio: 25 (calc) — ABNORMAL HIGH (ref 6–22)
BUN: 30 mg/dL — ABNORMAL HIGH (ref 7–25)
CO2: 25 mmol/L (ref 20–32)
Calcium: 9.8 mg/dL (ref 8.6–10.3)
Chloride: 105 mmol/L (ref 98–110)
Creat: 1.18 mg/dL (ref 0.70–1.35)
Glucose, Bld: 98 mg/dL (ref 65–99)
Potassium: 5.6 mmol/L — ABNORMAL HIGH (ref 3.5–5.3)
Sodium: 139 mmol/L (ref 135–146)

## 2021-10-16 ENCOUNTER — Telehealth: Payer: Self-pay

## 2021-10-16 DIAGNOSIS — E875 Hyperkalemia: Secondary | ICD-10-CM

## 2021-10-16 NOTE — Telephone Encounter (Signed)
-----   Message from Tammi Sou, MD sent at 10/14/2021  3:31 PM EST ----- Potassium slightly elevated. Decrease irbesartan to 1/2 tab daily. Nonfasting lab for rpt bmet 7-10d, dx hyperkalemia.

## 2021-10-23 ENCOUNTER — Ambulatory Visit (INDEPENDENT_AMBULATORY_CARE_PROVIDER_SITE_OTHER): Payer: 59

## 2021-10-23 ENCOUNTER — Other Ambulatory Visit: Payer: Self-pay

## 2021-10-23 DIAGNOSIS — E875 Hyperkalemia: Secondary | ICD-10-CM | POA: Diagnosis not present

## 2021-10-24 LAB — BASIC METABOLIC PANEL
BUN: 17 mg/dL (ref 7–25)
CO2: 24 mmol/L (ref 20–32)
Calcium: 9.5 mg/dL (ref 8.6–10.3)
Chloride: 107 mmol/L (ref 98–110)
Creat: 1.02 mg/dL (ref 0.70–1.35)
Glucose, Bld: 132 mg/dL — ABNORMAL HIGH (ref 65–99)
Potassium: 4.4 mmol/L (ref 3.5–5.3)
Sodium: 140 mmol/L (ref 135–146)

## 2021-10-24 NOTE — Progress Notes (Signed)
Great!

## 2021-12-18 ENCOUNTER — Telehealth: Payer: Self-pay

## 2021-12-18 ENCOUNTER — Other Ambulatory Visit: Payer: Self-pay

## 2021-12-18 NOTE — Telephone Encounter (Addendum)
Derrick Willis (Key: [id number]) ?Rx #: [id number] ?Farxiga '10MG'$  tablets ? ?As long as you remain covered by your prescription drug plan and there are no changes to your ?plan benefits, this request is approved from 12/18/2021 to 12/19/2022 ? ?Pt was notified. ?

## 2021-12-18 NOTE — Telephone Encounter (Signed)
Pt wanted to ask if provider would take over med mgmt for gabapentin. Old provider that used to prescribe for neuropathy has retired.  ? ?Please review and advise ? ?

## 2021-12-19 MED ORDER — GABAPENTIN 600 MG PO TABS: 600.0000 mg | ORAL_TABLET | Freq: Two times a day (BID) | ORAL | 1 refills | Status: DC

## 2021-12-19 NOTE — Telephone Encounter (Signed)
Pt advised refill sent. °

## 2022-02-08 ENCOUNTER — Encounter: Payer: Self-pay | Admitting: Family Medicine

## 2022-02-08 ENCOUNTER — Ambulatory Visit (INDEPENDENT_AMBULATORY_CARE_PROVIDER_SITE_OTHER): Payer: 59 | Admitting: Family Medicine

## 2022-02-08 VITALS — BP 121/75 | HR 59 | Temp 98.5°F | Ht 65.0 in | Wt 199.2 lb

## 2022-02-08 DIAGNOSIS — E119 Type 2 diabetes mellitus without complications: Secondary | ICD-10-CM | POA: Diagnosis not present

## 2022-02-08 DIAGNOSIS — I10 Essential (primary) hypertension: Secondary | ICD-10-CM | POA: Diagnosis not present

## 2022-02-08 LAB — POCT GLYCOSYLATED HEMOGLOBIN (HGB A1C)
HbA1c POC (<> result, manual entry): 6.2 % (ref 4.0–5.6)
HbA1c, POC (controlled diabetic range): 6.2 % (ref 0.0–7.0)
HbA1c, POC (prediabetic range): 6.2 % (ref 5.7–6.4)
Hemoglobin A1C: 6.2 % — AB (ref 4.0–5.6)

## 2022-02-08 MED ORDER — IRBESARTAN 75 MG PO TABS
75.0000 mg | ORAL_TABLET | Freq: Every day | ORAL | 1 refills | Status: DC
Start: 2022-02-08 — End: 2022-10-21

## 2022-02-08 NOTE — Progress Notes (Signed)
OFFICE VISIT  02/08/2022  CC:  Chief Complaint  Patient presents with   Hypertension   Diabetes    Pt is fasting. POC a1c completed    Patient is a 61 y.o. male who presents for 4 mo f/u DM and HTN. A/P as of last visit: "#1 diabetes.  Continue pioglitazone 45 mg a day and Farxiga 10 mg/day. Hemoglobin A1c and fasting glucose today.  2.  Essential hypertension.  Well-controlled on just half of the 75 mg irbesartan daily. Electrolytes and creatinine today.  3.  Hyperlipidemia, patient intolerant of multiple statins and declines further trials.   #4 seasonal affective disorder: Doing well.  No meds at this time.    INTERIM HX: Feeling well. Starting to get into a season. 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  Home blood pressures consistently normal. Not checking glucoses.  He admits his diet is not nearly as good as usual lately and he was not very active in the winter and early spring.  ROS as above, plus--> no fevers, no CP, no SOB, no wheezing, no cough, no dizziness, no HAs, no rashes, no melena/hematochezia.  No polyuria or polydipsia.  No myalgias or arthralgias.  No focal weakness, paresthesias, or tremors.  No acute vision or hearing abnormalities.  No dysuria or unusual/new urinary urgency or frequency.  No recent changes in lower legs. No n/v/d or abd pain.  No palpitations.     Past Medical History:  Diagnosis Date   Asthma    "grew out of it", esp after quitting smoking arond age 42 yrs old   Atypical chest pain 06/12/2014   Carpal tunnel syndrome    bilat, L>>R (Dr. Vertell Limber).  Hand symptoms prompted referral to rheum--lab w/u NEG.  Wrist splinting x 6 wks advised, not much help so low dose prednisone rx'd, then pt had to get steroid injection into L carpal tunnel 12/2016.   Cervical myelopathy [facility])    Surgical fusion--Dr Vertell Limber (cerv spinal stenosis).  Stable as of 08/2019 and 10/2020 neurosurg f/u.   Chronic low back pain    COVID-19 virus infection  05/2020   Diabetes mellitus without complication ([place])    No diab retinop as of 12/2014   Gout    Knee and toe--allopurinol since approx 2012 and has had no flares since being on this med (1/2 of 300 mg tab qd)   History of hyperkalemia 2015/2016   low K diet; cut lisinopril from '10mg'$  to '5mg'$  qd 11/2014.   Hyperlipidemia    med x 1 trial=adverse side effects so he stopped it.  LDL 130s 08/2014, then dx'd with DM 2 shortly after, pt refused another trial of statin 05/2017 and 2020.   Hypertension    MDD (major depressive disorder), recurrent episode ([place])    initiated tx 09/2020   OSA (obstructive sleep apnea)    Remote past: had cpap but didn't use much .  REPEAT HOME SLEEP STUDY 09/16/19-> severe OSA, pt got on CPAP after this.   Paresthesia of both hands    Dr. Vertell Limber: MRI 01/2018: no acute abnormality.  Stable-to-improved chronic spine/spinal cord changes, fusion good. MRI stable 10/2020->cont gabapentin hs   Statin intolerance    Umbilical hernia 8413    Past Surgical History:  Procedure Laterality Date   ANTERIOR CERVICAL DECOMP/DISCECTOMY FUSION N/A 03/07/2017   Procedure: Cervical four-five, Cervical five-six, Cerivcal six- seven Anterior cervical decompression/discectomy/fusion WITH CERVICAL FIVE COREPECTOMY;  Surgeon: Erline Levine, MD;  Location: [facility];  Service: Neurosurgery;  Laterality: N/A;  C4-5 C5-6 C6-7 Anterior cervical decompression/discectomy/fusion   BACK SURGERY  12/05/2016   Lumbar, Dr. Vertell Limber at [facility]   COLONOSCOPY W/ POLYPECTOMY  10/13/14   Hyperplastic; recall 10 yrs   Sleep study  09/16/2019   Home sleep study--severe OSA->CPAP recommended   VASECTOMY     WISDOM TOOTH EXTRACTION      Outpatient Medications Prior to Visit  Medication Sig Dispense Refill   allopurinol (ZYLOPRIM) 300 MG tablet Take 1 tablet (300 mg total) by mouth daily. 90 tablet 3   aspirin EC 81 MG tablet Take 81 mg by mouth daily.     dapagliflozin propanediol (FARXIGA) 10 MG  TABS tablet Take 1 tablet (10 mg total) by mouth daily before breakfast. 90 tablet 3   gabapentin (NEURONTIN) 600 MG tablet Take 1 tablet (600 mg total) by mouth 2 (two) times daily. 180 tablet 1   irbesartan (AVAPRO) 75 MG tablet Take 1 tablet (75 mg total) by mouth daily. 90 tablet 1   Multiple Vitamins-Minerals (MENS MULTIVITAMIN) TABS Take 1 tablet by mouth daily.     pioglitazone (ACTOS) 45 MG tablet Take 1 tablet (45 mg total) by mouth daily. 90 tablet 3   Plant Sterols and Stanols (CHOLESTOFF PO) Take by mouth daily.     Polyethyl Glycol-Propyl Glycol 0.4-0.3 % SOLN Place 1-2 drops into both eyes 4 (four) times daily as needed (for dry eyes.).     No facility-administered medications prior to visit.    Allergies  Allergen Reactions   Metformin And Related Diarrhea    Nausea and diarrhea   Prednisone Other (See Comments)    Headaches, muscle aches, fatigue   Lisinopril Cough    ROS As per HPI  PE:    02/08/2022    8:33 AM 10/12/2021    8:18 AM 06/05/2021    8:17 AM  Vitals with BMI  Height '5\' 5"'$  '5\' 5"'$  '5\' 5"'$   Weight 199 lbs 3 oz 205 lbs 13 oz 208 lbs 3 oz  BMI 33.15 69.48 54.62  Systolic 703 500 938  Diastolic 75 72 61  Pulse 59 64 66     Physical Exam  Gen: Alert, well appearing.  Patient is oriented to person, place, time, and situation. AFFECT: pleasant, lucid thought and speech. CV: RRR, no m/r/g.   LUNGS: CTA bilat, nonlabored resps, good aeration in all lung fields. EXT: no clubbing or cyanosis.  no edema.    LABS:  Last CBC Lab Results  Component Value Date   WBC 6.1 06/05/2021   HGB 15.5 06/05/2021   HCT 46.9 06/05/2021   MCV 91.8 06/05/2021   MCH 30.3 06/05/2021   RDW 14.0 06/05/2021   PLT 178 18/29/9371   Last metabolic panel Lab Results  Component Value Date   GLUCOSE 132 (H) 10/23/2021   NA 140 10/23/2021   K 4.4 10/23/2021   CL 107 10/23/2021   CO2 24 10/23/2021   BUN 17 10/23/2021   CREATININE 1.02 10/23/2021   GFRNONAA >60  03/05/2017   CALCIUM 9.5 10/23/2021   PROT 6.5 06/05/2021   ALBUMIN 4.7 03/18/2019   BILITOT 0.5 06/05/2021   ALKPHOS 77 03/18/2019   AST 13 06/05/2021   ALT 11 06/05/2021   ANIONGAP 8 03/05/2017   Last lipids Lab Results  Component Value Date   CHOL 216 (H) 06/05/2021   HDL 61 06/05/2021   LDLCALC 130 (H) 06/05/2021   TRIG 140 06/05/2021   CHOLHDL 3.5 06/05/2021   Last  hemoglobin A1c Lab Results  Component Value Date   HGBA1C 5.5 10/12/2021   Last thyroid functions Lab Results  Component Value Date   TSH 1.85 06/05/2021   POC Hba1c today is 6.2%  IMPRESSION AND PLAN:  1) Type 2 diabetes, good control but a little worse compared to 4 months ago.   POC Hba1c today is 6.2%. Continue pioglitazone 45 mg a day and Farxiga 10 mg a day. He will work on TLC improvements  2) hypertension, well controlled on irbesartan 75 mg a day. Electrolytes, kidney function, and hepatic testing have all been normal consistently.  Will defer repeat of these until next visit in 4 months  An After Visit Summary was printed and given to the patient.  FOLLOW UP: Return in about 4 months (around 06/10/2022) for annual CPE (fasting. Next cpe 05/2022 Signed:  Crissie Sickles, MD           02/08/2022

## 2022-02-14 DIAGNOSIS — H0288B Meibomian gland dysfunction left eye, upper and lower eyelids: Secondary | ICD-10-CM | POA: Diagnosis not present

## 2022-02-14 DIAGNOSIS — E119 Type 2 diabetes mellitus without complications: Secondary | ICD-10-CM | POA: Diagnosis not present

## 2022-02-14 DIAGNOSIS — H2513 Age-related nuclear cataract, bilateral: Secondary | ICD-10-CM | POA: Diagnosis not present

## 2022-02-14 DIAGNOSIS — H0288A Meibomian gland dysfunction right eye, upper and lower eyelids: Secondary | ICD-10-CM | POA: Diagnosis not present

## 2022-02-14 LAB — HM DIABETES EYE EXAM

## 2022-05-07 NOTE — Progress Notes (Signed)
PATIENT: Derrick Willis DOB: 02-26-1961  REASON FOR VISIT: follow up HISTORY FROM: patient  Chief Complaint  Patient presents with   Follow-up    Pt in rm #1 and alone. Pt state since last visit things have been good.      HISTORY OF PRESENT ILLNESS:  05/09/22 ALL: Derrick Willis returns for follow up for OSA on CPAP. He continues to do well. He is using CPAP nightly for about 6.5 hr. He admits that he may not use if out of town over night. He does take CPAP on longer trips. He changed to a nasal mask and feels it fits better. Leak has improved. He does note improvement in sleep quality and daytime energy when using CPAP.     05/07/2021 ALL: Derrick Willis returns for follow up for OSA on CPAP. He reports doing well. He is using CPAP most every night. He does note significant benefit when using CPAP. He does report needing to change his mask. He can tell that air is leaking when he wakes in the mornings. Otherwise, he is feeling well and without complaints.     05/04/2020 ALL:  Derrick Willis is a 61 y.o. male here today for follow up for OSA on CPAP.   Compliance report dated 04/03/2020 through 05/02/2020 reveals that he used CPAP 27 of the past 30 days for compliance of 90%.  He is CPAP greater than 4 hours 27 of the past 30 days for compliance of 90%.  Average usage on days used was 6 hours and 38 minutes.  Residual AHI was 0.6 on 6 to 18 cm of water and an EPR of 2.  There was no significant leak noted.  HISTORY: (copied from my note on 11/02/2019)  Derrick Willis is a 61 y.o. male here today for follow up OSA recently started on CPAP therapy. HST revealed total AHI of 31.5/hr and REM sleep AHI of 52.3/hr. No prolonged hypoxemia noted.  Derrick Willis reports that he is doing very well on CPAP therapy.  He notes significant improvement in sleep quality and feels less fatigued throughout the day.  He is now sleeping through the night without waking to use the restroom.  He denies any difficulty with his  machine.   Compliance report dated 10/02/2019 through 10/31/2019 reveals that he is used CPAP 30 of the last 30 days for compliance of 100%.  He used CPAP greater than 4 hours all 30 days for compliance of 100%.  Average usage was 6 hours and 35 minutes.  Residual AHI was 1.2 on 6 to 18 cm of water and an EPR of 2.  There was no significant leak noted.   HISTORY: (copied from  note on 09/06/2019)   Derrick Willis is a 61 year old Caucasian male patient seen upon referral on 09/06/2019 .  Chief concern according to patient :  I have a CPAP from years ago, but it didn't do me any good. I had the study in [place], [place] at [facility].  The machine was mailed to me and I never followed up.    Derrick Willis had childhood asthma it lasted probably through his 73s as long as he was smoking which she quit around age 40.  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   He has a history of carpal tunnel syndrome surgically treated by Dr. Vertell Limber 2018 cervical myelopathy and surgical fusion December 2019 by Dr. Vertell Limber, lower back degenerative disease.  Gout affecting knee and toe, hyperkalemia, hyperlipidemia, hypertension, in June 2019 Dr. Joaquim Lai evaluated him for paresthesias of both hands.  Is also history of umbilical hernia in 9326.  And diabetes mellitus.   DOT evaluation questioned his lack of CPAP use and asked for re-evaluation. He is also seeing Dr Vertell Limber next week.     Sleep relevant medical history: Nocturia- one, no Tonsillectomy , had cervical spine surgery, but no  deviated septum or sinus surgery, no UPPP.   he  had tubes in his ears at kindergarten age.  Family medical /sleep history:  other family member on CPAP with OSA, insomnia, sleep walkers.    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 and lives in a household alone with his dog.  He is divorced, his wife reportedly was very bothered by his snoring.  He has 2 adult sons, one in DO school.  The patient currently works. Tobacco use; quit at age 60 .   ETOH use - 2-3 drinks a week, Caffeine intake in form of Coffee( part decaffeinated 4-6 cups a day ) Soda( none ) Tea ( once I a while ) or energy drinks. Regular exercise in form of walking     Sleep habits are as follows: The patient's dinner time is between 6-8  PM. The patient goes to bed at 10 PM and continues to sleep for many hours,. The preferred sleep position is sideways, with the support of 2 pillows. Dreams are reportedly frequent.  6 AM is the usual rise time. The patient wakes up spontaneously at 5-6 AM He reports  feeling refreshed or restored in AM, with symptoms such as dry mouth, but no morning headaches, and only mild residual fatigue.  Naps are taken in frequently    REVIEW OF SYSTEMS: Out of a complete 14 system review of symptoms, the patient complains only of the following symptoms, none and all other reviewed systems are negative.  ESS: 4/24, previously 7/24   ALLERGIES: Allergies  Allergen Reactions   Metformin And Related Diarrhea    Nausea and diarrhea   Prednisone Other (See Comments)    Headaches, muscle aches, fatigue   Lisinopril Cough    HOME MEDICATIONS: Outpatient Medications Prior to Visit  Medication Sig Dispense Refill   allopurinol (ZYLOPRIM) 300 MG tablet Take 1 tablet (300 mg total) by mouth daily. 90 tablet 3   aspirin EC 81 MG tablet Take 81 mg by mouth daily.     dapagliflozin propanediol (FARXIGA) 10 MG TABS tablet Take 1 tablet (10 mg total) by mouth daily before breakfast. 90 tablet 3    gabapentin (NEURONTIN) 600 MG tablet Take 1 tablet (600 mg total) by mouth 2 (two) times daily. 180 tablet 1   irbesartan (AVAPRO) 75 MG tablet Take 1 tablet (75 mg total) by mouth daily. 90 tablet 1   Multiple Vitamins-Minerals (MENS MULTIVITAMIN) TABS Take 1 tablet by mouth daily.     pioglitazone (ACTOS) 45 MG tablet Take 1 tablet (45 mg total) by mouth daily. 90 tablet 3   Plant Sterols and Stanols (CHOLESTOFF PO) Take by mouth daily.     Polyethyl Glycol-Propyl Glycol  0.4-0.3 % SOLN Place 1-2 drops into both eyes 4 (four) times daily as needed (for dry eyes.).     No facility-administered medications prior to visit.    PAST MEDICAL HISTORY: Past Medical History:  Diagnosis Date   Asthma    "grew out of it", esp after quitting smoking arond age 64 yrs old   Atypical chest pain 06/12/2014   Carpal tunnel syndrome    bilat, L>>R (Dr. Vertell Limber).  Hand symptoms prompted referral to rheum--lab w/u NEG.  Wrist splinting x 6 wks advised, not much help so low dose prednisone rx'd, then pt had to get steroid injection into L carpal tunnel 12/2016.   Cervical myelopathy [facility])    Surgical fusion--Dr Vertell Limber (cerv spinal stenosis).  Stable as of 08/2019 and 10/2020 neurosurg f/u.   Chronic low back pain    COVID-19 virus infection 05/2020   Diabetes mellitus without complication ([place])    No diab retinop as of 12/2014   Gout    Knee and toe--allopurinol since approx 2012 and has had no flares since being on this med (1/2 of 300 mg tab qd)   History of hyperkalemia 2015/2016   low K diet; cut lisinopril from '10mg'$  to '5mg'$  qd 11/2014.   Hyperlipidemia    med x 1 trial=adverse side effects so he stopped it.  LDL 130s 08/2014, then dx'd with DM 2 shortly after, pt refused another trial of statin 05/2017 and 2020.   Hypertension    MDD (major depressive disorder), recurrent episode ([place])    initiated tx 09/2020   OSA (obstructive sleep apnea)    Remote past: had cpap but didn't use much .  REPEAT HOME SLEEP  STUDY 09/16/19-> severe OSA, pt got on CPAP after this.   Paresthesia of both hands    Dr. Vertell Limber: MRI 01/2018: no acute abnormality.  Stable-to-improved chronic spine/spinal cord changes, fusion good. MRI stable 10/2020->cont gabapentin hs   Statin intolerance    Umbilical hernia 5465    PAST SURGICAL HISTORY: Past Surgical History:  Procedure Laterality Date   ANTERIOR CERVICAL DECOMP/DISCECTOMY FUSION N/A 03/07/2017   Procedure: Cervical four-five, Cervical five-six, Cerivcal six- seven Anterior cervical decompression/discectomy/fusion WITH CERVICAL FIVE COREPECTOMY;  Surgeon: Erline Levine, MD;  Location: [facility];  Service: Neurosurgery;  Laterality: N/A;  C4-5 C5-6 C6-7 Anterior cervical decompression/discectomy/fusion   BACK SURGERY  12/05/2016   Lumbar, Dr. Vertell Limber at [facility]   COLONOSCOPY W/ POLYPECTOMY  10/13/14   Hyperplastic; recall 10 yrs   Sleep study  09/16/2019   Home sleep study--severe OSA->CPAP recommended   VASECTOMY     WISDOM TOOTH EXTRACTION      FAMILY HISTORY: Family History  Problem Relation Age of Onset   Arthritis Mother    Arthritis Father    Hyperlipidemia Father    Hypertension Father    Colon polyps Neg Hx    Esophageal cancer Neg Hx    Rectal cancer Neg Hx    Stomach cancer Neg Hx     SOCIAL HISTORY: Social History   Socioeconomic History   Marital status: Divorced    Spouse name: Not on file   Number of children: Not on file   Years of education: Not on file   Highest education level: Not on file  Occupational History   Not on file  Tobacco Use   Smoking status: Former    Packs/day: 1.50    Years: 12.00    Total pack years: 18.00    Types: Cigars, Cigarettes  Quit date: 05/31/1990    Years since quitting: 31.9   Smokeless tobacco: Never   Tobacco comments:    Quit smoking Cigars 10/2015  Vaping Use   Vaping Use: Never used  Substance and Sexual Activity   Alcohol use: Yes    Alcohol/week: 1.0 - 2.0 standard drink  of alcohol    Types: 1 - 2 Glasses of wine per week    Comment: 2-3 x weekly   Drug use: No   Sexual activity: Not on file  Other Topics Concern   Not on file  Social History Narrative   Divorced.  Two children, both young adults (one son plans to go to med school).   Occupation: Grading contractor-also a Psychologist, sport and exercise.   Education: 2 years of college.   Orig from [place], still lives there.   Tob 30 pack-yr hx, quit around age 32.   Social drinker.     No regular exercise.  Eating habits poor.   Social Determinants of Health   Financial Resource Strain: Not on file  Food Insecurity: Not on file  Transportation Needs: Not on file  Physical Activity: Not on file  Stress: Not on file  Social Connections: Not on file  Intimate Partner Violence: Not on file      PHYSICAL EXAM  Vitals:   05/09/22 0753  BP: (!) 145/70  Pulse: (!) 56  Weight: 199 lb 12.8 oz (90.6 kg)  Height: '5\' 5"'$  (1.651 m)     Body mass index is 33.25 kg/m.  Generalized: Well developed, in no acute distress  Cardiology: normal rate and rhythm, no murmur noted Respiratory: clear to auscultation bilaterally  Neurological examination  Mentation: Alert oriented to time, place, history taking. Follows all commands speech and language fluent Cranial nerve II-XII: Pupils were equal round reactive to light. Extraocular movements were full, visual field were full  Motor: The motor testing reveals 5 over 5 strength of all 4 extremities. Good symmetric motor tone is noted throughout.  Gait and station: Gait is normal.    DIAGNOSTIC DATA (LABS, IMAGING, TESTING) - I reviewed patient records, labs, notes, testing and imaging myself where available.      No data to display           Lab Results  Component Value Date   WBC 6.1 06/05/2021   HGB 15.5 06/05/2021   HCT 46.9 06/05/2021   MCV 91.8 06/05/2021   PLT 178 06/05/2021      Component Value Date/Time   NA 140 10/23/2021 0932   K 4.4 10/23/2021 0932    CL 107 10/23/2021 0932   CO2 24 10/23/2021 0932   GLUCOSE 132 (H) 10/23/2021 0932   BUN 17 10/23/2021 0932   CREATININE 1.02 10/23/2021 0932   CALCIUM 9.5 10/23/2021 0932   PROT 6.5 06/05/2021 0852   ALBUMIN 4.7 03/18/2019 0826   AST 13 06/05/2021 0852   ALT 11 06/05/2021 0852   ALKPHOS 77 03/18/2019 0826   BILITOT 0.5 06/05/2021 0852   GFRNONAA >60 03/05/2017 0951   GFRAA >60 03/05/2017 0951   Lab Results  Component Value Date   CHOL 216 (H) 06/05/2021   HDL 61 06/05/2021   LDLCALC 130 (H) 06/05/2021   TRIG 140 06/05/2021   CHOLHDL 3.5 06/05/2021   Lab Results  Component Value Date   HGBA1C 6.2 (A) 02/08/2022   HGBA1C 6.2 02/08/2022   HGBA1C 6.2 02/08/2022   HGBA1C 6.2 02/08/2022   Lab Results  Component Value Date   [id number]  08/08/2015   Lab Results  Component Value Date   TSH 1.85 06/05/2021       ASSESSMENT AND PLAN 61 y.o. year old male  has a past medical history of Asthma, Atypical chest pain (06/12/2014), Carpal tunnel syndrome, Cervical myelopathy ([place]), Chronic low back pain, COVID-19 virus infection (05/2020), Diabetes mellitus without complication ([place]), Gout, History of hyperkalemia (2015/2016), Hyperlipidemia, Hypertension, MDD (major depressive disorder), recurrent episode ([place]), OSA (obstructive sleep apnea), Paresthesia of both hands, Statin intolerance, and Umbilical hernia (2330). here with     ICD-10-CM   1. OSA on CPAP  G47.33 For home use only DME continuous positive airway pressure (CPAP)   Z99.89       Derrick Willis is doing very well on CPAP therapy.  Compliance report reveals optimal compliance.  He was encouraged to continue using CPAP nightly and for greater than 4 hours each night. Supply orders updated. Healthy lifestyle habits encouraged.  He will follow-up with primary care as directed.  He will return to see [place] in 1 year, sooner if needed.  He verbalizes understanding and agreement with this plan.   Orders Placed This Encounter   Procedures   For home use only DME continuous positive airway pressure (CPAP)    Supplies    Order Specific Question:   Length of Need    Answer:   Lifetime    Order Specific Question:   Patient has OSA or probable OSA    Answer:   Yes    Order Specific Question:   Is the patient currently using CPAP in the home    Answer:   Yes    Order Specific Question:   Settings    Answer:   Other see comments    Order Specific Question:   CPAP supplies needed    Answer:   Mask, headgear, cushions, filters, heated tubing and water chamber      No orders of the defined types were placed in this encounter.      Debbora Presto, FNP-C 05/09/2022, 8:16 AM [facility] [address] [place], [place] [address] [phone]

## 2022-05-07 NOTE — Patient Instructions (Signed)

## 2022-05-09 ENCOUNTER — Encounter: Payer: Self-pay | Admitting: Family Medicine

## 2022-05-09 ENCOUNTER — Ambulatory Visit: Payer: 59 | Admitting: Family Medicine

## 2022-05-09 VITALS — BP 145/70 | HR 56 | Ht 65.0 in | Wt 199.8 lb

## 2022-05-09 DIAGNOSIS — G4733 Obstructive sleep apnea (adult) (pediatric): Secondary | ICD-10-CM

## 2022-05-09 DIAGNOSIS — Z9989 Dependence on other enabling machines and devices: Secondary | ICD-10-CM

## 2022-06-10 ENCOUNTER — Encounter: Payer: 59 | Admitting: Family Medicine

## 2022-07-01 ENCOUNTER — Encounter: Payer: 59 | Admitting: Family Medicine

## 2022-07-12 NOTE — Progress Notes (Signed)
[facility] Quality Team Note  Name: Tyon K Zaremba Date of Birth: 12/31/1960 MRN: [id number] Date: 07/12/2022  [facility] Quality Team has reviewed this patient's chart, please see recommendations below:  [facility] Quality Other; (KED: Kidney Health Evaluation Gap- Patient needs a Urine Albumin Creatinine Ratio Test and EGFR completed for gap closure. Patient has upcoming appointment with [facility] on 08/06/2022).  

## 2022-08-06 ENCOUNTER — Ambulatory Visit (INDEPENDENT_AMBULATORY_CARE_PROVIDER_SITE_OTHER): Payer: 59 | Admitting: Family Medicine

## 2022-08-06 ENCOUNTER — Encounter: Payer: Self-pay | Admitting: Family Medicine

## 2022-08-06 VITALS — BP 126/77 | HR 60 | Temp 99.0°F | Ht 65.5 in | Wt 203.2 lb

## 2022-08-06 DIAGNOSIS — I10 Essential (primary) hypertension: Secondary | ICD-10-CM | POA: Diagnosis not present

## 2022-08-06 DIAGNOSIS — E119 Type 2 diabetes mellitus without complications: Secondary | ICD-10-CM | POA: Diagnosis not present

## 2022-08-06 DIAGNOSIS — E7800 Pure hypercholesterolemia, unspecified: Secondary | ICD-10-CM | POA: Diagnosis not present

## 2022-08-06 DIAGNOSIS — Z0000 Encounter for general adult medical examination without abnormal findings: Secondary | ICD-10-CM | POA: Diagnosis not present

## 2022-08-06 DIAGNOSIS — Z125 Encounter for screening for malignant neoplasm of prostate: Secondary | ICD-10-CM

## 2022-08-06 LAB — POCT GLYCOSYLATED HEMOGLOBIN (HGB A1C)
HbA1c POC (<> result, manual entry): 6.7 % (ref 4.0–5.6)
HbA1c, POC (controlled diabetic range): 6.7 % (ref 0.0–7.0)
HbA1c, POC (prediabetic range): 6.7 % — AB (ref 5.7–6.4)
Hemoglobin A1C: 6.7 % — AB (ref 4.0–5.6)

## 2022-08-06 NOTE — Progress Notes (Signed)
Office Note 08/06/2022  CC:  Chief Complaint  Patient presents with   Annual Exam    Pt is fasting    HPI:  Patient is a 61 y.o. male who is here for annual health maintenance exam and 20-monthfollow-up diabetes and hypertension.  Overall doing well. Has not been eating is quite healthy diet is the usually does.  Also, not walking is much as usual.  Has been busy trying to get his mom into a new addition at his house.  Past Medical History:  Diagnosis Date   Asthma    "grew out of it", esp after quitting smoking arond age [age]yrs old   Atypical chest pain 06/12/2014   Carpal tunnel syndrome    bilat, L>>R (Dr. SVertell Limber.  Hand symptoms prompted referral to rheum--lab w/u NEG.  Wrist splinting x 6 wks advised, not much help so low dose prednisone rx'd, then pt had to get steroid injection into L carpal tunnel 12/2016.   Cervical myelopathy ([facility]    Surgical fusion--Dr SVertell Limber(cerv spinal stenosis).  Stable as of 08/2019 and 10/2020 neurosurg f/u.   Chronic low back pain    COVID-19 virus infection 05/2020   Diabetes mellitus without complication ([facility]    No diab retinop as of 12/2014   Gout    Knee and toe--allopurinol since approx 2012 and has had no flares since being on this med (1/2 of 300 mg tab qd)   History of hyperkalemia 2015/2016   low K diet; cut lisinopril from '10mg'$  to '5mg'$  qd 11/2014.   Hyperlipidemia    med x 1 trial=adverse side effects so he stopped it.  LDL 130s 08/2014, then dx'd with DM 2 shortly after, pt refused another trial of statin 05/2017 and 2020.   Hypertension    MDD (major depressive disorder), recurrent episode [place]    initiated tx 09/2020   OSA (obstructive sleep apnea)    Remote past: had cpap but didn't use much .  REPEAT HOME SLEEP STUDY 09/16/19-> severe OSA, pt got on CPAP after this.   Paresthesia of both hands    Dr. SVertell Limber MRI 01/2018: no acute abnormality.  Stable-to-improved chronic spine/spinal cord changes, fusion good. MRI stable  10/2020->cont gabapentin hs   Statin intolerance    Umbilical hernia [id number]   Past Surgical History:  Procedure Laterality Date   ANTERIOR CERVICAL DECOMP/DISCECTOMY FUSION N/A 03/07/2017   Procedure: Cervical four-five, Cervical five-six, Cerivcal six- seven Anterior cervical decompression/discectomy/fusion WITH CERVICAL FIVE COREPECTOMY;  Surgeon: SErline Levine MD;  [facility]  Service: Neurosurgery;  Laterality: N/A;  C4-5 C5-6 C6-7 Anterior cervical decompression/discectomy/fusion   BACK SURGERY  12/05/2016   [facility]   COLONOSCOPY W/ POLYPECTOMY  10/13/14   Hyperplastic; recall 10 yrs   Sleep study  09/16/2019   Home sleep study--severe OSA->CPAP recommended   VASECTOMY     WISDOM TOOTH EXTRACTION      Family History  Problem Relation Age of Onset   Arthritis Mother    Arthritis Father    Hyperlipidemia Father    Hypertension Father    Colon polyps Neg Hx    Esophageal cancer Neg Hx    Rectal cancer Neg Hx    Stomach cancer Neg Hx     Social History   Socioeconomic History   Marital status: Divorced    Spouse name: Not on file   Number of children: Not on file   Years of education: Not on file  Highest education level: Not on file  Occupational History   Not on file  Tobacco Use   Smoking status: Former    Packs/day: 1.50    Years: 12.00    Total pack years: 18.00    Types: Cigars, Cigarettes    Quit date: 05/31/1990    Years since quitting: 32.2   Smokeless tobacco: Never   Tobacco comments:    Quit smoking Cigars 10/2015  Vaping Use   Vaping Use: Never used  Substance and Sexual Activity   Alcohol use: Yes    Alcohol/week: 1.0 - 2.0 standard drink of alcohol    Types: 1 - 2 Glasses of wine per week    Comment: 2-3 x weekly   Drug use: No   Sexual activity: Not on file  Other Topics Concern   Not on file  Social History Narrative   Divorced.  Two children, both young adults (one son plans to go to med  school).   Occupation: Grading contractor-also a Psychologist, sport and exercise.   Education: 2 years of college.   Orig from [place], still lives there.   Tob 30 pack-yr hx, quit around age 61.   Social drinker.     No regular exercise.  Eating habits poor.   Social Determinants of Health   Financial Resource Strain: Not on file  Food Insecurity: Not on file  Transportation Needs: Not on file  Physical Activity: Not on file  Stress: Not on file  Social Connections: Not on file  Intimate Partner Violence: Not on file    Outpatient Medications Prior to Visit  Medication Sig Dispense Refill   allopurinol (ZYLOPRIM) 300 MG tablet Take 1 tablet (300 mg total) by mouth daily. 90 tablet 3   aspirin EC 81 MG tablet Take 81 mg by mouth daily.     dapagliflozin propanediol (FARXIGA) 10 MG TABS tablet Take 1 tablet (10 mg total) by mouth daily before breakfast. 90 tablet 3   gabapentin (NEURONTIN) 600 MG tablet Take 1 tablet (600 mg total) by mouth 2 (two) times daily. 180 tablet 1   irbesartan (AVAPRO) 75 MG tablet Take 1 tablet (75 mg total) by mouth daily. (Patient taking differently: Take 37.5 mg by mouth daily.) 90 tablet 1   Multiple Vitamins-Minerals (MENS MULTIVITAMIN) TABS Take 1 tablet by mouth daily.     pioglitazone (ACTOS) 45 MG tablet Take 1 tablet (45 mg total) by mouth daily. 90 tablet 3   Plant Sterols and Stanols (CHOLESTOFF PO) Take by mouth daily.     Polyethyl Glycol-Propyl Glycol 0.4-0.3 % SOLN Place 1-2 drops into both eyes 4 (four) times daily as needed (for dry eyes.).     No facility-administered medications prior to visit.    Allergies  Allergen Reactions   Metformin And Related Diarrhea    Nausea and diarrhea   Prednisone Other (See Comments)    Headaches, muscle aches, fatigue   Lisinopril Cough    ROS Review of Systems  Constitutional:  Negative for appetite change, chills, fatigue and fever.  HENT:  Negative for congestion, dental problem, ear pain and sore throat.   Eyes:   Negative for discharge, redness and visual disturbance.  Respiratory:  Negative for cough, chest tightness, shortness of breath and wheezing.   Cardiovascular:  Negative for chest pain, palpitations and leg swelling.  Gastrointestinal:  Negative for abdominal pain, blood in stool, diarrhea, nausea and vomiting.  Genitourinary:  Negative for difficulty urinating, dysuria, flank pain, frequency, hematuria and urgency.  Musculoskeletal:  Negative  for arthralgias, back pain, joint swelling, myalgias and neck stiffness.  Skin:  Negative for pallor and rash.  Neurological:  Negative for dizziness, speech difficulty, weakness and headaches.  Hematological:  Negative for adenopathy. Does not bruise/bleed easily.  Psychiatric/Behavioral:  Negative for confusion and sleep disturbance. The patient is not nervous/anxious.     PE;    08/06/2022    8:03 AM 05/09/2022    7:53 AM 02/08/2022    8:33 AM  Vitals with BMI  Height 5' 5.5" '5\' 5"'$  '5\' 5"'$   Weight 203 lbs 3 oz 199 lbs 13 oz 199 lbs 3 oz  BMI 33.29 32.95 18.84  Systolic 166 063 016  Diastolic 77 70 75  Pulse 60 56 59   Gen: Alert, well appearing.  Patient is oriented to person, place, time, and situation. AFFECT: pleasant, lucid thought and speech. ENT: Ears: EACs clear, normal epithelium.  TMs with good light reflex and landmarks bilaterally.  Eyes: no injection, icteris, swelling, or exudate.  EOMI, PERRLA. Nose: no drainage or turbinate edema/swelling.  No injection or focal lesion.  Mouth: lips without lesion/swelling.  Oral mucosa pink and moist.  Dentition intact and without obvious caries or gingival swelling.  Oropharynx without erythema, exudate, or swelling.  Neck: supple/nontender.  No LAD, mass, or TM.  Carotid pulses 2+ bilaterally, without bruits. CV: RRR, no m/r/g.   LUNGS: CTA bilat, nonlabored resps, good aeration in all lung fields. ABD: soft, NT, ND, BS normal.  No hepatospenomegaly or mass.  No bruits. EXT: no clubbing,  cyanosis, or edema.  Musculoskeletal: no joint swelling, erythema, warmth, or tenderness.  ROM of all joints intact. Skin - no sores or suspicious lesions or rashes or color changes Foot exam - no swelling, tenderness or skin or vascular lesions. Color and temperature is normal. Sensation is intact. Peripheral pulses are palpable. Toenails are normal.  Pertinent labs:  Lab Results  Component Value Date   TSH 1.85 06/05/2021   Lab Results  Component Value Date   WBC 6.1 06/05/2021   HGB 15.5 06/05/2021   HCT 46.9 06/05/2021   MCV 91.8 06/05/2021   PLT 178 06/05/2021   Lab Results  Component Value Date   CREATININE 1.02 10/23/2021   BUN 17 10/23/2021   NA 140 10/23/2021   K 4.4 10/23/2021   CL 107 10/23/2021   CO2 24 10/23/2021   Lab Results  Component Value Date   ALT 11 06/05/2021   AST 13 06/05/2021   ALKPHOS 77 03/18/2019   BILITOT 0.5 06/05/2021   Lab Results  Component Value Date   CHOL 216 (H) 06/05/2021   Lab Results  Component Value Date   HDL 61 06/05/2021   Lab Results  Component Value Date   LDLCALC 130 (H) 06/05/2021   Lab Results  Component Value Date   TRIG 140 06/05/2021   Lab Results  Component Value Date   CHOLHDL 3.5 06/05/2021   Lab Results  Component Value Date   PSA 1.16 06/05/2021   PSA 1.35 03/18/2019   PSA 1.14 11/26/2017   Lab Results  Component Value Date   HGBA1C 6.2 (A) 02/08/2022   HGBA1C 6.2 02/08/2022   HGBA1C 6.2 02/08/2022   HGBA1C 6.2 02/08/2022   ASSESSMENT AND PLAN:   #1 health maintenance exam: Reviewed age and gender appropriate health maintenance issues (prudent diet, regular exercise, health risks of tobacco and excessive alcohol, use of seatbelts, fire alarms in home, use of sunscreen).  Also reviewed age and gender appropriate  health screening as well as vaccine recommendations. Vaccines: Prevnar 20->declined.  Shingrix-->declined.  Flu-->declined. Labs: fasting HP, Hba1c, and PSA ordered. Prostate ca  screening: PSA today. Colon ca screening: recall 2026.  #2 diabetes, well-controlled. Continue Actos 45 mg a day and Farxiga 10 mg a day.  POC Hba1c today is 6.7% Feet exam normal. Urine microalb/cr today.  #3 hypertension, well-controlled on one half of 75 mg tab daily. Electrolytes and creatinine today.  An After Visit Summary was printed and given to the patient.  FOLLOW UP:  No follow-ups on file.  Signed:  Crissie Sickles, MD           08/06/2022

## 2022-08-06 NOTE — Patient Instructions (Signed)
Health Maintenance, Male Adopting a healthy lifestyle and getting preventive care are important in promoting health and wellness. Ask your health care provider about: The right schedule for you to have regular tests and exams. Things you can do on your own to prevent diseases and keep yourself healthy. What should I know about diet, weight, and exercise? Eat a healthy diet  Eat a diet that includes plenty of vegetables, fruits, low-fat dairy products, and lean protein. Do not eat a lot of foods that are high in solid fats, added sugars, or sodium. Maintain a healthy weight Body mass index (BMI) is a measurement that can be used to identify possible weight problems. It estimates body fat based on height and weight. Your health care provider can help determine your BMI and help you achieve or maintain a healthy weight. Get regular exercise Get regular exercise. This is one of the most important things you can do for your health. Most adults should: Exercise for at least 150 minutes each week. The exercise should increase your heart rate and make you sweat (moderate-intensity exercise). Do strengthening exercises at least twice a week. This is in addition to the moderate-intensity exercise. Spend less time sitting. Even light physical activity can be beneficial. Watch cholesterol and blood lipids Have your blood tested for lipids and cholesterol at 61 years of age, then have this test every 5 years. You may need to have your cholesterol levels checked more often if: Your lipid or cholesterol levels are high. You are older than 61 years of age. You are at high risk for heart disease. What should I know about cancer screening? Many types of cancers can be detected early and may often be prevented. Depending on your health history and family history, you may need to have cancer screening at various ages. This may include screening for: Colorectal cancer. Prostate cancer. Skin cancer. Lung  cancer. What should I know about heart disease, diabetes, and high blood pressure? Blood pressure and heart disease High blood pressure causes heart disease and increases the risk of stroke. This is more likely to develop in people who have high blood pressure readings or are overweight. Talk with your health care provider about your target blood pressure readings. Have your blood pressure checked: Every 3-5 years if you are 18-39 years of age. Every year if you are 40 years old or older. If you are between the ages of 65 and 75 and are a current or former smoker, ask your health care provider if you should have a one-time screening for abdominal aortic aneurysm (AAA). Diabetes Have regular diabetes screenings. This checks your fasting blood sugar level. Have the screening done: Once every three years after age 45 if you are at a normal weight and have a low risk for diabetes. More often and at a younger age if you are overweight or have a high risk for diabetes. What should I know about preventing infection? Hepatitis B If you have a higher risk for hepatitis B, you should be screened for this virus. Talk with your health care provider to find out if you are at risk for hepatitis B infection. Hepatitis C Blood testing is recommended for: Everyone born from 1945 through 1965. Anyone with known risk factors for hepatitis C. Sexually transmitted infections (STIs) You should be screened each year for STIs, including gonorrhea and chlamydia, if: You are sexually active and are younger than 61 years of age. You are older than 61 years of age and your   health care provider tells you that you are at risk for this type of infection. Your sexual activity has changed since you were last screened, and you are at increased risk for chlamydia or gonorrhea. Ask your health care provider if you are at risk. Ask your health care provider about whether you are at high risk for HIV. Your health care provider  may recommend a prescription medicine to help prevent HIV infection. If you choose to take medicine to prevent HIV, you should first get tested for HIV. You should then be tested every 3 months for as long as you are taking the medicine. Follow these instructions at home: Alcohol use Do not drink alcohol if your health care provider tells you not to drink. If you drink alcohol: Limit how much you have to 0-2 drinks a day. Know how much alcohol is in your drink. In the [place] one drink equals one 12 oz bottle of beer (355 mL), one 5 oz glass of wine (148 mL), or one 1 oz glass of hard liquor (44 mL). Lifestyle Do not use any products that contain nicotine or tobacco. These products include cigarettes, chewing tobacco, and vaping devices, such as e-cigarettes. If you need help quitting, ask your health care provider. Do not use street drugs. Do not share needles. Ask your health care provider for help if you need support or information about quitting drugs. General instructions Schedule regular health, dental, and eye exams. Stay current with your vaccines. Tell your health care provider if: You often feel depressed. You have ever been abused or do not feel safe at home. Summary Adopting a healthy lifestyle and getting preventive care are important in promoting health and wellness. Follow your health care provider's instructions about healthy diet, exercising, and getting tested or screened for diseases. Follow your health care provider's instructions on monitoring your cholesterol and blood pressure. This information is not intended to replace advice given to you by your health care provider. Make sure you discuss any questions you have with your health care provider. Document Revised: 01/01/2021 Document Reviewed: 01/01/2021 Elsevier Patient Education  2023 [employer]  

## 2022-08-07 LAB — LIPID PANEL
Cholesterol: 194 mg/dL (ref <200)
HDL: 64 mg/dL (ref >=40)
LDL Cholesterol (Calc): 109 mg/dL (calc) — ABNORMAL HIGH
Non-HDL Cholesterol (Calc): 130 mg/dL (calc) — ABNORMAL HIGH (ref <130)
Total CHOL/HDL Ratio: 3 (calc) (ref <5.0)
Triglycerides: 105 mg/dL (ref <150)

## 2022-08-07 LAB — COMPREHENSIVE METABOLIC PANEL
AG Ratio: 1.8 (calc) (ref 1.0–2.5)
ALT: 21 U/L (ref 9–46)
AST: 14 U/L (ref 10–35)
Albumin: 4.1 g/dL (ref 3.6–5.1)
Alkaline phosphatase (APISO): 81 U/L (ref 35–144)
BUN: 16 mg/dL (ref 7–25)
CO2: 26 mmol/L (ref 20–32)
Calcium: 9.3 mg/dL (ref 8.6–10.3)
Chloride: 107 mmol/L (ref 98–110)
Creat: 0.99 mg/dL (ref 0.70–1.35)
Globulin: 2.3 g/dL (calc) (ref 1.9–3.7)
Glucose, Bld: 147 mg/dL — ABNORMAL HIGH (ref 65–99)
Potassium: 5.2 mmol/L (ref 3.5–5.3)
Sodium: 142 mmol/L (ref 135–146)
Total Bilirubin: 0.6 mg/dL (ref 0.2–1.2)
Total Protein: 6.4 g/dL (ref 6.1–8.1)

## 2022-08-07 LAB — CBC
HCT: 49.8 % (ref 38.5–50.0)
Hemoglobin: 17 g/dL (ref 13.2–17.1)
MCH: 29.8 pg (ref 27.0–33.0)
MCHC: 34.1 g/dL (ref 32.0–36.0)
MCV: 87.4 fL (ref 80.0–100.0)
MPV: 10.4 fL (ref 7.5–12.5)
Platelets: 202 10*3/uL (ref 140–400)
RBC: 5.7 Million/uL (ref 4.20–5.80)
RDW: 12.9 % (ref 11.0–15.0)
WBC: 6.2 10*3/uL (ref 3.8–10.8)

## 2022-08-07 LAB — MICROALBUMIN / CREATININE URINE RATIO
Creatinine, Urine: 88 mg/dL (ref 20–320)
Microalb Creat Ratio: 10 ug/mg{creat}
Microalb, Ur: 0.9 mg/dL

## 2022-08-07 LAB — PSA: PSA: 1.33 ng/mL (ref <=4.00)

## 2022-08-07 LAB — TSH: TSH: 2.45 mIU/L (ref 0.40–4.50)

## 2022-08-08 ENCOUNTER — Telehealth: Payer: Self-pay | Admitting: Family Medicine

## 2022-08-08 NOTE — Telephone Encounter (Signed)
Noted  

## 2022-08-08 NOTE — Telephone Encounter (Signed)
Informed patient of  lab results that were excellent and no new recommendations.  Patient understood information and no additional questions or concerns.

## 2022-09-25 ENCOUNTER — Telehealth: Payer: Self-pay | Admitting: Family Medicine

## 2022-09-25 NOTE — Telephone Encounter (Signed)
Please review and advise.

## 2022-09-25 NOTE — Telephone Encounter (Signed)
Pt advised of recommendations

## 2022-09-25 NOTE — Telephone Encounter (Signed)
Pt confirmed nausea but could not confirm diarrhea.  Please advise of med suggestion.

## 2022-09-25 NOTE — Telephone Encounter (Signed)
I recommend holding off on a new medication right now.  Continue Actos 45 mg a day.  If next A1c is significantly rising then we will add something.

## 2022-09-25 NOTE — Telephone Encounter (Signed)
I thought he had intolerable nausea and diarrhea on metformin. Pls confirm with pt. Okay to stop the [place] but want to make sure that we get him on something else that he will tolerate.

## 2022-09-25 NOTE — Telephone Encounter (Signed)
Pt would like to switch from [place] back to metformin. He reports that the [place] is too expensive and that he can't afford it. He states that he believes his insurance will cover the Metformin again. Please let the patient know if this switch can be approved.

## 2022-10-22 ENCOUNTER — Encounter: Payer: Self-pay | Admitting: Family Medicine

## 2022-10-22 ENCOUNTER — Ambulatory Visit: Payer: 59 | Admitting: Family Medicine

## 2022-10-22 VITALS — BP 147/76 | HR 70 | Temp 98.7°F | Ht 65.5 in | Wt 204.6 lb

## 2022-10-22 DIAGNOSIS — I10 Essential (primary) hypertension: Secondary | ICD-10-CM | POA: Diagnosis not present

## 2022-10-22 DIAGNOSIS — E119 Type 2 diabetes mellitus without complications: Secondary | ICD-10-CM | POA: Diagnosis not present

## 2022-10-22 LAB — BASIC METABOLIC PANEL
BUN: 19 mg/dL (ref 6–23)
CO2: 30 mEq/L (ref 19–32)
Calcium: 9.8 mg/dL (ref 8.4–10.5)
Chloride: 107 mEq/L (ref 96–112)
Creatinine, Ser: 1.07 mg/dL (ref 0.40–1.50)
GFR: 74.58 mL/min (ref >60.00)
Glucose, Bld: 133 mg/dL — ABNORMAL HIGH (ref 70–99)
Potassium: 5.1 mEq/L (ref 3.5–5.1)
Sodium: 141 mEq/L (ref 135–145)

## 2022-10-22 LAB — POCT GLYCOSYLATED HEMOGLOBIN (HGB A1C)
HbA1c POC (<> result, manual entry): 6.1 % (ref 4.0–5.6)
HbA1c, POC (controlled diabetic range): 6.1 % (ref 0.0–7.0)
HbA1c, POC (prediabetic range): 6.1 % (ref 5.7–6.4)
Hemoglobin A1C: 6.1 % — AB (ref 4.0–5.6)

## 2022-10-22 MED ORDER — HYDROCHLOROTHIAZIDE 25 MG PO TABS: 25.0000 mg | ORAL_TABLET | Freq: Every day | ORAL | 0 refills | Status: DC

## 2022-10-22 MED ORDER — GABAPENTIN 600 MG PO TABS: 600.0000 mg | ORAL_TABLET | Freq: Two times a day (BID) | ORAL | 1 refills | Status: DC

## 2022-10-22 MED ORDER — PIOGLITAZONE HCL 45 MG PO TABS: 45.0000 mg | ORAL_TABLET | Freq: Every day | ORAL | 1 refills | Status: DC

## 2022-10-22 MED ORDER — IRBESARTAN 75 MG PO TABS: 75.0000 mg | ORAL_TABLET | Freq: Every day | ORAL | 1 refills | Status: DC

## 2022-10-22 MED ORDER — ALLOPURINOL 300 MG PO TABS: 300.0000 mg | ORAL_TABLET | Freq: Every day | ORAL | 1 refills | Status: DC

## 2022-10-22 NOTE — Progress Notes (Signed)
OFFICE VISIT  10/22/2022  CC:  Chief Complaint  Patient presents with   Medical Management of Chronic Issues    Patient is a 62 y.o. male who presents for 2-1/37-monthfollow-up diabetes and hypertension. A/P as of last visit: "#1 health maintenance exam: Reviewed age and gender appropriate health maintenance issues (prudent diet, regular exercise, health risks of tobacco and excessive alcohol, use of seatbelts, fire alarms in home, use of sunscreen).  Also reviewed age and gender appropriate health screening as well as vaccine recommendations. Vaccines: Prevnar 20->declined.  Shingrix-->declined.  Flu-->declined. Labs: fasting HP, Hba1c, and PSA ordered. Prostate ca screening: PSA today. Colon ca screening: recall 2026.   #2 diabetes, well-controlled. Continue Actos 45 mg a day and Farxiga 10 mg a day.  POC Hba1c today is 6.7% Feet exam normal. Urine microalb/cr today.   #3 hypertension, well-controlled on one half of 75 mg tab daily. Electrolytes and creatinine today."  INTERIM HX: PCozyfeels well.  Had to d/c farxiga d/t cost. Taking Actos 45 mg a day. Has been doing much better with diet and exercise.  BP at dentist 3 wks ago 160s syst.  Up to 200 syst after tooth pulled. PKilynis not monitoring it at home lately.  ROS as above, plus--> no fevers, no CP, no SOB, no wheezing, no cough, no dizziness, no HAs, no rashes, no melena/hematochezia.  No polyuria or polydipsia.  No myalgias or arthralgias.  No focal weakness, paresthesias, or tremors.  No acute vision or hearing abnormalities.  No dysuria or unusual/new urinary urgency or frequency.  No recent changes in lower legs. No n/v/d or abd pain.  No palpitations.    Past Medical History:  Diagnosis Date   Asthma    "grew out of it", esp after quitting smoking arond age [age]yrs old   Atypical chest pain 06/12/2014   Carpal tunnel syndrome    bilat, L>>R (Dr. SVertell Limber.  Hand symptoms prompted referral to rheum--lab w/u NEG.   Wrist splinting x 6 wks advised, not much help so low dose prednisone rx'd, then pt had to get steroid injection into L carpal tunnel 12/2016.   Cervical myel[facility] ([facility]    Surgical fusion--Dr SVertell Limber(cerv spinal stenosis).  Stable as of 08/2019 and 10/2020 neurosurg f/u.   Chronic low back pain    COVID-19 virus infection 05/2020   Diabetes mellitus without complication [place]    No diab retinop as of 12/2014   Gout    Knee and toe--allopurinol since approx 2012 and has had no flares since being on this med (1/2 of 300 mg tab qd)   History of hyperkalemia 2015/2016   low K diet; cut lisinopril from '10mg'$  to '5mg'$  qd 11/2014.   Hyperlipidemia    med x 1 trial=adverse side effects so he stopped it.  LDL 130s 08/2014, then dx'd with DM 2 shortly after, pt refused another trial of statin 05/2017 and 2020.   Hypertension    MDD (major depressive disorder), recurrent episode ([facility] [place]    initiated tx 09/2020   OSA (obstructive sleep apnea)    Remote past: had cpap but didn't use much .  REPEAT HOME SLEEP STUDY 09/16/19-> severe OSA, pt got on CPAP after this.   Paresthesia of both hands    Dr. SVertell Limber MRI 01/2018: no acute abnormality.  Stable-to-improved chronic spine/spinal cord changes, fusion good. MRI stable 10/2020->cont gabapentin hs   Statin intolerance    Umbilical hernia [id number]   Past Surgical History:  Procedure Laterality  Date   ANTERIOR CERVICAL DECOMP/DISCECTOMY FUSION N/A 03/07/2017   Procedure: Cervical four-five, Cervical five-six, Cerivcal six- seven Anterior cervical decompression/discectomy/fusion WITH CERVICAL FIVE COREPECTOMY;  Surgeon: Erline Levine, MD;  Location: [place];  Service: Neurosurgery;  Laterality: N/A;  C4-5 C5-6 C6-7 Anterior cervical decompression/discectomy/fusion   BACK SURGERY  12/05/2016   Lumbar, Dr. Vertell Limber [facility] center   COLONOSCOPY W/ POLYPECTOMY  10/13/14   Hyperplastic; recall 10 yrs   Sleep study  09/16/2019   Home sleep study--severe OSA->CPAP  recommended   VASECTOMY     WISDOM TOOTH EXTRACTION      Outpatient Medications Prior to Visit  Medication Sig Dispense Refill   aspirin EC 81 MG tablet Take 81 mg by mouth daily.     Multiple Vitamins-Minerals (MENS MULTIVITAMIN) TABS Take 1 tablet by mouth daily.     Plant Sterols and Stanols (CHOLESTOFF PO) Take by mouth daily.     Polyethyl Glycol-Propyl Glycol 0.4-0.3 % SOLN Place 1-2 drops into both eyes 4 (four) times daily as needed (for dry eyes.).     allopurinol (ZYLOPRIM) 300 MG tablet Take 1 tablet (300 mg total) by mouth daily. 90 tablet 3   gabapentin (NEURONTIN) 600 MG tablet Take 1 tablet (600 mg total) by mouth 2 (two) times daily. 180 tablet 1   irbesartan (AVAPRO) 75 MG tablet Take 1 tablet (75 mg total) by mouth daily. (Patient taking differently: Take 37.5 mg by mouth daily.) 90 tablet 1   pioglitazone (ACTOS) 45 MG tablet Take 1 tablet (45 mg total) by mouth daily. 90 tablet 3   No facility-administered medications prior to visit.    Allergies  Allergen Reactions   Metformin And Related Diarrhea    Nausea and diarrhea   Prednisone Other (See Comments)    Headaches, muscle aches, fatigue   Lisinopril Cough    Review of Systems As per HPI  PE:    10/22/2022    8:46 AM 10/22/2022    8:38 AM 08/06/2022    8:03 AM  Vitals with BMI  Height  5' 5.5" 5' 5.5"  Weight  204 lbs 10 oz 203 lbs 3 oz  BMI  [id number]  Systolic [id number]  Diastolic 76 78 77  Pulse  70 60     Physical Exam  Gen: Alert, well appearing.  Patient is oriented to person, place, time, and situation. AFFECT: pleasant, lucid thought and speech. CV: RRR, no m/r/g.   LUNGS: CTA bilat, nonlabored resps, good aeration in all lung fields. EXT: no clubbing or cyanosis.  no edema.    LABS:  Last CBC Lab Results  Component Value Date   WBC 6.2 08/06/2022   HGB 17.0 08/06/2022   HCT 49.8 08/06/2022   MCV 87.4 08/06/2022   MCH 29.8 08/06/2022   RDW 12.9 08/06/2022   PLT 202  [id number]   Last metabolic panel Lab Results  Component Value Date   GLUCOSE 147 (H) 08/06/2022   NA 142 08/06/2022   K 5.2 08/06/2022   CL 107 08/06/2022   CO2 26 08/06/2022   BUN 16 08/06/2022   CREATININE 0.99 08/06/2022   GFRNONAA >60 03/05/2017   CALCIUM 9.3 08/06/2022   PROT 6.4 08/06/2022   ALBUMIN 4.7 03/18/2019   BILITOT 0.6 08/06/2022   ALKPHOS 77 03/18/2019   AST 14 08/06/2022   ALT 21 08/06/2022   ANIONGAP 8 03/05/2017     Lab Results  Component Value Date   CHOL 194 08/06/2022  HDL 64 08/06/2022   LDLCALC 109 (H) 08/06/2022   TRIG 105 08/06/2022   CHOLHDL 3.0 08/06/2022   Lab Results  Component Value Date   HGBA1C 6.1 (A) 10/22/2022   HGBA1C 6.1 10/22/2022   HGBA1C 6.1 10/22/2022   HGBA1C 6.1 10/22/2022   Lab Results  Component Value Date   TSH 2.45 08/06/2022   IMPRESSION AND PLAN:  #1 diabetes with out complication. Great control, excellent diet and exercise habits. POC Hba1c today is 6.1%. Continue Actos 45 mg a day.  2.  Uncontrolled hypertension. Add HCTZ 25 mg a day. He is on 37.5 mg irbesartan daily and we can push the dose any higher due to his borderline hyperkalemia. Basic metabolic panel today.  An After Visit Summary was printed and given to the patient.  FOLLOW UP: Return in about 2 weeks (around 11/05/2022) for f/u HTN. Next cpe 07/2023  Signed:  Crissie Sickles, MD           10/22/2022

## 2022-10-29 ENCOUNTER — Ambulatory Visit: Payer: 59 | Admitting: Family Medicine

## 2022-11-05 ENCOUNTER — Ambulatory Visit: Payer: 59 | Admitting: Family Medicine

## 2022-11-05 ENCOUNTER — Encounter: Payer: Self-pay | Admitting: Family Medicine

## 2022-11-05 VITALS — BP 121/71 | HR 60 | Temp 97.3°F | Ht 65.5 in | Wt 200.4 lb

## 2022-11-05 DIAGNOSIS — I10 Essential (primary) hypertension: Secondary | ICD-10-CM | POA: Diagnosis not present

## 2022-11-05 MED ORDER — HYDROCHLOROTHIAZIDE 25 MG PO TABS: 25.0000 mg | ORAL_TABLET | Freq: Every day | ORAL | 1 refills | Status: DC

## 2022-11-05 NOTE — Progress Notes (Signed)
OFFICE VISIT  11/05/2022  CC:  Chief Complaint  Patient presents with   Medical Management of Chronic Issues    Hypertension follow up    Patient is a 62 y.o. male who presents for 2-week follow-up hypertension. A/P as of last visit: "#1 diabetes with out complication. Great control, excellent diet and exercise habits. POC Hba1c today is 6.1%. Continue Actos 45 mg a day.   2.  Uncontrolled hypertension. Add HCTZ 25 mg a day. He is on 37.5 mg irbesartan daily and we can push the dose any higher due to his borderline hyperkalemia. Basic metabolic panel today."  INTERIM HX: Doing great. Is hiking/walking almost daily now, decreased sodium intake, eating better.  Home blood pressures: Average 130/75 Heart rate average low 50s.  Past Medical History:  Diagnosis Date   Asthma    "grew out of it", esp after quitting smoking arond age 24 yrs old   Atypical chest pain 06/12/2014   Carpal tunnel syndrome    bilat, L>>R (Dr. Vertell Limber).  Hand symptoms prompted referral to rheum--lab w/u NEG.  Wrist splinting x 6 wks advised, not much help so low dose prednisone rx'd, then pt had to get steroid injection into L carpal tunnel 12/2016.   Cervical myelopathy [facility])    Surgical fusion--Dr Vertell Limber (cerv spinal stenosis).  Stable as of 08/2019 and 10/2020 neurosurg f/u.   Chronic low back pain    COVID-19 virus infection 05/2020   Diabetes mellitus without complication ([place])    No diab retinop as of 12/2014   Gout    Knee and toe--allopurinol since approx 2012 and has had no flares since being on this med (1/2 of 300 mg tab qd)   History of hyperkalemia 2015/2016   low K diet; cut lisinopril from '10mg'$  to '5mg'$  qd 11/2014.   Hyperlipidemia    med x 1 trial=adverse side effects so he stopped it.  LDL 130s 08/2014, then dx'd with DM 2 shortly after, pt refused another trial of statin 05/2017 and 2020.   Hypertension    MDD (major depressive disorder), recurrent episode ([place])    initiated tx 09/2020    OSA (obstructive sleep apnea)    Remote past: had cpap but didn't use much .  REPEAT HOME SLEEP STUDY 09/16/19-> severe OSA, pt got on CPAP after this.   Paresthesia of both hands    Dr. Vertell Limber: MRI 01/2018: no acute abnormality.  Stable-to-improved chronic spine/spinal cord changes, fusion good. MRI stable 10/2020->cont gabapentin hs   Statin intolerance    Umbilical hernia [id number]    Past Surgical History:  Procedure Laterality Date   ANTERIOR CERVICAL DECOMP/DISCECTOMY FUSION N/A 03/07/2017   Procedure: Cervical four-five, Cervical five-six, Cerivcal six- seven Anterior cervical decompression/discectomy/fusion WITH CERVICAL FIVE COREPECTOMY;  Surgeon: Erline Levine, MD;  Location: [place];  Service: Neurosurgery;  Laterality: N/A;  C4-5 C5-6 C6-7 Anterior cervical decompression/discectomy/fusion   BACK SURGERY  12/05/2016   Lumbar, Dr. Vertell Limber at [facility] surgical center   COLONOSCOPY W/ POLYPECTOMY  10/13/14   Hyperplastic; recall 10 yrs   Sleep study  09/16/2019   Home sleep study--severe OSA->CPAP recommended   VASECTOMY     WISDOM TOOTH EXTRACTION      Outpatient Medications Prior to Visit  Medication Sig Dispense Refill   allopurinol (ZYLOPRIM) 300 MG tablet Take 1 tablet (300 mg total) by mouth daily. 90 tablet 1   aspirin EC 81 MG tablet Take 81 mg by mouth daily.     gabapentin (NEURONTIN) 600 MG  tablet Take 1 tablet (600 mg total) by mouth 2 (two) times daily. 180 tablet 1   irbesartan (AVAPRO) 75 MG tablet Take 1 tablet (75 mg total) by mouth daily. 90 tablet 1   Multiple Vitamins-Minerals (MENS MULTIVITAMIN) TABS Take 1 tablet by mouth daily.     pioglitazone (ACTOS) 45 MG tablet Take 1 tablet (45 mg total) by mouth daily. 90 tablet 1   Plant Sterols and Stanols (CHOLESTOFF PO) Take by mouth daily.     Polyethyl Glycol-Propyl Glycol 0.4-0.3 % SOLN Place 1-2 drops into both eyes 4 (four) times daily as needed (for dry eyes.).     hydrochlorothiazide (HYDRODIURIL) 25 MG tablet Take 1  tablet (25 mg total) by mouth daily. 30 tablet 0   No facility-administered medications prior to visit.    Allergies  Allergen Reactions   Metformin And Related Diarrhea    Nausea and diarrhea   Prednisone Other (See Comments)    Headaches, muscle aches, fatigue   Lisinopril Cough    Review of Systems As per HPI  PE:    11/05/2022   10:08 AM 10/22/2022    8:46 AM 10/22/2022    8:38 AM  Vitals with BMI  Height 5' 5.5"  5' 5.5"  Weight 200 lbs 6 oz  204 lbs 10 oz  BMI [id number]  [id number]  Systolic [id number] [id number]  Diastolic 71 76 78  Pulse 60  70     Physical Exam  Gen: Alert, well appearing.  Patient is oriented to person, place, time, and situation. AFFECT: pleasant, lucid thought and speech. CV: Regular, bradycardic, no murmur EXT: no clubbing or cyanosis.  no edema.    LABS:  Last metabolic panel Lab Results  Component Value Date   GLUCOSE 133 (H) 10/22/2022   NA 141 10/22/2022   K 5.1 10/22/2022   CL 107 10/22/2022   CO2 30 10/22/2022   BUN 19 10/22/2022   CREATININE 1.07 10/22/2022   GFRNONAA >60 03/05/2017   CALCIUM 9.8 10/22/2022   PROT 6.4 08/06/2022   ALBUMIN 4.7 03/18/2019   BILITOT 0.6 08/06/2022   ALKPHOS 77 03/18/2019   AST 14 08/06/2022   ALT 21 08/06/2022   ANIONGAP 8 03/05/2017   Lab Results  Component Value Date   HGBA1C 6.1 (A) 10/22/2022   HGBA1C 6.1 10/22/2022   HGBA1C 6.1 10/22/2022   HGBA1C 6.1 10/22/2022    IMPRESSION AND PLAN:  Hypertension, well-controlled now on HCTZ 25 mg a day and irbesartan one half of 75 mg tab a day. Monitor electrolytes and creatinine today.  An After Visit Summary was printed and given to the patient.  FOLLOW UP: Return in about 6 months (around 05/08/2023) for routine chronic illness f/u. Next cpe 07/2023  Signed:  Crissie Sickles, MD           11/05/2022

## 2022-11-06 LAB — BASIC METABOLIC PANEL
BUN: 23 mg/dL (ref 7–25)
CO2: 27 mmol/L (ref 20–32)
Calcium: 9.4 mg/dL (ref 8.6–10.3)
Chloride: 100 mmol/L (ref 98–110)
Creat: 1.06 mg/dL (ref 0.70–1.35)
Glucose, Bld: 132 mg/dL — ABNORMAL HIGH (ref 65–99)
Potassium: 5 mmol/L (ref 3.5–5.3)
Sodium: 138 mmol/L (ref 135–146)

## 2023-04-04 ENCOUNTER — Other Ambulatory Visit: Payer: Self-pay | Admitting: Family Medicine

## 2023-04-10 ENCOUNTER — Encounter (INDEPENDENT_AMBULATORY_CARE_PROVIDER_SITE_OTHER): Payer: Self-pay

## 2023-05-04 ENCOUNTER — Other Ambulatory Visit: Payer: Self-pay | Admitting: Family Medicine

## 2023-05-07 NOTE — Progress Notes (Deleted)
PATIENT: Derrick Willis DOB: 1961/07/14  REASON FOR VISIT: follow up HISTORY FROM: patient  No chief complaint on file.     HISTORY OF PRESENT ILLNESS:  05/07/23 ALL: Derrick Willis returns for follow up for OSA on CPAP. He continues to do well. He is using CPAP nightly for about   05/09/2022 ALL: Fillmore returns for follow up for OSA on CPAP. He continues to do well. He is using CPAP nightly for about 6.5 hr. He admits that he may not use if out of town over night. He does take CPAP on longer trips. He changed to a nasal mask and feels it fits better. Leak has improved. He does note improvement in sleep quality and daytime energy when using CPAP.     05/07/2021 ALL: Grabiel returns for follow up for OSA on CPAP. He reports doing well. He is using CPAP most every night. He does note significant benefit when using CPAP. He does report needing to change his mask. He can tell that air is leaking when he wakes in the mornings. Otherwise, he is feeling well and without complaints.     05/04/2020 ALL:  MICHELANGELO Willis is a 62 y.o. male here today for follow up for OSA on CPAP.   Compliance report dated 04/03/2020 through 05/02/2020 reveals that he used CPAP 27 of the past 30 days for compliance of 90%.  He is CPAP greater than 4 hours 27 of the past 30 days for compliance of 90%.  Average usage on days used was 6 hours and 38 minutes.  Residual AHI was 0.6 on 6 to 18 cm of water and an EPR of 2.  There was no significant leak noted.  HISTORY: (copied from my note on 11/02/2019)  Derrick Willis is a 62 y.o. male here today for follow up OSA recently started on CPAP therapy. HST revealed total AHI of 31.5/hr and REM sleep AHI of 52.3/hr. No prolonged hypoxemia noted.  Walker reports that he is doing very well on CPAP therapy.  He notes significant improvement in sleep quality and feels less fatigued throughout the day.  He is now sleeping through the night without waking to use the restroom.  He denies any  difficulty with his machine.   Compliance report dated 10/02/2019 through 10/31/2019 reveals that he is used CPAP 30 of the last 30 days for compliance of 100%.  He used CPAP greater than 4 hours all 30 days for compliance of 100%.  Average usage was 6 hours and 35 minutes.  Residual AHI was 1.2 on 6 to 18 cm of water and an EPR of 2.  There was no significant leak noted.   HISTORY: (copied from  note on 09/06/2019)   Derrick Willis is a 62 year old Caucasian male patient seen upon referral on 09/06/2019 .  Chief concern according to patient :  I have a CPAP from years ago, but it didn't do me any good. I had the study in [place], [place] at [facility].  The machine was mailed to me and I never followed up.    Mr. Trettel had childhood asthma it lasted probably through his 19s as long as he was smoking which she quit around age 75.  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   He has a history of carpal tunnel syndrome surgically treated by Dr. Venetia Maxon 2018 cervical myelopathy and surgical fusion December 2019 by Dr. Venetia Maxon, lower back degenerative disease.  Gout affecting knee and toe, hyperkalemia, hyperlipidemia, hypertension, in June 2019 Dr. Larina Bras evaluated him for paresthesias of both hands.  Is also history of umbilical hernia in 2016.  And diabetes mellitus.   DOT evaluation questioned his lack of CPAP use and asked for re-evaluation. He is also seeing Dr Venetia Maxon next week.     Sleep relevant medical history: Nocturia- one, no Tonsillectomy , had cervical spine surgery, but no  deviated septum or sinus  surgery, no UPPP.   he had tubes in his ears at kindergarten age.  Family medical /sleep history:  other family member on CPAP with OSA, insomnia, sleep walkers.    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 and lives in a household alone with his dog.  He is divorced, his wife reportedly was very bothered by his snoring.  He has 2 adult sons, one in DO school.  The patient currently works. Tobacco use; quit at age 73 .   ETOH use - 2-3 drinks a week, Caffeine intake in form of Coffee( part decaffeinated 4-6 cups a day ) Soda( none ) Tea ( once I a while ) or energy drinks. Regular exercise in form of walking     Sleep habits are as follows: The patient's dinner time is between 6-8  PM. The patient goes to bed at 10 PM and continues to sleep for many hours,. The preferred sleep position is sideways, with the support of 2 pillows. Dreams are reportedly frequent.  6 AM is the usual rise time. The patient wakes up spontaneously at 5-6 AM He reports  feeling refreshed or restored in AM, with symptoms such as dry mouth, but no morning headaches, and only mild residual fatigue.  Naps are taken in frequently    REVIEW OF SYSTEMS: Out of a complete 14 system review of symptoms, the patient complains only of the following symptoms, none and all other reviewed systems are negative.  ESS: 4/24, previously 7/24   ALLERGIES: Allergies  Allergen Reactions   Metformin And Related Diarrhea    Nausea and diarrhea   Prednisone Other (See Comments)    Headaches, muscle aches, fatigue   Lisinopril Cough    HOME MEDICATIONS: Outpatient Medications Prior to Visit  Medication Sig Dispense Refill   allopurinol (ZYLOPRIM) 300 MG tablet Take 1 tablet by mouth once daily 30 tablet 0   aspirin EC 81 MG tablet Take 81 mg by mouth daily.     gabapentin (NEURONTIN) 600 MG tablet Take 1 tablet (600 mg total) by mouth 2 (two) times daily. 180 tablet 1    hydrochlorothiazide (HYDRODIURIL) 25 MG tablet Take 1 tablet by mouth once daily 7 tablet 0   irbesartan (AVAPRO) 75 MG tablet Take 1 tablet by mouth once daily 7 tablet 0   Multiple Vitamins-Minerals (MENS MULTIVITAMIN) TABS Take 1 tablet by mouth daily.     pioglitazone (ACTOS) 45 MG tablet Take 1 tablet by mouth once daily 7 tablet 0   Plant Sterols and Stanols (CHOLESTOFF PO) Take by mouth daily.     Polyethyl Glycol-Propyl Glycol 0.4-0.3 % SOLN Place 1-2 drops into both eyes 4 (  four) times daily as needed (for dry eyes.).     No facility-administered medications prior to visit.    PAST MEDICAL HISTORY: Past Medical History:  Diagnosis Date   Asthma    "grew out of it", esp after quitting smoking arond age 73 yrs old   Atypical chest pain 06/12/2014   Carpal tunnel syndrome    bilat, L>>R (Dr. Venetia Maxon).  Hand symptoms prompted referral to rheum--lab w/u NEG.  Wrist splinting x 6 wks advised, not much help so low dose prednisone rx'd, then pt had to get steroid injection into L carpal tunnel 12/2016.   Cervical myelopathy [facility])    Surgical fusion--Dr Venetia Maxon (cerv spinal stenosis).  Stable as of 08/2019 and 10/2020 neurosurg f/u.   Chronic low back pain    COVID-19 virus infection 05/2020   Diabetes mellitus without complication (HCC)    No diab retinop as of 12/2014   Gout    Knee and toe--allopurinol since approx 2012 and has had no flares since being on this med (1/2 of 300 mg tab qd)   History of hyperkalemia 2015/2016   low K diet; cut lisinopril from 10mg  to 5mg  qd 11/2014.   Hyperlipidemia    med x 1 trial=adverse side effects so he stopped it.  LDL 130s 08/2014, then dx'd with DM 2 shortly after, pt refused another trial of statin 05/2017 and 2020.   Hypertension    MDD (major depressive disorder), recurrent episode (HCC)    initiated tx 09/2020   OSA (obstructive sleep apnea)    Remote past: had cpap but didn't use much .  REPEAT HOME SLEEP STUDY 09/16/19-> severe OSA, pt got on  CPAP after this.   Paresthesia of both hands    Dr. Venetia Maxon: MRI 01/2018: no acute abnormality.  Stable-to-improved chronic spine/spinal cord changes, fusion good. MRI stable 10/2020->cont gabapentin hs   Statin intolerance    Umbilical hernia 2016    PAST SURGICAL HISTORY: Past Surgical History:  Procedure Laterality Date   ANTERIOR CERVICAL DECOMP/DISCECTOMY FUSION N/A 03/07/2017   Procedure: Cervical four-five, Cervical five-six, Cerivcal six- seven Anterior cervical decompression/discectomy/fusion WITH CERVICAL FIVE COREPECTOMY;  Surgeon: Maeola Harman, MD;  Location: [facility] OR;  Service: Neurosurgery;  Laterality: N/A;  C4-5 C5-6 C6-7 Anterior cervical decompression/discectomy/fusion   BACK SURGERY  12/05/2016   Lumbar, Dr. Venetia Maxon at [facility] center   COLONOSCOPY W/ POLYPECTOMY  10/13/14   Hyperplastic; recall 10 yrs   Sleep study  09/16/2019   Home sleep study--severe OSA->CPAP recommended   VASECTOMY     WISDOM TOOTH EXTRACTION      FAMILY HISTORY: Family History  Problem Relation Age of Onset   Arthritis Mother    Arthritis Father    Hyperlipidemia Father    Hypertension Father    Colon polyps Neg Hx    Esophageal cancer Neg Hx    Rectal cancer Neg Hx    Stomach cancer Neg Hx     SOCIAL HISTORY: Social History   Socioeconomic History   Marital status: Divorced    Spouse name: Not on file   Number of children: Not on file   Years of education: Not on file   Highest education level: Not on file  Occupational History   Not on file  Tobacco Use   Smoking status: Former    Current packs/day: 0.00    Average packs/day: 1.5 packs/day for 12.0 years (18.0 ttl pk-yrs)    Types: Cigars, Cigarettes    Start date: 05/31/1978  Quit date: 05/31/1990    Years since quitting: 32.9   Smokeless tobacco: Never   Tobacco comments:    Quit smoking Cigars 10/2015  Vaping Use   Vaping status: Never Used  Substance and Sexual Activity   Alcohol use: Yes    Alcohol/week:  1.0 - 2.0 standard drink of alcohol    Types: 1 - 2 Glasses of wine per week    Comment: 2-3 x weekly   Drug use: No   Sexual activity: Not on file  Other Topics Concern   Not on file  Social History Narrative   Divorced.  Two children, both young adults (one son plans to go to med school).   Occupation: Grading contractor-also a Visual merchandiser.   Education: 2 years of college.   Orig from [place], still lives there.   Tob 30 pack-yr hx, quit around age 57.   Social drinker.     No regular exercise.  Eating habits poor.   Social Determinants of Health   Financial Resource Strain: Not on file  Food Insecurity: Not on file  Transportation Needs: Not on file  Physical Activity: Not on file  Stress: Not on file  Social Connections: Not on file  Intimate Partner Violence: Not on file      PHYSICAL EXAM  There were no vitals filed for this visit.    There is no height or weight on file to calculate BMI.  Generalized: Well developed, in no acute distress  Cardiology: normal rate and rhythm, no murmur noted Respiratory: clear to auscultation bilaterally  Neurological examination  Mentation: Alert oriented to time, place, history taking. Follows all commands speech and language fluent Cranial nerve II-XII: Pupils were equal round reactive to light. Extraocular movements were full, visual field were full  Motor: The motor testing reveals 5 over 5 strength of all 4 extremities. Good symmetric motor tone is noted throughout.  Gait and station: Gait is normal.    DIAGNOSTIC DATA (LABS, IMAGING, TESTING) - I reviewed patient records, labs, notes, testing and imaging myself where available.      No data to display           Lab Results  Component Value Date   WBC 6.2 08/06/2022   HGB 17.0 08/06/2022   HCT 49.8 08/06/2022   MCV 87.4 08/06/2022   PLT 202 08/06/2022      Component Value Date/Time   NA 138 11/05/2022 1040   K 5.0 11/05/2022 1040   CL 100 11/05/2022 1040    CO2 27 11/05/2022 1040   GLUCOSE 132 (H) 11/05/2022 1040   BUN 23 11/05/2022 1040   CREATININE 1.06 11/05/2022 1040   CALCIUM 9.4 11/05/2022 1040   PROT 6.4 08/06/2022 0821   ALBUMIN 4.7 03/18/2019 0826   AST 14 08/06/2022 0821   ALT 21 08/06/2022 0821   ALKPHOS 77 03/18/2019 0826   BILITOT 0.6 08/06/2022 0821   GFRNONAA >60 03/05/2017 0951   GFRAA >60 03/05/2017 0951   Lab Results  Component Value Date   CHOL 194 08/06/2022   HDL 64 08/06/2022   LDLCALC 109 (H) 08/06/2022   TRIG 105 08/06/2022   CHOLHDL 3.0 08/06/2022   Lab Results  Component Value Date   HGBA1C 6.1 (A) 10/22/2022   HGBA1C 6.1 10/22/2022   HGBA1C 6.1 10/22/2022   HGBA1C 6.1 10/22/2022   Lab Results  Component Value Date   VITAMINB12 515 08/08/2015   Lab Results  Component Value Date   TSH 2.45 08/06/2022  ASSESSMENT AND PLAN 62 y.o. year old male  has a past medical history of Asthma, Atypical chest pain (06/12/2014), Carpal tunnel syndrome, Cervical myelopathy (HCC), Chronic low back pain, COVID-19 virus infection (05/2020), Diabetes mellitus without complication (HCC), Gout, History of hyperkalemia (2015/2016), Hyperlipidemia, Hypertension, MDD (major depressive disorder), recurrent episode (HCC), OSA (obstructive sleep apnea), Paresthesia of both hands, Statin intolerance, and Umbilical hernia (2016). here with   No diagnosis found.   Montravious is doing very well on CPAP therapy.  Compliance report reveals optimal compliance.  He was encouraged to continue using CPAP nightly and for greater than 4 hours each night. Supply orders updated. Healthy lifestyle habits encouraged.  He will follow-up with primary care as directed.  He will return to see [place] in 1 year, sooner if needed.  He verbalizes understanding and agreement with this plan.   No orders of the defined types were placed in this encounter.     No orders of the defined types were placed in this encounter.      Shawnie Dapper, FNP-C  05/07/2023, 8:44 AM [facility] [address] [place], [place] [address] [phone]

## 2023-05-08 ENCOUNTER — Ambulatory Visit: Payer: 59 | Admitting: Family Medicine

## 2023-05-09 ENCOUNTER — Encounter: Payer: Self-pay | Admitting: Family Medicine

## 2023-05-09 ENCOUNTER — Ambulatory Visit: Payer: 59 | Admitting: Family Medicine

## 2023-05-09 VITALS — BP 120/72 | HR 65 | Temp 98.3°F | Ht 65.5 in | Wt 206.4 lb

## 2023-05-09 DIAGNOSIS — I10 Essential (primary) hypertension: Secondary | ICD-10-CM

## 2023-05-09 DIAGNOSIS — Z7984 Long term (current) use of oral hypoglycemic drugs: Secondary | ICD-10-CM | POA: Diagnosis not present

## 2023-05-09 DIAGNOSIS — E119 Type 2 diabetes mellitus without complications: Secondary | ICD-10-CM | POA: Diagnosis not present

## 2023-05-09 LAB — POCT GLYCOSYLATED HEMOGLOBIN (HGB A1C)
HbA1c POC (<> result, manual entry): 5.6 % (ref 4.0–5.6)
HbA1c, POC (controlled diabetic range): 5.6 % (ref 0.0–7.0)
HbA1c, POC (prediabetic range): 5.6 % — AB (ref 5.7–6.4)
Hemoglobin A1C: 5.6 % (ref 4.0–5.6)

## 2023-05-09 LAB — BASIC METABOLIC PANEL
BUN: 15 mg/dL (ref 6–23)
CO2: 28 meq/L (ref 19–32)
Calcium: 9.4 mg/dL (ref 8.4–10.5)
Chloride: 107 meq/L (ref 96–112)
Creatinine, Ser: 0.97 mg/dL (ref 0.40–1.50)
GFR: 83.58 mL/min (ref >60.00)
Glucose, Bld: 134 mg/dL — ABNORMAL HIGH (ref 70–99)
Potassium: 4.7 meq/L (ref 3.5–5.1)
Sodium: 142 meq/L (ref 135–145)

## 2023-05-09 MED ORDER — PIOGLITAZONE HCL 45 MG PO TABS: 45.0000 mg | ORAL_TABLET | Freq: Every day | ORAL | 1 refills | Status: DC

## 2023-05-09 MED ORDER — HYDROCHLOROTHIAZIDE 25 MG PO TABS: 25.0000 mg | ORAL_TABLET | Freq: Every day | ORAL | 1 refills | Status: DC

## 2023-05-09 MED ORDER — IRBESARTAN 75 MG PO TABS: 75.0000 mg | ORAL_TABLET | Freq: Every day | ORAL | 1 refills | Status: DC

## 2023-05-09 MED ORDER — GABAPENTIN 600 MG PO TABS: 600.0000 mg | ORAL_TABLET | Freq: Two times a day (BID) | ORAL | 1 refills | Status: DC

## 2023-05-09 NOTE — Progress Notes (Signed)
OFFICE VISIT  05/09/2023  CC:  Chief Complaint  Patient presents with   Medical Management of Chronic Issues    Pt is fasting    Patient is a 62 y.o. male who presents for 71-month follow-up diabetes and hypertension.  HPI: Feeling well. Great diet last 3 mo. 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  Home bp check occasional: 130-140 over low 80.   Past Medical History:  Diagnosis Date   Asthma    "grew out of it", esp after quitting smoking arond age 67 yrs old   Atypical chest pain 06/12/2014   Carpal tunnel syndrome    bilat, L>>R (Dr. Venetia Maxon).  Hand symptoms prompted referral to rheum--lab w/u NEG.  Wrist splinting x 6 wks advised, not much help so low dose prednisone rx'd, then pt had to get steroid injection into L carpal tunnel 12/2016.   Cervical myelopathy [facility])    Surgical fusion--Dr Venetia Maxon (cerv spinal stenosis).  Stable as of 08/2019 and 10/2020 neurosurg f/u.   Chronic low back pain    COVID-19 virus infection 05/2020   Diabetes mellitus without complication (HCC)    No diab retinop as of 12/2014   Gout    Knee and toe--allopurinol since approx 2012 and has had no flares since being on this med (1/2 of 300 mg tab qd)   History of hyperkalemia 2015/2016   low K diet; cut lisinopril from 10mg  to 5mg  qd 11/2014.   Hyperlipidemia    med x 1 trial=adverse side effects so he stopped it.  LDL 130s 08/2014, then dx'd with DM 2 shortly after, pt refused another trial of statin 05/2017 and 2020.   Hypertension    MDD (major depressive disorder), recurrent episode (HCC)    initiated tx 09/2020   OSA (obstructive sleep apnea)    Remote past: had cpap but didn't use much .  REPEAT HOME SLEEP STUDY 09/16/19-> severe OSA, pt got on CPAP after this.   Paresthesia of both hands    Dr. Venetia Maxon: MRI 01/2018: no acute abnormality.  Stable-to-improved chronic spine/spinal cord changes, fusion good. MRI stable 10/2020->cont gabapentin hs   Statin intolerance    Umbilical hernia 2016     Past Surgical History:  Procedure Laterality Date   ANTERIOR CERVICAL DECOMP/DISCECTOMY FUSION N/A 03/07/2017   Procedure: Cervical four-five, Cervical five-six, Cerivcal six- seven Anterior cervical decompression/discectomy/fusion WITH CERVICAL FIVE COREPECTOMY;  Surgeon: Maeola Harman, MD;  Location: [facility] OR;  Service: Neurosurgery;  Laterality: N/A;  C4-5 C5-6 C6-7 Anterior cervical decompression/discectomy/fusion   BACK SURGERY  12/05/2016   Lumbar, Dr. Venetia Maxon at [facility] center   COLONOSCOPY W/ POLYPECTOMY  10/13/14   Hyperplastic; recall 10 yrs   Sleep study  09/16/2019   Home sleep study--severe OSA->CPAP recommended   VASECTOMY     WISDOM TOOTH EXTRACTION      Outpatient Medications Prior to Visit  Medication Sig Dispense Refill   allopurinol (ZYLOPRIM) 300 MG tablet Take 1 tablet by mouth once daily 30 tablet 0   aspirin EC 81 MG tablet Take 81 mg by mouth daily.     gabapentin (NEURONTIN) 600 MG tablet Take 1 tablet (600 mg total) by mouth 2 (two) times daily. 180 tablet 1   hydrochlorothiazide (HYDRODIURIL) 25 MG tablet Take 1 tablet by mouth once daily 7 tablet 0   irbesartan (AVAPRO) 75 MG tablet Take 1 tablet by mouth once daily 7 tablet 0   Multiple Vitamins-Minerals (MENS MULTIVITAMIN) TABS Take 1 tablet by mouth  daily.     pioglitazone (ACTOS) 45 MG tablet Take 1 tablet by mouth once daily 7 tablet 0   Plant Sterols and Stanols (CHOLESTOFF PO) Take by mouth daily.     Polyethyl Glycol-Propyl Glycol 0.4-0.3 % SOLN Place 1-2 drops into both eyes 4 (four) times daily as needed (for dry eyes.).     No facility-administered medications prior to visit.    Allergies  Allergen Reactions   Metformin And Related Diarrhea    Nausea and diarrhea   Prednisone Other (See Comments)    Headaches, muscle aches, fatigue   Lisinopril Cough    Review of Systems As per HPI  PE:    05/09/2023    8:07 AM 05/09/2023    8:01 AM 11/05/2022   10:08 AM  Vitals with BMI   Height  5' 5.5" 5' 5.5"  Weight  206 lbs 6 oz 200 lbs 6 oz  BMI  33.81 32.83  Systolic 120 154 696  Diastolic 72 78 71  Pulse  65 60     Physical Exam  Gen: Alert, well appearing.  Patient is oriented to person, place, time, and situation. CV: RRR, no m/r/g.   LUNGS: CTA bilat, nonlabored resps, good aeration in all lung fields. EXT: no clubbing or cyanosis.  no edema.    LABS:  Last CBC Lab Results  Component Value Date   WBC 6.2 08/06/2022   HGB 17.0 08/06/2022   HCT 49.8 08/06/2022   MCV 87.4 08/06/2022   MCH 29.8 08/06/2022   RDW 12.9 08/06/2022   PLT 202 08/06/2022   Last metabolic panel Lab Results  Component Value Date   GLUCOSE 132 (H) 11/05/2022   NA 138 11/05/2022   K 5.0 11/05/2022   CL 100 11/05/2022   CO2 27 11/05/2022   BUN 23 11/05/2022   CREATININE 1.06 11/05/2022   GFR 74.58 10/22/2022   CALCIUM 9.4 11/05/2022   PROT 6.4 08/06/2022   ALBUMIN 4.7 03/18/2019   BILITOT 0.6 08/06/2022   ALKPHOS 77 03/18/2019   AST 14 08/06/2022   ALT 21 08/06/2022   ANIONGAP 8 03/05/2017   Last lipids Lab Results  Component Value Date   CHOL 194 08/06/2022   HDL 64 08/06/2022   LDLCALC 109 (H) 08/06/2022   TRIG 105 08/06/2022   CHOLHDL 3.0 08/06/2022   Last hemoglobin A1c Lab Results  Component Value Date   HGBA1C 6.1 (A) 10/22/2022   HGBA1C 6.1 10/22/2022   HGBA1C 6.1 10/22/2022   HGBA1C 6.1 10/22/2022   Last thyroid functions Lab Results  Component Value Date   TSH 2.45 08/06/2022   Last vitamin B12 and Folate Lab Results  Component Value Date   VITAMINB12 515 08/08/2015   IMPRESSION AND PLAN:  1) DM w/out complication, oral med-->great control with much improved low carb diet lately. POC Hba1c today 5.6% today. He'll consider a trial off actos to see things go.  2) HTN, well controlled on irbesartan 75mg  every day and hydrochlorothiazide 25mg  every day.  An After Visit Summary was printed and given to the patient.  FOLLOW UP: No  follow-ups on file. Next CPE 07/2023 Signed:  Santiago Bumpers, MD           05/09/2023

## 2023-06-03 ENCOUNTER — Other Ambulatory Visit: Payer: Self-pay | Admitting: Family Medicine

## 2023-07-02 NOTE — Patient Instructions (Incomplete)
Please continue using your CPAP regularly. While your insurance requires that you use CPAP at least 4 hours each night on 70% of the nights, I recommend, that you not skip any nights and use it throughout the night if you can. Getting used to CPAP and staying with the treatment long term does take time and patience and discipline. Untreated obstructive sleep apnea when it is moderate to severe can have an adverse impact on cardiovascular health and raise her risk for heart disease, arrhythmias, hypertension, congestive heart failure, stroke and diabetes. Untreated obstructive sleep apnea causes sleep disruption, nonrestorative sleep, and sleep deprivation. This can have an impact on your day to day functioning and cause daytime sleepiness and impairment of cognitive function, memory loss, mood disturbance, and problems focussing. Using CPAP regularly can improve these symptoms.  We will update supply orders, today. Contact DME regarding concerns of water chamber leaking. They should be able to troubleshoot this with you.   Follow up in 1 year

## 2023-07-02 NOTE — Progress Notes (Unsigned)
PATIENT: Derrick Willis DOB: August 20, 1961  REASON FOR VISIT: follow up HISTORY FROM: patient  No chief complaint on file.     HISTORY OF PRESENT ILLNESS:  07/02/23 ALL: Derrick Willis returns for follow up for OSA on CPAP.     05/09/2022 ALL: Derrick Willis returns for follow up for OSA on CPAP. He continues to do well. He is using CPAP nightly for about 6.5 hr. He admits that he may not use if out of town over night. He does take CPAP on longer trips. He changed to a nasal mask and feels it fits better. Leak has improved. He does note improvement in sleep quality and daytime energy when using CPAP.     05/07/2021 ALL: Derrick Willis returns for follow up for OSA on CPAP. He reports doing well. He is using CPAP most every night. He does note significant benefit when using CPAP. He does report needing to change his mask. He can tell that air is leaking when he wakes in the mornings. Otherwise, he is feeling well and without complaints.     05/04/2020 ALL:  Derrick Willis is a 62 y.o. male here today for follow up for OSA on CPAP.   Compliance report dated 04/03/2020 through 05/02/2020 reveals that he used CPAP 27 of the past 30 days for compliance of 90%.  He is CPAP greater than 4 hours 27 of the past 30 days for compliance of 90%.  Average usage on days used was 6 hours and 38 minutes.  Residual AHI was 0.6 on 6 to 18 cm of water and an EPR of 2.  There was no significant leak noted.  HISTORY: (copied from my note on 11/02/2019)  Derrick Willis is a 62 y.o. male here today for follow up OSA recently started on CPAP therapy. HST revealed total AHI of 31.5/hr and REM sleep AHI of 52.3/hr. No prolonged hypoxemia noted.  Derrick Willis reports that he is doing very well on CPAP therapy.  He notes significant improvement in sleep quality and feels less fatigued throughout the day.  He is now sleeping through the night without waking to use the restroom.  He denies any difficulty with his machine.   Compliance report dated  10/02/2019 through 10/31/2019 reveals that he is used CPAP 30 of the last 30 days for compliance of 100%.  He used CPAP greater than 4 hours all 30 days for compliance of 100%.  Average usage was 6 hours and 35 minutes.  Residual AHI was 1.2 on 6 to 18 cm of water and an EPR of 2.  There was no significant leak noted.   HISTORY: (copied from  note on 09/06/2019)   Derrick Willis is a 63 year old Caucasian male patient seen upon referral on 09/06/2019 .  Chief concern according to patient :  I have a CPAP from years ago, but it didn't do me any good. I had the study in [place], [place] at [facility].  The machine was mailed to me and I never followed up.    Derrick Willis had childhood asthma it lasted probably through his 54s as long as he was smoking which she quit around age 37.  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   He has a history of carpal tunnel syndrome surgically treated by Dr. Venetia Maxon 2018 cervical myelopathy and surgical fusion December 2019 by Dr. Venetia Maxon, lower back degenerative disease.  Gout affecting knee and toe, hyperkalemia, hyperlipidemia, hypertension, in June 2019 Dr. Larina Bras evaluated him for paresthesias of both hands.  Is also history of umbilical hernia in 2016.  And diabetes mellitus.   DOT evaluation questioned his lack of CPAP use and asked for re-evaluation. He is also seeing Dr Venetia Maxon next week.     Sleep relevant medical history: Nocturia- one, no Tonsillectomy , had cervical spine surgery, but no  deviated septum or sinus surgery, no UPPP.   he had tubes in his ears at  kindergarten age.  Family medical /sleep history:  other family member on CPAP with OSA, insomnia, sleep walkers.    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 and lives in a household alone with his dog.  He is divorced, his wife reportedly was very bothered by his snoring.  He has 2 adult sons, one in DO school.  The patient currently works. Tobacco use; quit at age 75 .   ETOH use - 2-3 drinks a week, Caffeine intake in form of Coffee( part decaffeinated 4-6 cups a day ) Soda( none ) Tea ( once I a while ) or energy drinks. Regular exercise in form of walking     Sleep habits are as follows: The patient's dinner time is between 6-8  PM. The patient goes to bed at 10 PM and continues to sleep for many hours,. The preferred sleep position is sideways, with the support of 2 pillows. Dreams are reportedly frequent.  6 AM is the usual rise time. The patient wakes up spontaneously at 5-6 AM He reports  feeling refreshed or restored in AM, with symptoms such as dry mouth, but no morning headaches, and only mild residual fatigue.  Naps are taken in frequently    REVIEW OF SYSTEMS: Out of a complete 14 system review of symptoms, the patient complains only of the following symptoms, none and all other reviewed systems are negative.  ESS: 4/24, previously 7/24   ALLERGIES: Allergies  Allergen Reactions   Metformin And Related Diarrhea    Nausea and diarrhea   Prednisone Other (See Comments)    Headaches, muscle aches, fatigue   Lisinopril Cough    HOME MEDICATIONS: Outpatient Medications Prior to Visit  Medication Sig Dispense Refill   allopurinol (ZYLOPRIM) 300 MG tablet Take 1 tablet by mouth once daily 30 tablet 0   aspirin EC 81 MG tablet Take 81 mg by mouth daily.     gabapentin (NEURONTIN) 600 MG tablet Take 1 tablet (600 mg total) by mouth 2 (two) times daily. 180 tablet 1   hydrochlorothiazide (HYDRODIURIL) 25 MG tablet Take 1  tablet (25 mg total) by mouth daily. 90 tablet 1   irbesartan (AVAPRO) 75 MG tablet Take 1 tablet (75 mg total) by mouth daily. 90 tablet 1   Multiple Vitamins-Minerals (MENS MULTIVITAMIN) TABS Take 1 tablet by mouth daily.     pioglitazone (ACTOS) 45 MG tablet Take 1 tablet (45 mg total) by mouth daily. 90 tablet 1   Plant Sterols and Stanols (CHOLESTOFF PO) Take by mouth daily.     Polyethyl Glycol-Propyl Glycol 0.4-0.3 % SOLN Place 1-2 drops into both eyes 4 (four) times daily as  needed (for dry eyes.).     No facility-administered medications prior to visit.    PAST MEDICAL HISTORY: Past Medical History:  Diagnosis Date   Asthma    "grew out of it", esp after quitting smoking arond age 44 yrs old   Atypical chest pain 06/12/2014   Carpal tunnel syndrome    bilat, L>>R (Dr. Venetia Maxon).  Hand symptoms prompted referral to rheum--lab w/u NEG.  Wrist splinting x 6 wks advised, not much help so low dose prednisone rx'd, then pt had to get steroid injection into L carpal tunnel 12/2016.   Cervical myelopathy [facility])    Surgical fusion--Dr Venetia Maxon (cerv spinal stenosis).  Stable as of 08/2019 and 10/2020 neurosurg f/u.   Chronic low back pain    COVID-19 virus infection 05/2020   Diabetes mellitus without complication (HCC)    No diab retinop as of 12/2014   Gout    Knee and toe--allopurinol since approx 2012 and has had no flares since being on this med (1/2 of 300 mg tab qd)   History of hyperkalemia 2015/2016   low K diet; cut lisinopril from 10mg  to 5mg  qd 11/2014.   Hyperlipidemia    med x 1 trial=adverse side effects so he stopped it.  LDL 130s 08/2014, then dx'd with DM 2 shortly after, pt refused another trial of statin 05/2017 and 2020.   Hypertension    MDD (major depressive disorder), recurrent episode (HCC)    initiated tx 09/2020   OSA (obstructive sleep apnea)    Remote past: had cpap but didn't use much .  REPEAT HOME SLEEP STUDY 09/16/19-> severe OSA, pt got on CPAP after this.    Paresthesia of both hands    Dr. Venetia Maxon: MRI 01/2018: no acute abnormality.  Stable-to-improved chronic spine/spinal cord changes, fusion good. MRI stable 10/2020->cont gabapentin hs   Statin intolerance    Umbilical hernia 2016    PAST SURGICAL HISTORY: Past Surgical History:  Procedure Laterality Date   ANTERIOR CERVICAL DECOMP/DISCECTOMY FUSION N/A 03/07/2017   Procedure: Cervical four-five, Cervical five-six, Cerivcal six- seven Anterior cervical decompression/discectomy/fusion WITH CERVICAL FIVE COREPECTOMY;  Surgeon: Maeola Harman, MD;  [facility];  Service: Neurosurgery;  Laterality: N/A;  C4-5 C5-6 C6-7 Anterior cervical decompression/discectomy/fusion   BACK SURGERY  12/05/2016   Lumbar, Dr. Venetia Maxon at [facility] surgical center   COLONOSCOPY W/ POLYPECTOMY  10/13/14   Hyperplastic; recall 10 yrs   Sleep study  09/16/2019   Home sleep study--severe OSA->CPAP recommended   VASECTOMY     WISDOM TOOTH EXTRACTION      FAMILY HISTORY: Family History  Problem Relation Age of Onset   Arthritis Mother    Arthritis Father    Hyperlipidemia Father    Hypertension Father    Colon polyps Neg Hx    Esophageal cancer Neg Hx    Rectal cancer Neg Hx    Stomach cancer Neg Hx     SOCIAL HISTORY: Social History   Socioeconomic History   Marital status: Divorced    Spouse name: Not on file   Number of children: Not on file   Years of education: Not on file   Highest education level: Not on file  Occupational History   Not on file  Tobacco Use   Smoking status: Former    Current packs/day: 0.00    Average packs/day: 1.5 packs/day for 12.0 years (18.0 ttl pk-yrs)    Types: Cigars, Cigarettes    Start date: 05/31/1978    Quit date: 05/31/1990  Years since quitting: 33.1   Smokeless tobacco: Never   Tobacco comments:    Quit smoking Cigars 10/2015  Vaping Use   Vaping status: Never Used  Substance and Sexual Activity   Alcohol use: Yes    Alcohol/week: 1.0 - 2.0 standard  drink of alcohol    Types: 1 - 2 Glasses of wine per week    Comment: 2-3 x weekly   Drug use: No   Sexual activity: Not on file  Other Topics Concern   Not on file  Social History Narrative   Divorced.  Two children, both young adults (one son plans to go to med school).   Occupation: Grading contractor-also a Visual merchandiser.   Education: 2 years of college.   Orig from [place], still lives there.   Tob 30 pack-yr hx, quit around age 86.   Social drinker.     No regular exercise.  Eating habits poor.   Social Determinants of Health   Financial Resource Strain: Not on file  Food Insecurity: Not on file  Transportation Needs: Not on file  Physical Activity: Not on file  Stress: Not on file  Social Connections: Not on file  Intimate Partner Violence: Not on file      PHYSICAL EXAM  There were no vitals filed for this visit.    There is no height or weight on file to calculate BMI.  Generalized: Well developed, in no acute distress  Cardiology: normal rate and rhythm, no murmur noted Respiratory: clear to auscultation bilaterally  Neurological examination  Mentation: Alert oriented to time, place, history taking. Follows all commands speech and language fluent Cranial nerve II-XII: Pupils were equal round reactive to light. Extraocular movements were full, visual field were full  Motor: The motor testing reveals 5 over 5 strength of all 4 extremities. Good symmetric motor tone is noted throughout.  Gait and station: Gait is normal.    DIAGNOSTIC DATA (LABS, IMAGING, TESTING) - I reviewed patient records, labs, notes, testing and imaging myself where available.      No data to display           Lab Results  Component Value Date   WBC 6.2 08/06/2022   HGB 17.0 08/06/2022   HCT 49.8 08/06/2022   MCV 87.4 08/06/2022   PLT 202 08/06/2022      Component Value Date/Time   NA 142 05/09/2023 0816   K 4.7 05/09/2023 0816   CL 107 05/09/2023 0816   CO2 28 05/09/2023  0816   GLUCOSE 134 (H) 05/09/2023 0816   BUN 15 05/09/2023 0816   CREATININE 0.97 05/09/2023 0816   CREATININE 1.06 11/05/2022 1040   CALCIUM 9.4 05/09/2023 0816   PROT 6.4 08/06/2022 0821   ALBUMIN 4.7 03/18/2019 0826   AST 14 08/06/2022 0821   ALT 21 08/06/2022 0821   ALKPHOS 77 03/18/2019 0826   BILITOT 0.6 08/06/2022 0821   GFRNONAA >60 03/05/2017 0951   GFRAA >60 03/05/2017 0951   Lab Results  Component Value Date   CHOL 194 08/06/2022   HDL 64 08/06/2022   LDLCALC 109 (H) 08/06/2022   TRIG 105 08/06/2022   CHOLHDL 3.0 08/06/2022   Lab Results  Component Value Date   HGBA1C 5.6 05/09/2023   HGBA1C 5.6 05/09/2023   HGBA1C 5.6 (A) 05/09/2023   HGBA1C 5.6 05/09/2023   Lab Results  Component Value Date   VITAMINB12 515 08/08/2015   Lab Results  Component Value Date   TSH 2.45 08/06/2022  ASSESSMENT AND PLAN 62 y.o. year old male  has a past medical history of Asthma, Atypical chest pain (06/12/2014), Carpal tunnel syndrome, Cervical myelopathy (HCC), Chronic low back pain, COVID-19 virus infection (05/2020), Diabetes mellitus without complication (HCC), Gout, History of hyperkalemia (2015/2016), Hyperlipidemia, Hypertension, MDD (major depressive disorder), recurrent episode (HCC), OSA (obstructive sleep apnea), Paresthesia of both hands, Statin intolerance, and Umbilical hernia (2016). here with   No diagnosis found.   [place] is doing very well on CPAP therapy.  Compliance report reveals optimal compliance.  He was encouraged to continue using CPAP nightly and for greater than 4 hours each night. Supply orders updated. Healthy lifestyle habits encouraged.  He will follow-up with primary care as directed.  He will return to see [place] in 1 year, sooner if needed.  He verbalizes understanding and agreement with this plan.   No orders of the defined types were placed in this encounter.     No orders of the defined types were placed in this encounter.      Shawnie Dapper, FNP-C 07/02/2023, 8:07 AM [facility] [address], [address] [place], [place] [address] [phone]

## 2023-07-03 ENCOUNTER — Telehealth: Payer: Self-pay

## 2023-07-03 ENCOUNTER — Encounter: Payer: Self-pay | Admitting: Family Medicine

## 2023-07-03 ENCOUNTER — Ambulatory Visit: Payer: 59 | Admitting: Family Medicine

## 2023-07-03 VITALS — BP 168/88 | HR 59 | Ht 65.0 in | Wt 203.5 lb

## 2023-07-03 DIAGNOSIS — G4733 Obstructive sleep apnea (adult) (pediatric): Secondary | ICD-10-CM

## 2023-07-03 NOTE — Telephone Encounter (Signed)
Please see Mychart message from today 07/03/2023

## 2023-07-05 ENCOUNTER — Other Ambulatory Visit: Payer: Self-pay | Admitting: Family Medicine

## 2023-08-08 ENCOUNTER — Encounter: Payer: 59 | Admitting: Family Medicine

## 2023-08-10 ENCOUNTER — Other Ambulatory Visit: Payer: Self-pay | Admitting: Family Medicine

## 2023-09-09 ENCOUNTER — Other Ambulatory Visit: Payer: Self-pay | Admitting: Family Medicine

## 2023-09-10 ENCOUNTER — Ambulatory Visit (INDEPENDENT_AMBULATORY_CARE_PROVIDER_SITE_OTHER): Payer: 59 | Admitting: Family Medicine

## 2023-09-10 ENCOUNTER — Encounter: Payer: Self-pay | Admitting: Family Medicine

## 2023-09-10 VITALS — BP 137/84 | HR 64 | Ht 65.0 in | Wt 204.6 lb

## 2023-09-10 DIAGNOSIS — Z0000 Encounter for general adult medical examination without abnormal findings: Secondary | ICD-10-CM

## 2023-09-10 DIAGNOSIS — Z125 Encounter for screening for malignant neoplasm of prostate: Secondary | ICD-10-CM | POA: Diagnosis not present

## 2023-09-10 DIAGNOSIS — I10 Essential (primary) hypertension: Secondary | ICD-10-CM

## 2023-09-10 DIAGNOSIS — Z7984 Long term (current) use of oral hypoglycemic drugs: Secondary | ICD-10-CM

## 2023-09-10 DIAGNOSIS — Z23 Encounter for immunization: Secondary | ICD-10-CM

## 2023-09-10 DIAGNOSIS — E119 Type 2 diabetes mellitus without complications: Secondary | ICD-10-CM

## 2023-09-10 LAB — COMPREHENSIVE METABOLIC PANEL
ALT: 17 U/L (ref 0–53)
AST: 16 U/L (ref 0–37)
Albumin: 4.7 g/dL (ref 3.5–5.2)
Alkaline Phosphatase: 66 U/L (ref 39–117)
BUN: 23 mg/dL (ref 6–23)
CO2: 31 meq/L (ref 19–32)
Calcium: 9.8 mg/dL (ref 8.4–10.5)
Chloride: 104 meq/L (ref 96–112)
Creatinine, Ser: 1.02 mg/dL (ref 0.40–1.50)
GFR: 78.5 mL/min (ref >60.00)
Glucose, Bld: 147 mg/dL — ABNORMAL HIGH (ref 70–99)
Potassium: 4.9 meq/L (ref 3.5–5.1)
Sodium: 142 meq/L (ref 135–145)
Total Bilirubin: 0.8 mg/dL (ref 0.2–1.2)
Total Protein: 6.9 g/dL (ref 6.0–8.3)

## 2023-09-10 LAB — LIPID PANEL
Cholesterol: 189 mg/dL (ref 0–200)
HDL: 65.7 mg/dL (ref >39.00)
LDL Cholesterol: 106 mg/dL — ABNORMAL HIGH (ref 0–99)
NonHDL: 123.77
Total CHOL/HDL Ratio: 3
Triglycerides: 89 mg/dL (ref 0.0–149.0)
VLDL: 17.8 mg/dL (ref 0.0–40.0)

## 2023-09-10 LAB — CBC
HCT: 51.3 % (ref 39.0–52.0)
Hemoglobin: 17.3 g/dL — ABNORMAL HIGH (ref 13.0–17.0)
MCHC: 33.6 g/dL (ref 30.0–36.0)
MCV: 91.6 fL (ref 78.0–100.0)
Platelets: 188 10*3/uL (ref 150.0–400.0)
RBC: 5.6 Mil/uL (ref 4.22–5.81)
RDW: 13.7 % (ref 11.5–15.5)
WBC: 7.5 10*3/uL (ref 4.0–10.5)

## 2023-09-10 LAB — MICROALBUMIN / CREATININE URINE RATIO
Creatinine,U: 161.9 mg/dL
Microalb Creat Ratio: 1 mg/g (ref 0.0–30.0)
Microalb, Ur: 1.6 mg/dL (ref 0.0–1.9)

## 2023-09-10 LAB — PSA: PSA: 1.86 ng/mL (ref 0.10–4.00)

## 2023-09-10 LAB — HEMOGLOBIN A1C: Hgb A1c MFr Bld: 6.4 % (ref 4.6–6.5)

## 2023-09-10 MED ORDER — ALLOPURINOL 300 MG PO TABS: 300.0000 mg | ORAL_TABLET | Freq: Every day | ORAL | 3 refills | Status: DC

## 2023-09-10 MED ORDER — AMLODIPINE BESYLATE 5 MG PO TABS: 5.0000 mg | ORAL_TABLET | Freq: Every day | ORAL | 0 refills | Status: DC

## 2023-09-10 NOTE — Progress Notes (Signed)
 Office Note 09/10/2023  CC:  Chief Complaint  Patient presents with   Annual Exam    Pt is fasting.    Patient is a 63 y.o. male who is here for annual health maintenance exam and 6-month follow-up diabetes and hypertension.. A/P as of last visit: "1) DM w/out complication, oral med-->great control with much improved low carb diet lately. POC Hba1c today 5.6% today. He'll consider a trial off actos  to see things go.   2) HTN, well controlled on irbesartan  75mg  every day and hydrochlorothiazide  25mg  every day."  INTERIM HX: He discontinued his bp meds a few months ago b/c side effects which he doesn't get specific on (easy fatigability). Home bp's consistently 120s-140, diast 70s, P 50s.  Stopped pioglit as well. Says feeling better since getting off bp meds.   His only meds are allopurinol , aspirin , and colestoff. He rarely takes gabapentin  for left arm radiculopathy symptoms.  Diet is decent, walks most days for exercises.  Past Medical History:  Diagnosis Date   Asthma    "grew out of it", esp after quitting smoking arond age 35 yrs old   Atypical chest pain 06/12/2014   Carpal tunnel syndrome    bilat, L>>R (Dr. Nigel Bart).  Hand symptoms prompted referral to rheum--lab w/u NEG.  Wrist splinting x 6 wks advised, not much help so low dose prednisone rx'd, then pt had to get steroid injection into L carpal tunnel 12/2016.   Cervical myelopathy [facility])    Surgical fusion--Dr Nigel Bart (cerv spinal stenosis).  Stable as of 08/2019 and 10/2020 neurosurg f/u.   Chronic low back pain    COVID-19 virus infection 05/2020   Diabetes mellitus without complication (HCC)    No diab retinop as of 12/2014   Gout    Knee and toe--allopurinol  since approx 2012 and has had no flares since being on this med (1/2 of 300 mg tab qd)   History of hyperkalemia 2015/2016   low K diet; cut lisinopril  from 10mg  to 5mg  qd 11/2014.   Hyperlipidemia    med x 1 trial=adverse side effects so he stopped it.   LDL 130s 08/2014, then dx'd with DM 2 shortly after, pt refused another trial of statin 05/2017 and 2020.   Hypertension    MDD (major depressive disorder), recurrent episode (HCC)    initiated tx 09/2020   OSA (obstructive sleep apnea)    Remote past: had cpap but didn't use much .  REPEAT HOME SLEEP STUDY 09/16/19-> severe OSA, pt got on CPAP after this.   Paresthesia of both hands    Dr. Nigel Bart: MRI 01/2018: no acute abnormality.  Stable-to-improved chronic spine/spinal cord changes, fusion good. MRI stable 10/2020->cont gabapentin  hs   Statin intolerance    Umbilical hernia 2016    Past Surgical History:  Procedure Laterality Date   ANTERIOR CERVICAL DECOMP/DISCECTOMY FUSION N/A 03/07/2017   Procedure: Cervical four-five, Cervical five-six, Cerivcal six- seven Anterior cervical decompression/discectomy/fusion WITH CERVICAL FIVE COREPECTOMY;  Surgeon: Manya Sells, MD;  Location: [facility] OR;  Service: Neurosurgery;  Laterality: N/A;  C4-5 C5-6 C6-7 Anterior cervical decompression/discectomy/fusion   BACK SURGERY  12/05/2016   Lumbar, Dr. Nigel Bart at [facility] surgical center   COLONOSCOPY W/ POLYPECTOMY  10/13/14   Hyperplastic; recall 10 yrs   Sleep study  09/16/2019   Home sleep study--severe OSA->CPAP recommended   VASECTOMY     WISDOM TOOTH EXTRACTION      Family History  Problem Relation Age of Onset   Arthritis Mother  Arthritis Father    Hyperlipidemia Father    Hypertension Father    Colon polyps Neg Hx    Esophageal cancer Neg Hx    Rectal cancer Neg Hx    Stomach cancer Neg Hx     Social History   Socioeconomic History   Marital status: Divorced    Spouse name: Not on file   Number of children: Not on file   Years of education: Not on file   Highest education level: Not on file  Occupational History   Not on file  Tobacco Use   Smoking status: Former    Current packs/day: 0.00    Average packs/day: 1.5 packs/day for 12.0 years (18.0 ttl pk-yrs)    Types: Cigars,  Cigarettes    Start date: 05/31/1978    Quit date: 05/31/1990    Years since quitting: 33.3   Smokeless tobacco: Never   Tobacco comments:    Quit smoking Cigars 10/2015  Vaping Use   Vaping status: Never Used  Substance and Sexual Activity   Alcohol use: Yes    Alcohol/week: 1.0 - 2.0 standard drink of alcohol    Types: 1 - 2 Glasses of wine per week    Comment: 2-3 x weekly   Drug use: No   Sexual activity: Not on file  Other Topics Concern   Not on file  Social History Narrative   Divorced.  Two children, both young adults (one son plans to go to med school).   Occupation: Grading contractor-also a Visual merchandiser.   Education: 2 years of college.   Orig from [place], still lives there.   Tob 30 pack-yr hx, quit around age 64.   Social drinker.     No regular exercise.  Eating habits poor.   Social Drivers of Corporate investment banker Strain: Not on file  Food Insecurity: Not on file  Transportation Needs: Not on file  Physical Activity: Not on file  Stress: Not on file  Social Connections: Not on file  Intimate Partner Violence: Not on file    Outpatient Medications Prior to Visit  Medication Sig Dispense Refill   gabapentin  (NEURONTIN ) 600 MG tablet Take 1 tablet (600 mg total) by mouth 2 (two) times daily. 180 tablet 1   Multiple Vitamins-Minerals (MENS MULTIVITAMIN) TABS Take 1 tablet by mouth daily.     Plant Sterols and Stanols (CHOLESTOFF PO) Take by mouth daily.     Polyethyl Glycol-Propyl Glycol 0.4-0.3 % SOLN Place 1-2 drops into both eyes 4 (four) times daily as needed (for dry eyes.).     aspirin  EC 81 MG tablet Take 81 mg by mouth daily. (Patient not taking: Reported on 07/03/2023)     allopurinol  (ZYLOPRIM ) 300 MG tablet Take 1 tablet by mouth once daily 30 tablet 0   hydrochlorothiazide  (HYDRODIURIL ) 25 MG tablet Take 1 tablet (25 mg total) by mouth daily. (Patient not taking: Reported on 09/10/2023) 90 tablet 1   irbesartan  (AVAPRO ) 75 MG tablet Take 1 tablet (75  mg total) by mouth daily. (Patient not taking: Reported on 09/10/2023) 90 tablet 1   pioglitazone  (ACTOS ) 45 MG tablet Take 1 tablet (45 mg total) by mouth daily. (Patient not taking: Reported on 09/10/2023) 90 tablet 1   No facility-administered medications prior to visit.    Allergies  Allergen Reactions   Metformin  And Related Diarrhea    Nausea and diarrhea   Prednisone Other (See Comments)    Headaches, muscle aches, fatigue   Lisinopril  Cough  Review of Systems  Constitutional:  Negative for appetite change, chills, fatigue and fever.  HENT:  Negative for congestion, dental problem, ear pain and sore throat.   Eyes:  Negative for discharge, redness and visual disturbance.  Respiratory:  Negative for cough, chest tightness, shortness of breath and wheezing.   Cardiovascular:  Negative for chest pain, palpitations and leg swelling.  Gastrointestinal:  Negative for abdominal pain, blood in stool, diarrhea, nausea and vomiting.  Genitourinary:  Negative for difficulty urinating, dysuria, flank pain, frequency, hematuria and urgency.  Musculoskeletal:  Negative for arthralgias, back pain, joint swelling, myalgias and neck stiffness.  Skin:  Negative for pallor and rash.  Neurological:  Negative for dizziness, speech difficulty, weakness and headaches.  Hematological:  Negative for adenopathy. Does not bruise/bleed easily.  Psychiatric/Behavioral:  Negative for confusion and sleep disturbance. The patient is not nervous/anxious.     PE;    09/10/2023    9:23 AM 07/03/2023    1:06 PM 05/09/2023    8:07 AM  Vitals with BMI  Height 5\' 5"  5\' 5"    Weight 204 lbs 10 oz 203 lbs 8 oz   BMI 34.05 33.86   Systolic 137 168 161  Diastolic 84 88 72  Pulse 64 59    Gen: Alert, well appearing.  Patient is oriented to person, place, time, and situation. AFFECT: pleasant, lucid thought and speech. ENT: Ears: EACs clear, normal epithelium.  TMs with good light reflex and landmarks  bilaterally.  Eyes: no injection, icteris, swelling, or exudate.  EOMI, PERRLA. Nose: no drainage or turbinate edema/swelling.  No injection or focal lesion.  Mouth: lips without lesion/swelling.  Oral mucosa pink and moist.  Dentition intact and without obvious caries or gingival swelling.  Oropharynx without erythema, exudate, or swelling.  Neck: supple/nontender.  No LAD, mass, or TM.  Carotid pulses 2+ bilaterally, without bruits. CV: RRR, no m/r/g.   LUNGS: CTA bilat, nonlabored resps, good aeration in all lung fields. ABD: soft, NT, ND, BS normal.  No hepatospenomegaly or mass.  No bruits. EXT: no clubbing, cyanosis, or edema.  Musculoskeletal: no joint swelling, erythema, warmth, or tenderness.  ROM of all joints intact. Skin - no sores or suspicious lesions or rashes or color changes Foot exam -  no swelling, tenderness or skin or vascular lesions. Color and temperature is normal. Sensation is intact. Peripheral pulses are palpable. Toenails are normal.  Pertinent labs:  Lab Results  Component Value Date   TSH 2.45 08/06/2022   Lab Results  Component Value Date   WBC 6.2 08/06/2022   HGB 17.0 08/06/2022   HCT 49.8 08/06/2022   MCV 87.4 08/06/2022   PLT 202 08/06/2022   Lab Results  Component Value Date   CREATININE 0.97 05/09/2023   BUN 15 05/09/2023   NA 142 05/09/2023   K 4.7 05/09/2023   CL 107 05/09/2023   CO2 28 05/09/2023   Lab Results  Component Value Date   ALT 21 08/06/2022   AST 14 08/06/2022   ALKPHOS 77 03/18/2019   BILITOT 0.6 08/06/2022   Lab Results  Component Value Date   CHOL 194 08/06/2022   Lab Results  Component Value Date   HDL 64 08/06/2022   Lab Results  Component Value Date   LDLCALC 109 (H) 08/06/2022   Lab Results  Component Value Date   TRIG 105 08/06/2022   Lab Results  Component Value Date   CHOLHDL 3.0 08/06/2022   Lab Results  Component Value Date  PSA 1.33 08/06/2022   PSA 1.16 06/05/2021   PSA 1.35 03/18/2019    Lab Results  Component Value Date   HGBA1C 5.6 05/09/2023   HGBA1C 5.6 05/09/2023   HGBA1C 5.6 (A) 05/09/2023   HGBA1C 5.6 05/09/2023   ASSESSMENT AND PLAN:   No problem-specific Assessment & Plan notes found for this encounter.  #1 health maintenance exam: Reviewed age and gender appropriate health maintenance issues (prudent diet, regular exercise, health risks of tobacco and excessive alcohol, use of seatbelts, fire alarms in home, use of sunscreen).  Also reviewed age and gender appropriate health screening as well as vaccine recommendations. Vaccines: Prevnar 20->declined.  Shingrix-->declined.  Flu-->declined. Labs: fasting HP, Hba1c, and PSA ordered. Prostate ca screening: PSA today. Colon ca screening: recall 2026.  #2 hypertension, not well-controlled since self- discontinuation of his HCTZ and irbesartan .  He does feel better off of these medications though. Will start amlodipine  5 mg a day and he will monitor daily and we will review his numbers in 2 weeks. Electrolytes and creatinine today.  3.  Diabetes without complication. He discontinued his pioglitazone . A1c was excellent about 4 months ago. Repeat hemoglobin A1c today. Urine microalbumin/creatinine today. Feet exam normal today.  An After Visit Summary was printed and given to the patient.  FOLLOW UP:  Return in about 2 weeks (around 09/24/2023) for f/u BP.  Signed:  Arletha Lady, MD           09/10/2023

## 2023-09-24 ENCOUNTER — Encounter: Payer: Self-pay | Admitting: Family Medicine

## 2023-09-24 ENCOUNTER — Ambulatory Visit: Payer: 59 | Admitting: Family Medicine

## 2023-09-24 VITALS — BP 131/79 | HR 60 | Wt 210.0 lb

## 2023-09-24 DIAGNOSIS — I10 Essential (primary) hypertension: Secondary | ICD-10-CM

## 2023-09-24 MED ORDER — AMLODIPINE BESYLATE 10 MG PO TABS: 10.0000 mg | ORAL_TABLET | Freq: Every day | ORAL | 3 refills | Status: AC

## 2023-09-24 NOTE — Progress Notes (Signed)
OFFICE VISIT  09/24/2023  CC:  Chief Complaint  Patient presents with   Hypertension    F/U.     Patient is a 63 y.o. male who presents for 2-week follow-up uncontrolled hypertension. A/P as of last visit: "1 hypertension, not well-controlled since self- discontinuation of his HCTZ and irbesartan.  He does feel better off of these medications though. Will start amlodipine 5 mg a day and he will monitor daily and we will review his numbers in 2 weeks. Electrolytes and creatinine today.   2.  Diabetes without complication. He discontinued his pioglitazone. A1c was excellent about 4 months ago. Repeat hemoglobin A1c today. Urine microalbumin/creatinine today. Feet exam normal today."  INTERIM HX: Derrick Willis feels well. No side effects from the amlodipine. Home blood pressures still in the 130s systolic.  70s diastolic.  Past Medical History:  Diagnosis Date   Asthma    "grew out of it", esp after quitting smoking arond age 43 yrs old   Atypical chest pain 06/12/2014   Carpal tunnel syndrome    bilat, L>>R (Dr. Venetia Maxon).  Hand symptoms prompted referral to rheum--lab w/u NEG.  Wrist splinting x 6 wks advised, not much help so low dose prednisone rx'd, then pt had to get steroid injection into L carpal tunnel 12/2016.   Cervical myelopathy [facility])    Surgical fusion--Dr Venetia Maxon (cerv spinal stenosis).  Stable as of 08/2019 and 10/2020 neurosurg f/u.   Chronic low back pain    COVID-19 virus infection 05/2020   Diabetes mellitus without complication (HCC)    No diab retinop as of 12/2014   Gout    Knee and toe--allopurinol since approx 2012 and has had no flares since being on this med (1/2 of 300 mg tab qd)   History of hyperkalemia 2015/2016   low K diet; cut lisinopril from 10mg  to 5mg  qd 11/2014.   Hyperlipidemia    med x 1 trial=adverse side effects so he stopped it.  LDL 130s 08/2014, then dx'd with DM 2 shortly after, pt refused another trial of statin 05/2017 and 2020.   Hypertension     MDD (major depressive disorder), recurrent episode (HCC)    initiated tx 09/2020   OSA (obstructive sleep apnea)    Remote past: had cpap but didn't use much .  REPEAT HOME SLEEP STUDY 09/16/19-> severe OSA, pt got on CPAP after this.   Paresthesia of both hands    Dr. Venetia Maxon: MRI 01/2018: no acute abnormality.  Stable-to-improved chronic spine/spinal cord changes, fusion good. MRI stable 10/2020->cont gabapentin hs   Statin intolerance    Umbilical hernia 2016    Past Surgical History:  Procedure Laterality Date   ANTERIOR CERVICAL DECOMP/DISCECTOMY FUSION N/A 03/07/2017   Procedure: Cervical four-five, Cervical five-six, Cerivcal six- seven Anterior cervical decompression/discectomy/fusion WITH CERVICAL FIVE COREPECTOMY;  Surgeon: Maeola Harman, MD;  Location: [facility] OR;  Service: Neurosurgery;  Laterality: N/A;  C4-5 C5-6 C6-7 Anterior cervical decompression/discectomy/fusion   BACK SURGERY  12/05/2016   Lumbar, Dr. Venetia Maxon at [facility] surgical center   COLONOSCOPY W/ POLYPECTOMY  10/13/14   Hyperplastic; recall 10 yrs   Sleep study  09/16/2019   Home sleep study--severe OSA->CPAP recommended   VASECTOMY     WISDOM TOOTH EXTRACTION      Outpatient Medications Prior to Visit  Medication Sig Dispense Refill   allopurinol (ZYLOPRIM) 300 MG tablet Take 1 tablet (300 mg total) by mouth daily. 90 tablet 3   amLODipine (NORVASC) 5 MG tablet Take 1 tablet (5  mg total) by mouth daily. 30 tablet 0   gabapentin (NEURONTIN) 600 MG tablet Take 1 tablet (600 mg total) by mouth 2 (two) times daily. 180 tablet 1   Multiple Vitamins-Minerals (MENS MULTIVITAMIN) TABS Take 1 tablet by mouth daily.     Plant Sterols and Stanols (CHOLESTOFF PO) Take by mouth daily.     aspirin EC 81 MG tablet Take 81 mg by mouth daily. (Patient not taking: Reported on 07/03/2023)     No facility-administered medications prior to visit.    Allergies  Allergen Reactions   Metformin And Related Diarrhea    Nausea and  diarrhea   Prednisone Other (See Comments)    Headaches, muscle aches, fatigue   Lisinopril Cough    Review of Systems As per HPI  PE:    09/24/2023    8:24 AM 09/10/2023    9:23 AM 07/03/2023    1:06 PM  Vitals with BMI  Height  5\' 5"  5\' 5"   Weight 210 lbs 204 lbs 10 oz 203 lbs 8 oz  BMI 34.95 34.05 33.86  Systolic 131 137 161  Diastolic 79 84 88  Pulse 60 64 59     Physical Exam  Gen: Alert, well appearing.  Patient is oriented to person, place, time, and situation. AFFECT: pleasant, lucid thought and speech. No further exam today  LABS:  Last metabolic panel Lab Results  Component Value Date   GLUCOSE 147 (H) 09/10/2023   NA 142 09/10/2023   K 4.9 09/10/2023   CL 104 09/10/2023   CO2 31 09/10/2023   BUN 23 09/10/2023   CREATININE 1.02 09/10/2023   GFR 78.50 09/10/2023   CALCIUM 9.8 09/10/2023   PROT 6.9 09/10/2023   ALBUMIN 4.7 09/10/2023   BILITOT 0.8 09/10/2023   ALKPHOS 66 09/10/2023   AST 16 09/10/2023   ALT 17 09/10/2023   ANIONGAP 8 03/05/2017   Lab Results  Component Value Date   HGBA1C 6.4 09/10/2023   IMPRESSION AND PLAN:  Uncontrolled hypertension, but improving on amlodipine 5 mg the last 2 weeks. Increase to 10 mg daily. Continue home BP monitoring and we will review these here in 2 weeks.  An After Visit Summary was printed and given to the patient.  FOLLOW UP: No follow-ups on file.  Signed:  Santiago Bumpers, MD           09/24/2023

## 2023-10-07 NOTE — Patient Instructions (Signed)

## 2023-10-08 ENCOUNTER — Encounter: Payer: Self-pay | Admitting: Family Medicine

## 2023-10-08 ENCOUNTER — Ambulatory Visit: Payer: 59 | Admitting: Family Medicine

## 2023-10-08 VITALS — BP 148/77 | HR 73 | Temp 98.6°F | Ht 65.0 in | Wt 212.6 lb

## 2023-10-08 DIAGNOSIS — I10 Essential (primary) hypertension: Secondary | ICD-10-CM

## 2023-10-08 NOTE — Progress Notes (Signed)
OFFICE VISIT  10/08/2023  CC:  Chief Complaint  Patient presents with   Medical Management of Chronic Issues   Patient is a 63 y.o. male who presents for 2-week follow-up uncontrolled hypertension. A/P as of last visit: "Uncontrolled hypertension, but improving on amlodipine 5 mg the last 2 weeks. Increase to 10 mg daily. Continue home BP monitoring and we will review these here in 2 weeks."  INTERIM HX: Home bp's 130/70s avg, nothing over 135 syst. Feels a little bloated/hands swollen feeling, mild, no edema though-->since getting on amlodipine.   Past Medical History:  Diagnosis Date   Asthma    "grew out of it", esp after quitting smoking arond age 66 yrs old   Atypical chest pain 06/12/2014   Carpal tunnel syndrome    bilat, L>>R (Dr. Venetia Maxon).  Hand symptoms prompted referral to rheum--lab w/u NEG.  Wrist splinting x 6 wks advised, not much help so low dose prednisone rx'd, then pt had to get steroid injection into L carpal tunnel 12/2016.   Cervical myelopathy [facility])    Surgical fusion--Dr Venetia Maxon (cerv spinal stenosis).  Stable as of 08/2019 and 10/2020 neurosurg f/u.   Chronic low back pain    COVID-19 virus infection 05/2020   Diabetes mellitus without complication (HCC)    No diab retinop as of 12/2014   Gout    Knee and toe--allopurinol since approx 2012 and has had no flares since being on this med (1/2 of 300 mg tab qd)   History of hyperkalemia 2015/2016   low K diet; cut lisinopril from 10mg  to 5mg  qd 11/2014.   Hyperlipidemia    med x 1 trial=adverse side effects so he stopped it.  LDL 130s 08/2014, then dx'd with DM 2 shortly after, pt refused another trial of statin 05/2017 and 2020.   Hypertension    MDD (major depressive disorder), recurrent episode (HCC)    initiated tx 09/2020   OSA (obstructive sleep apnea)    Remote past: had cpap but didn't use much .  REPEAT HOME SLEEP STUDY 09/16/19-> severe OSA, pt got on CPAP after this.   Paresthesia of both hands    Dr.  Venetia Maxon: MRI 01/2018: no acute abnormality.  Stable-to-improved chronic spine/spinal cord changes, fusion good. MRI stable 10/2020->cont gabapentin hs   Statin intolerance    Umbilical hernia 2016    Past Surgical History:  Procedure Laterality Date   ANTERIOR CERVICAL DECOMP/DISCECTOMY FUSION N/A 03/07/2017   Procedure: Cervical four-five, Cervical five-six, Cerivcal six- seven Anterior cervical decompression/discectomy/fusion WITH CERVICAL FIVE COREPECTOMY;  Surgeon: Maeola Harman, MD;  Location: [facility] OR;  Service: Neurosurgery;  Laterality: N/A;  C4-5 C5-6 C6-7 Anterior cervical decompression/discectomy/fusion   BACK SURGERY  12/05/2016   Lumbar, Dr. Venetia Maxon at [facility] surgical center   COLONOSCOPY W/ POLYPECTOMY  10/13/14   Hyperplastic; recall 10 yrs   Sleep study  09/16/2019   Home sleep study--severe OSA->CPAP recommended   VASECTOMY     WISDOM TOOTH EXTRACTION      Outpatient Medications Prior to Visit  Medication Sig Dispense Refill   allopurinol (ZYLOPRIM) 300 MG tablet Take 1 tablet (300 mg total) by mouth daily. 90 tablet 3   amLODipine (NORVASC) 10 MG tablet Take 1 tablet (10 mg total) by mouth daily. 90 tablet 3   aspirin EC 81 MG tablet Take 81 mg by mouth daily.     gabapentin (NEURONTIN) 600 MG tablet Take 1 tablet (600 mg total) by mouth 2 (two) times daily. 180 tablet 1  Multiple Vitamins-Minerals (MENS MULTIVITAMIN) TABS Take 1 tablet by mouth daily.     Plant Sterols and Stanols (CHOLESTOFF PO) Take by mouth daily.     No facility-administered medications prior to visit.    Allergies  Allergen Reactions   Metformin And Related Diarrhea    Nausea and diarrhea   Prednisone Other (See Comments)    Headaches, muscle aches, fatigue   Lisinopril Cough    Review of Systems As per HPI  PE:    10/08/2023    9:06 AM 10/08/2023    8:52 AM 09/24/2023    8:24 AM  Vitals with BMI  Height  5\' 5"    Weight  212 lbs 10 oz 210 lbs  BMI  35.38 34.95  Systolic 148 147 956   Diastolic 77 76 79  Pulse  73 60     Physical Exam  Gen: Alert, well appearing.  Patient is oriented to person, place, time, and situation. AFFECT: pleasant, lucid thought and speech. CV: RRR, no m/r/g.   LUNGS: CTA bilat, nonlabored resps, good aeration in all lung fields. EXT: no clubbing or cyanosis.  no edema.    LABS:  Last metabolic panel Lab Results  Component Value Date   GLUCOSE 147 (H) 09/10/2023   NA 142 09/10/2023   K 4.9 09/10/2023   CL 104 09/10/2023   CO2 31 09/10/2023   BUN 23 09/10/2023   CREATININE 1.02 09/10/2023   GFR 78.50 09/10/2023   CALCIUM 9.8 09/10/2023   PROT 6.9 09/10/2023   ALBUMIN 4.7 09/10/2023   BILITOT 0.8 09/10/2023   ALKPHOS 66 09/10/2023   AST 16 09/10/2023   ALT 17 09/10/2023   ANIONGAP 8 03/05/2017   Lab Results  Component Value Date   HGBA1C 6.4 09/10/2023   IMPRESSION AND PLAN:  HTN, well controlled on amlodipine 10mg  every day. Amlodipine with mild bloating s/e, tolerable at this time.  An After Visit Summary was printed and given to the patient.  FOLLOW UP: Return in about 6 months (around 04/06/2024) for routine chronic illness f/u.  Signed:  Santiago Bumpers, MD           10/08/2023

## 2023-11-27 ENCOUNTER — Ambulatory Visit: Payer: 59 | Admitting: Family Medicine

## 2023-12-09 NOTE — Progress Notes (Unsigned)
 OFFICE VISIT  12/10/2023  CC:  Chief Complaint  Patient presents with   Abdominal Pain    Lower, RLQ; started last Mon or Tues, got a little worse this past weekend but is barely noticeable now. Worsened ROM when bending over at the time of pain.    Patient is a 63 y.o. male who presents for lower abdominal pain.  HPI: 1 week ago he started to feel pain in the low abdomen.  Infraumbilical region seem to be most of the focus.  It did not hurt when sitting still or standing still but when he turns or bent at the waist it would hurt.  He feels a little more bloating lately than his usual but has no nausea or vomiting.  No diarrhea.  Appetite is good. He says he has had the umbilical hernia about 20 years and feels like he limits his activity some because of fear of making his abdomen uncomfortable.  He requests referral to general surgery today.  Of note, his pain has nearly completely resolved over the last 2 days.  Review of systems: No fever, no urinary urgency or frequency, no dysuria, no significant constipation lately. No melena or hematochezia.  Past Medical History:  Diagnosis Date   Asthma    "grew out of it", esp after quitting smoking arond age 24 yrs old   Atypical chest pain 06/12/2014   Carpal tunnel syndrome    bilat, L>>R (Dr. Venetia Maxon).  Hand symptoms prompted referral to rheum--lab w/u NEG.  Wrist splinting x 6 wks advised, not much help so low dose prednisone rx'd, then pt had to get steroid injection into L carpal tunnel 12/2016.   Cervical myelopathy [facility])    Surgical fusion--Dr Venetia Maxon (cerv spinal stenosis).  Stable as of 08/2019 and 10/2020 neurosurg f/u.   Chronic low back pain    COVID-19 virus infection 05/2020   Diabetes mellitus without complication (HCC)    No diab retinop as of 12/2014   Gout    Knee and toe--allopurinol since approx 2012 and has had no flares since being on this med (1/2 of 300 mg tab qd)   History of hyperkalemia 2015/2016   low K diet; cut  lisinopril from 10mg  to 5mg  qd 11/2014.   Hyperlipidemia    med x 1 trial=adverse side effects so he stopped it.  LDL 130s 08/2014, then dx'd with DM 2 shortly after, pt refused another trial of statin 05/2017 and 2020.   Hypertension    MDD (major depressive disorder), recurrent episode (HCC)    initiated tx 09/2020   OSA (obstructive sleep apnea)    Remote past: had cpap but didn't use much .  REPEAT HOME SLEEP STUDY 09/16/19-> severe OSA, pt got on CPAP after this.   Paresthesia of both hands    Dr. Venetia Maxon: MRI 01/2018: no acute abnormality.  Stable-to-improved chronic spine/spinal cord changes, fusion good. MRI stable 10/2020->cont gabapentin hs   Statin intolerance    Umbilical hernia 2016    Past Surgical History:  Procedure Laterality Date   ANTERIOR CERVICAL DECOMP/DISCECTOMY FUSION N/A 03/07/2017   Procedure: Cervical four-five, Cervical five-six, Cerivcal six- seven Anterior cervical decompression/discectomy/fusion WITH CERVICAL FIVE COREPECTOMY;  Surgeon: Maeola Harman, MD;  Location: [facility] OR;  Service: Neurosurgery;  Laterality: N/A;  C4-5 C5-6 C6-7 Anterior cervical decompression/discectomy/fusion   BACK SURGERY  12/05/2016   Lumbar, Dr. Venetia Maxon at [facility]   COLONOSCOPY W/ POLYPECTOMY  10/13/14   Hyperplastic; recall 10 yrs   Sleep study  09/16/2019   Home sleep study--severe OSA->CPAP recommended   VASECTOMY     WISDOM TOOTH EXTRACTION      Outpatient Medications Prior to Visit  Medication Sig Dispense Refill   allopurinol (ZYLOPRIM) 300 MG tablet Take 1 tablet (300 mg total) by mouth daily. 90 tablet 3   amLODipine (NORVASC) 10 MG tablet Take 1 tablet (10 mg total) by mouth daily. 90 tablet 3   aspirin EC 81 MG tablet Take 81 mg by mouth daily.     Multiple Vitamins-Minerals (MENS MULTIVITAMIN) TABS Take 1 tablet by mouth daily.     Plant Sterols and Stanols (CHOLESTOFF PO) Take by mouth daily.     gabapentin (NEURONTIN) 600 MG tablet Take 1 tablet (600 mg  total) by mouth 2 (two) times daily. 180 tablet 1   No facility-administered medications prior to visit.    Allergies  Allergen Reactions   Metformin And Related Diarrhea    Nausea and diarrhea   Prednisone Other (See Comments)    Headaches, muscle aches, fatigue   Lisinopril Cough    Review of Systems  As per HPI  PE:    12/10/2023   11:02 AM 12/10/2023   10:45 AM 10/08/2023    9:06 AM  Vitals with BMI  Height  5\' 5"    Weight  211 lbs   BMI  35.11   Systolic 130 156 161  Diastolic 84 80 77  Pulse  58      Physical Exam  Gen: Alert, well appearing.  Patient is oriented to person, place, time, and situation. AFFECT: pleasant, lucid thought and speech. Abdomen: Soft, rotund but nondistended.  No tenderness.  Bowel sounds are normal.  No mass or organomegaly or bruit.  He does have a small umbilical hernia that is reducible.  LABS:  Last CBC Lab Results  Component Value Date   WBC 7.5 09/10/2023   HGB 17.3 (H) 09/10/2023   HCT 51.3 09/10/2023   MCV 91.6 09/10/2023   MCH 29.8 08/06/2022   RDW 13.7 09/10/2023   PLT 188.0 09/10/2023   Last metabolic panel Lab Results  Component Value Date   GLUCOSE 147 (H) 09/10/2023   NA 142 09/10/2023   K 4.9 09/10/2023   CL 104 09/10/2023   CO2 31 09/10/2023   BUN 23 09/10/2023   CREATININE 1.02 09/10/2023   GFR 78.50 09/10/2023   CALCIUM 9.8 09/10/2023   PROT 6.9 09/10/2023   ALBUMIN 4.7 09/10/2023   BILITOT 0.8 09/10/2023   ALKPHOS 66 09/10/2023   AST 16 09/10/2023   ALT 17 09/10/2023   ANIONGAP 8 03/05/2017   Last hemoglobin A1c Lab Results  Component Value Date   HGBA1C 6.4 09/10/2023   IMPRESSION AND PLAN:  Lower abdominal pain.  Suspect abdominal wall discomfort.  Not sure if related to umbilical hernia or not but he is at the point where he would like to discuss surgical correction of this.  Referred to general surgery today. His pain is nearly completely resolved now.  An After Visit Summary was  printed and given to the patient.  FOLLOW UP: Return for as needed. Next CPE 09/09/2024  Signed:  Arletha Lady, MD           12/10/2023

## 2023-12-10 ENCOUNTER — Encounter: Payer: Self-pay | Admitting: Family Medicine

## 2023-12-10 ENCOUNTER — Ambulatory Visit: Admitting: Family Medicine

## 2023-12-10 VITALS — BP 130/84 | HR 58 | Temp 98.6°F | Ht 65.0 in | Wt 211.0 lb

## 2023-12-10 DIAGNOSIS — R1033 Periumbilical pain: Secondary | ICD-10-CM | POA: Diagnosis not present

## 2023-12-10 DIAGNOSIS — K429 Umbilical hernia without obstruction or gangrene: Secondary | ICD-10-CM

## 2023-12-10 MED ORDER — GABAPENTIN 600 MG PO TABS: 600.0000 mg | ORAL_TABLET | Freq: Two times a day (BID) | ORAL | 1 refills | Status: AC

## 2023-12-10 NOTE — Patient Instructions (Signed)
   Please schedule your yearly diabetic eye exam to ensure nothing has changed.

## 2024-04-07 DIAGNOSIS — H0288B Meibomian gland dysfunction left eye, upper and lower eyelids: Secondary | ICD-10-CM | POA: Diagnosis not present

## 2024-04-07 DIAGNOSIS — E119 Type 2 diabetes mellitus without complications: Secondary | ICD-10-CM | POA: Diagnosis not present

## 2024-04-07 DIAGNOSIS — H0288A Meibomian gland dysfunction right eye, upper and lower eyelids: Secondary | ICD-10-CM | POA: Diagnosis not present

## 2024-04-07 DIAGNOSIS — H2513 Age-related nuclear cataract, bilateral: Secondary | ICD-10-CM | POA: Diagnosis not present

## 2024-04-16 ENCOUNTER — Encounter: Payer: Self-pay | Admitting: Family Medicine

## 2024-04-16 ENCOUNTER — Ambulatory Visit: Payer: 59 | Admitting: Family Medicine

## 2024-04-16 VITALS — BP 160/84 | HR 63 | Temp 98.4°F | Ht 65.0 in | Wt 206.6 lb

## 2024-04-16 DIAGNOSIS — I10 Essential (primary) hypertension: Secondary | ICD-10-CM | POA: Diagnosis not present

## 2024-04-16 DIAGNOSIS — E119 Type 2 diabetes mellitus without complications: Secondary | ICD-10-CM

## 2024-04-16 LAB — POCT GLYCOSYLATED HEMOGLOBIN (HGB A1C)
HbA1c POC (<> result, manual entry): 6.4 % (ref 4.0–5.6)
HbA1c, POC (controlled diabetic range): 6.4 % (ref 0.0–7.0)
HbA1c, POC (prediabetic range): 6.4 % (ref 5.7–6.4)
Hemoglobin A1C: 6.4 % — AB (ref 4.0–5.6)

## 2024-04-16 LAB — MICROALBUMIN / CREATININE URINE RATIO
Creatinine,U: 134.9 mg/dL
Microalb Creat Ratio: 8.3 mg/g (ref 0.0–30.0)
Microalb, Ur: 1.1 mg/dL (ref 0.0–1.9)

## 2024-04-16 NOTE — Progress Notes (Signed)
 OFFICE VISIT  04/16/2024  CC:  Chief Complaint  Patient presents with   Medical Management of Chronic Issues    3 month f/u; pt fasting    Patient is a 63 y.o. male who presents for 15-month follow-up diabetes and hypertension. Off diabetic med at last visit his hemoglobin A1c was 6.4%.  We did not add medication back at that time, though. He had been off amlodipine  so getting him on this medication again brought his blood pressure down into acceptable range.  INTERIM HX: Feeling well. Not exercising/walking is much because of busy work in the heat. Diet is not as ideal as he would like.  Home blood pressures consistently around low 130s over 70s low 80s.  No acute concerns.  ROS --> no fevers, no CP, no SOB, no wheezing, no cough, no dizziness, no HAs, no rashes, no melena/hematochezia.  No polyuria or polydipsia.  No myalgias or arthralgias.  No focal weakness, paresthesias, or tremors.  No acute vision or hearing abnormalities.  No dysuria or unusual/new urinary urgency or frequency.  No recent changes in lower legs. No n/v/d or abd pain.  No palpitations.      Past Medical History:  Diagnosis Date   Asthma    grew out of it, esp after quitting smoking arond age 89 yrs old   Atypical chest pain 06/12/2014   Carpal tunnel syndrome    bilat, L>>R (Dr. Unice).  Hand symptoms prompted referral to rheum--lab w/u NEG.  Wrist splinting x 6 wks advised, not much help so low dose prednisone rx'd, then pt had to get steroid injection into L carpal tunnel 12/2016.   Cervical myelopathy [facility])    Surgical fusion--Dr Unice (cerv spinal stenosis).  Stable as of 08/2019 and 10/2020 neurosurg f/u.   Chronic low back pain    COVID-19 virus infection 05/2020   Diabetes mellitus without complication (HCC)    No diab retinop as of 12/2014   Gout    Knee and toe--allopurinol  since approx 2012 and has had no flares since being on this med (1/2 of 300 mg tab qd)   History of hyperkalemia 2015/2016    low K diet; cut lisinopril  from 10mg  to 5mg  qd 11/2014.   Hyperlipidemia    med x 1 trial=adverse side effects so he stopped it.  LDL 130s 08/2014, then dx'd with DM 2 shortly after, pt refused another trial of statin 05/2017 and 2020.   Hypertension    MDD (major depressive disorder), recurrent episode (HCC)    initiated tx 09/2020   OSA (obstructive sleep apnea)    Remote past: had cpap but didn't use much .  REPEAT HOME SLEEP STUDY 09/16/19-> severe OSA, pt got on CPAP after this.   Paresthesia of both hands    Dr. Unice: MRI 01/2018: no acute abnormality.  Stable-to-improved chronic spine/spinal cord changes, fusion good. MRI stable 10/2020->cont gabapentin  hs   Statin intolerance    Umbilical hernia 2016    Past Surgical History:  Procedure Laterality Date   ANTERIOR CERVICAL DECOMP/DISCECTOMY FUSION N/A 03/07/2017   Procedure: Cervical four-five, Cervical five-six, Cerivcal six- seven Anterior cervical decompression/discectomy/fusion WITH CERVICAL FIVE COREPECTOMY;  Surgeon: Unice Pac, MD;  Location: [facility] OR;  Service: Neurosurgery;  Laterality: N/A;  C4-5 C5-6 C6-7 Anterior cervical decompression/discectomy/fusion   BACK SURGERY  12/05/2016   Lumbar, Dr. Unice at [facility] surgical center   COLONOSCOPY W/ POLYPECTOMY  10/13/14   Hyperplastic; recall 10 yrs   Sleep study  09/16/2019  Home sleep study--severe OSA->CPAP recommended   VASECTOMY     WISDOM TOOTH EXTRACTION      Outpatient Medications Prior to Visit  Medication Sig Dispense Refill   allopurinol  (ZYLOPRIM ) 300 MG tablet Take 1 tablet (300 mg total) by mouth daily. 90 tablet 3   amLODipine  (NORVASC ) 10 MG tablet Take 1 tablet (10 mg total) by mouth daily. 90 tablet 3   aspirin  EC 81 MG tablet Take 81 mg by mouth daily.     gabapentin  (NEURONTIN ) 600 MG tablet Take 1 tablet (600 mg total) by mouth 2 (two) times daily. 180 tablet 1   Multiple Vitamins-Minerals (MENS MULTIVITAMIN) TABS Take 1 tablet by mouth daily.      Plant Sterols and Stanols (CHOLESTOFF PO) Take by mouth daily.     No facility-administered medications prior to visit.    Allergies  Allergen Reactions   Metformin  And Related Diarrhea    Nausea and diarrhea   Prednisone Other (See Comments)    Headaches, muscle aches, fatigue   Lisinopril  Cough    Review of Systems As per HPI  PE:    04/16/2024    8:13 AM 04/16/2024    7:55 AM 12/10/2023   11:02 AM  Vitals with BMI  Height  5' 5   Weight  206 lbs 10 oz   BMI  34.38   Systolic 140 150 869  Diastolic 84 82 84  Pulse  63      Physical Exam  Gen: Alert, well appearing.  Patient is oriented to person, place, time, and situation. AFFECT: pleasant, lucid thought and speech. CV: RRR (60 by me), no m/r/g.   LUNGS: CTA bilat, nonlabored resps, good aeration in all lung fields. EXT: no clubbing or cyanosis.  no edema.    LABS:  Last CBC Lab Results  Component Value Date   WBC 7.5 09/10/2023   HGB 17.3 (H) 09/10/2023   HCT 51.3 09/10/2023   MCV 91.6 09/10/2023   MCH 29.8 08/06/2022   RDW 13.7 09/10/2023   PLT 188.0 09/10/2023   Last metabolic panel Lab Results  Component Value Date   GLUCOSE 147 (H) 09/10/2023   NA 142 09/10/2023   K 4.9 09/10/2023   CL 104 09/10/2023   CO2 31 09/10/2023   BUN 23 09/10/2023   CREATININE 1.02 09/10/2023   GFR 78.50 09/10/2023   CALCIUM 9.8 09/10/2023   PROT 6.9 09/10/2023   ALBUMIN 4.7 09/10/2023   BILITOT 0.8 09/10/2023   ALKPHOS 66 09/10/2023   AST 16 09/10/2023   ALT 17 09/10/2023   ANIONGAP 8 03/05/2017   Last lipids Lab Results  Component Value Date   CHOL 189 09/10/2023   HDL 65.70 09/10/2023   LDLCALC 106 (H) 09/10/2023   TRIG 89.0 09/10/2023   CHOLHDL 3 09/10/2023   Last hemoglobin A1c Lab Results  Component Value Date   HGBA1C 6.4 (A) 04/16/2024   HGBA1C 6.4 04/16/2024   HGBA1C 6.4 04/16/2024   HGBA1C 6.4 04/16/2024   Last thyroid  functions Lab Results  Component Value Date   TSH 2.45  08/06/2022   Last vitamin B12 and Folate Lab Results  Component Value Date   VITAMINB12 515 08/08/2015   IMPRESSION AND PLAN:  #1 diabetes without complication, currently diet only. POC Hba1c today is 6.4%, which is stable. No changes today. Urine microalbumin/creatinine today.  2.  Hypertension, well-controlled.  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 Continue amlodipine  10 mg daily.  An After Visit Summary was printed and given to  the patient.  FOLLOW UP: Return in about 6 months (around 10/17/2024) for annual CPE (fasting). Next CPE January 2026 Signed:  Gerlene Hockey, MD           04/16/2024

## 2024-04-19 ENCOUNTER — Ambulatory Visit: Payer: Self-pay | Admitting: Family Medicine

## 2024-07-01 NOTE — Progress Notes (Deleted)
 PATIENT: Derrick Willis DOB: 30-Mar-1961  REASON FOR VISIT: follow up HISTORY FROM: patient  No chief complaint on file.     HISTORY OF PRESENT ILLNESS:  07/01/24 ALL: Derrick Willis returns for follow up for OSA on CPAP. He continues to do well on therapy. He is using CPAP nightly for about 7 hours, on average.   07/03/2023 ALL:  Derrick Willis returns for follow up for OSA on CPAP. He continues to do well on therapy. He is using CPAP nightly for about 7 hours, on average. He is sleeping well. He does mention concerns of his water chamber leaking water. He feels o ring seal is bad. He orders supplies online through [employer]. He reports stopping BP and DM meds. Labs and BP have been stable with diet and exercise. He is followed regularly by PCP. BP normally 130-140/80s.     05/09/2022 ALL: Derrick Willis returns for follow up for OSA on CPAP. He continues to do well. He is using CPAP nightly for about 6.5 hr. He admits that he may not use if out of town over night. He does take CPAP on longer trips. He changed to a nasal mask and feels it fits better. Leak has improved. He does note improvement in sleep quality and daytime energy when using CPAP.     05/07/2021 ALL: Derrick Willis returns for follow up for OSA on CPAP. He reports doing well. He is using CPAP most every night. He does note significant benefit when using CPAP. He does report needing to change his mask. He can tell that air is leaking when he wakes in the mornings. Otherwise, he is feeling well and without complaints.     05/04/2020 ALL:  Derrick Willis is a 63 y.o. male here today for follow up for OSA on CPAP.   Compliance report dated 04/03/2020 through 05/02/2020 reveals that he used CPAP 27 of the past 30 days for compliance of 90%.  He is CPAP greater than 4 hours 27 of the past 30 days for compliance of 90%.  Average usage on days used was 6 hours and 38 minutes.  Residual AHI was 0.6 on 6 to 18 cm of water and an EPR of 2.  There was no significant leak  noted.  HISTORY: (copied from my note on 11/02/2019)  Derrick Willis is a 63 y.o. male here today for follow up OSA recently started on CPAP therapy. HST revealed total AHI of 31.5/hr and REM sleep AHI of 52.3/hr. No prolonged hypoxemia noted.  Derrick Willis reports that he is doing very well on CPAP therapy.  He notes significant improvement in sleep quality and feels less fatigued throughout the day.  He is now sleeping through the night without waking to use the restroom.  He denies any difficulty with his machine.   Compliance report dated 10/02/2019 through 10/31/2019 reveals that he is used CPAP 30 of the last 30 days for compliance of 100%.  He used CPAP greater than 4 hours all 30 days for compliance of 100%.  Average usage was 6 hours and 35 minutes.  Residual AHI was 1.2 on 6 to 18 cm of water and an EPR of 2.  There was no significant leak noted.   HISTORY: (copied from  note on 09/06/2019)   Derrick Willis is a 63 year old Caucasian male patient seen upon referral on 09/06/2019 .  Chief concern according to patient :  I have a CPAP from years ago, but it didn't do me  any good. I had the study in [place], [place] at [facility].  The machine was mailed to me and I never followed up.    Mr. Lamarque had childhood asthma it lasted probably through his 62s as long as he was smoking which she quit around age 71.  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   He has a history of carpal tunnel syndrome surgically treated by Dr. Unice 2018 cervical myelopathy and surgical fusion December 2019 by Dr. Unice, lower back  degenerative disease.  Gout affecting knee and toe, hyperkalemia, hyperlipidemia, hypertension, in June 2019 Dr. Bethena evaluated him for paresthesias of both hands.  Is also history of umbilical hernia in 2016.  And diabetes mellitus.   DOT evaluation questioned his lack of CPAP use and asked for re-evaluation. He is also seeing Dr Unice next week.     Sleep relevant medical history: Nocturia- one, no Tonsillectomy , had cervical spine surgery, but no  deviated septum or sinus surgery, no UPPP.   he had tubes in his ears at kindergarten age.  Family medical /sleep history:  other family member on CPAP with OSA, insomnia, sleep walkers.    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 and lives in a household alone with his dog.  He is divorced, his wife reportedly was very bothered by his snoring.  He has 2 adult sons, one in DO school.  The patient currently works. Tobacco use; quit at age 63 .   ETOH use - 2-3 drinks a week, Caffeine intake in form of Coffee( part decaffeinated 4-6 cups a day ) Soda( none ) Tea ( once I a while ) or energy drinks. Regular exercise in form of walking     Sleep habits are as follows: The patient's dinner time is between 6-8  PM. The patient goes to bed at 10 PM and continues to sleep for many hours,. The preferred sleep position is sideways, with the support of 2 pillows. Dreams are reportedly frequent.  6 AM is the usual rise time. The patient wakes up spontaneously at 5-6 AM He reports  feeling refreshed or restored in AM, with symptoms such as dry mouth, but no morning headaches, and only mild residual fatigue.  Naps are taken in frequently    REVIEW OF SYSTEMS: Out of a complete 14 system review of symptoms, the patient complains only of the following symptoms, none and all other reviewed systems are negative.  ESS: 2/24, previously 4/24   ALLERGIES: Allergies  Allergen Reactions   Metformin  And  Related Diarrhea    Nausea and diarrhea   Prednisone Other (See Comments)    Headaches, muscle aches, fatigue   Lisinopril  Cough    HOME MEDICATIONS: Outpatient Medications Prior to Visit  Medication Sig Dispense Refill   allopurinol  (ZYLOPRIM ) 300 MG tablet Take 1 tablet (300 mg total) by mouth daily. 90 tablet 3   amLODipine  (NORVASC ) 10 MG tablet Take 1 tablet (10 mg total) by mouth daily. 90 tablet 3   aspirin  EC 81 MG tablet Take 81 mg by mouth daily.     gabapentin  (  NEURONTIN ) 600 MG tablet Take 1 tablet (600 mg total) by mouth 2 (two) times daily. 180 tablet 1   Multiple Vitamins-Minerals (MENS MULTIVITAMIN) TABS Take 1 tablet by mouth daily.     Plant Sterols and Stanols (CHOLESTOFF PO) Take by mouth daily.     No facility-administered medications prior to visit.    PAST MEDICAL HISTORY: Past Medical History:  Diagnosis Date   Asthma    grew out of it, esp after quitting smoking arond age 36 yrs old   Atypical chest pain 06/12/2014   Carpal tunnel syndrome    bilat, L>>R (Dr. Unice).  Hand symptoms prompted referral to rheum--lab w/u NEG.  Wrist splinting x 6 wks advised, not much help so low dose prednisone rx'd, then pt had to get steroid injection into L carpal tunnel 12/2016.   Cervical myelopathy [facility])    Surgical fusion--Dr Unice (cerv spinal stenosis).  Stable as of 08/2019 and 10/2020 neurosurg f/u.   Chronic low back pain    COVID-19 virus infection 05/2020   Diabetes mellitus without complication (HCC)    No diab retinop as of 12/2014   Gout    Knee and toe--allopurinol  since approx 2012 and has had no flares since being on this med (1/2 of 300 mg tab qd)   History of hyperkalemia 2015/2016   low K diet; cut lisinopril  from 10mg  to 5mg  qd 11/2014.   Hyperlipidemia    med x 1 trial=adverse side effects so he stopped it.  LDL 130s 08/2014, then dx'd with DM 2 shortly after, pt refused another trial of statin 05/2017 and 2020.   Hypertension    MDD (major  depressive disorder), recurrent episode    initiated tx 09/2020   OSA (obstructive sleep apnea)    Remote past: had cpap but didn't use much .  REPEAT HOME SLEEP STUDY 09/16/19-> severe OSA, pt got on CPAP after this.   Paresthesia of both hands    Dr. Unice: MRI 01/2018: no acute abnormality.  Stable-to-improved chronic spine/spinal cord changes, fusion good. MRI stable 10/2020->cont gabapentin  hs   Statin intolerance    Umbilical hernia 2016    PAST SURGICAL HISTORY: Past Surgical History:  Procedure Laterality Date   ANTERIOR CERVICAL DECOMP/DISCECTOMY FUSION N/A 03/07/2017   Procedure: Cervical four-five, Cervical five-six, Cerivcal six- seven Anterior cervical decompression/discectomy/fusion WITH CERVICAL FIVE COREPECTOMY;  Surgeon: Unice Pac, MD;  Location: [facility] OR;  Service: Neurosurgery;  Laterality: N/A;  C4-5 C5-6 C6-7 Anterior cervical decompression/discectomy/fusion   BACK SURGERY  12/05/2016   Lumbar, Dr. Unice at [facility] surgical center   COLONOSCOPY W/ POLYPECTOMY  10/13/14   Hyperplastic; recall 10 yrs   Sleep study  09/16/2019   Home sleep study--severe OSA->CPAP recommended   VASECTOMY     WISDOM TOOTH EXTRACTION      FAMILY HISTORY: Family History  Problem Relation Age of Onset   Arthritis Mother    Arthritis Father    Hyperlipidemia Father    Hypertension Father    Colon polyps Neg Hx    Esophageal cancer Neg Hx    Rectal cancer Neg Hx    Stomach cancer Neg Hx     SOCIAL HISTORY: Social History   Socioeconomic History   Marital status: Divorced    Spouse name: Not on file   Number of children: Not on file   Years of education: Not on file   Highest education level: Not on file  Occupational History   Not on file  Tobacco Use  Smoking status: Former    Current packs/day: 0.00    Average packs/day: 1.5 packs/day for 12.0 years (18.0 ttl pk-yrs)    Types: Cigars, Cigarettes    Start date: 05/31/1978    Quit date: 05/31/1990    Years since  quitting: 34.1   Smokeless tobacco: Never   Tobacco comments:    Quit smoking Cigars 10/2015  Vaping Use   Vaping status: Never Used  Substance and Sexual Activity   Alcohol use: Yes    Alcohol/week: 1.0 - 2.0 standard drink of alcohol    Types: 1 - 2 Glasses of wine per week    Comment: 2-3 x weekly   Drug use: No   Sexual activity: Not on file  Other Topics Concern   Not on file  Social History Narrative   Divorced.  Two children, both young adults (one son plans to go to med school).   Occupation: Grading contractor-also a visual merchandiser.   Education: 2 years of college.   Orig from [place], still lives there.   Tob 30 pack-yr hx, quit around age 67.   Social drinker.     No regular exercise.  Eating habits poor.   Social Drivers of Corporate Investment Banker Strain: Not on file  Food Insecurity: Not on file  Transportation Needs: Not on file  Physical Activity: Not on file  Stress: Not on file  Social Connections: Not on file  Intimate Partner Violence: Not on file      PHYSICAL EXAM  There were no vitals filed for this visit.     There is no height or weight on file to calculate BMI.  Generalized: Well developed, in no acute distress  Cardiology: normal rate and rhythm, no murmur noted Respiratory: clear to auscultation bilaterally  Neurological examination  Mentation: Alert oriented to time, place, history taking. Follows all commands speech and language fluent Cranial nerve II-XII: Pupils were equal round reactive to light. Extraocular movements were full, visual field were full  Motor: The motor testing reveals 5 over 5 strength of all 4 extremities. Good symmetric motor tone is noted throughout.  Gait and station: Gait is normal.    DIAGNOSTIC DATA (LABS, IMAGING, TESTING) - I reviewed patient records, labs, notes, testing and imaging myself where available.      No data to display           Lab Results  Component Value Date   WBC 7.5 09/10/2023    HGB 17.3 (H) 09/10/2023   HCT 51.3 09/10/2023   MCV 91.6 09/10/2023   PLT 188.0 09/10/2023      Component Value Date/Time   NA 142 09/10/2023 1008   K 4.9 09/10/2023 1008   CL 104 09/10/2023 1008   CO2 31 09/10/2023 1008   GLUCOSE 147 (H) 09/10/2023 1008   BUN 23 09/10/2023 1008   CREATININE 1.02 09/10/2023 1008   CREATININE 1.06 11/05/2022 1040   CALCIUM 9.8 09/10/2023 1008   PROT 6.9 09/10/2023 1008   ALBUMIN 4.7 09/10/2023 1008   AST 16 09/10/2023 1008   ALT 17 09/10/2023 1008   ALKPHOS 66 09/10/2023 1008   BILITOT 0.8 09/10/2023 1008   GFRNONAA >60 03/05/2017 0951   GFRAA >60 03/05/2017 0951   Lab Results  Component Value Date   CHOL 189 09/10/2023   HDL 65.70 09/10/2023   LDLCALC 106 (H) 09/10/2023   TRIG 89.0 09/10/2023   CHOLHDL 3 09/10/2023   Lab Results  Component Value Date   HGBA1C 6.4 (  A) 04/16/2024   HGBA1C 6.4 04/16/2024   HGBA1C 6.4 04/16/2024   HGBA1C 6.4 04/16/2024   Lab Results  Component Value Date   VITAMINB12 515 08/08/2015   Lab Results  Component Value Date   TSH 2.45 08/06/2022       ASSESSMENT AND PLAN 63 y.o. year old male  has a past medical history of Asthma, Atypical chest pain (06/12/2014), Carpal tunnel syndrome, Cervical myelopathy (HCC), Chronic low back pain, COVID-19 virus infection (05/2020), Diabetes mellitus without complication (HCC), Gout, History of hyperkalemia (2015/2016), Hyperlipidemia, Hypertension, MDD (major depressive disorder), recurrent episode, OSA (obstructive sleep apnea), Paresthesia of both hands, Statin intolerance, and Umbilical hernia (2016). here with   No diagnosis found.    Hezzie is doing very well on CPAP therapy.  Compliance report reveals optimal compliance.  He was encouraged to continue using CPAP nightly and for greater than 4 hours each night. Supply orders updated. Healthy lifestyle habits encouraged.  He will follow-up with primary care as directed. He was encouraged to keep an eye on his  BP. Usually 130-140/80s. He will return to see us  in 1 year, sooner if needed.  He verbalizes understanding and agreement with this plan.   No orders of the defined types were placed in this encounter.     No orders of the defined types were placed in this encounter.      Greig Forbes, FNP-C 07/01/2024, 8:12 AM [facility] [address], [address] [place], [place] [address] [phone]

## 2024-07-01 NOTE — Patient Instructions (Incomplete)

## 2024-07-05 ENCOUNTER — Telehealth: Payer: Self-pay | Admitting: Family Medicine

## 2024-07-05 NOTE — Telephone Encounter (Signed)
 Request to reschedule appointment due to a conflict

## 2024-07-06 ENCOUNTER — Ambulatory Visit: Payer: 59 | Admitting: Family Medicine

## 2024-07-07 ENCOUNTER — Ambulatory Visit: Admitting: Family Medicine

## 2024-07-07 DIAGNOSIS — G4733 Obstructive sleep apnea (adult) (pediatric): Secondary | ICD-10-CM

## 2024-09-09 NOTE — Progress Notes (Unsigned)
 SABRA

## 2024-09-10 ENCOUNTER — Encounter: Payer: Self-pay | Admitting: Family Medicine

## 2024-09-10 ENCOUNTER — Ambulatory Visit: Payer: Self-pay | Admitting: Family Medicine

## 2024-09-10 VITALS — BP 175/91 | HR 73 | Ht 65.5 in | Wt 209.4 lb

## 2024-09-10 DIAGNOSIS — G4733 Obstructive sleep apnea (adult) (pediatric): Secondary | ICD-10-CM

## 2024-09-10 NOTE — Progress Notes (Signed)
 "   PATIENT: Derrick Willis DOB: 1961-04-17  REASON FOR VISIT: follow up HISTORY FROM: patient  Chief Complaint  Patient presents with   Follow-up    Rm 1, alone,  cpap follow up.  Feels like needs mask fitting.  (Has leaks)      HISTORY OF PRESENT ILLNESS:  09/10/24 ALL: Derrick Willis returns for follow up for OSA on CPAP. He continues to do fairly well on therapy. He is using therapy most nights for about 7 hours, on average. He does feel that he has more air leaking around mask. He orders from [employer]. He recently switched from a nasal mask to a FFM. He has facial hair. He wishes to wait one more year before replacing his machine.     07/03/2023 ALL:  Derrick Willis returns for follow up for OSA on CPAP. He continues to do well on therapy. He is using CPAP nightly for about 7 hours, on average. He is sleeping well. He does mention concerns of his water chamber leaking water. He feels o ring seal is bad. He orders supplies online through [employer]. He reports stopping BP and DM meds. Labs and BP have been stable with diet and exercise. He is followed regularly by PCP. BP normally 130-140/80s.     05/09/2022 ALL: Derrick Willis returns for follow up for OSA on CPAP. He continues to do well. He is using CPAP nightly for about 6.5 hr. He admits that he may not use if out of town over night. He does take CPAP on longer trips. He changed to a nasal mask and feels it fits better. Leak has improved. He does note improvement in sleep quality and daytime energy when using CPAP.     05/07/2021 ALL: Derrick Willis returns for follow up for OSA on CPAP. He reports doing well. He is using CPAP most every night. He does note significant benefit when using CPAP. He does report needing to change his mask. He can tell that air is leaking when he wakes in the mornings. Otherwise, he is feeling well and without complaints.     05/04/2020 ALL:  Derrick Willis is a 64 y.o. male here today for follow up for OSA on CPAP.   Compliance report  dated 04/03/2020 through 05/02/2020 reveals that he used CPAP 27 of the past 30 days for compliance of 90%.  He is CPAP greater than 4 hours 27 of the past 30 days for compliance of 90%.  Average usage on days used was 6 hours and 38 minutes.  Residual AHI was 0.6 on 6 to 18 cm of water and an EPR of 2.  There was no significant leak noted.  HISTORY: (copied from my note on 11/02/2019)  Derrick Willis is a 64 y.o. male here today for follow up OSA recently started on CPAP therapy. HST revealed total AHI of 31.5/hr and REM sleep AHI of 52.3/hr. No prolonged hypoxemia noted.  Derrick Willis reports that he is doing very well on CPAP therapy.  He notes significant improvement in sleep quality and feels less fatigued throughout the day.  He is now sleeping through the night without waking to use the restroom.  He denies any difficulty with his machine.   Compliance report dated 10/02/2019 through 10/31/2019 reveals that he is used CPAP 30 of the last 30 days for compliance of 100%.  He used CPAP greater than 4 hours all 30 days for compliance of 100%.  Average usage was 6 hours and 35 minutes.  Residual AHI  was 1.2 on 6 to 18 cm of water and an EPR of 2.  There was no significant leak noted.   HISTORY: (copied from  note on 09/06/2019)   Derrick Willis is a 64 year old Caucasian male patient seen upon referral on 09/06/2019 .  Chief concern according to patient :  I have a CPAP from years ago, but it didn't do me any good. I had the study in [place], [place] at [facility].  The machine was mailed to me and I never followed up.    Derrick Willis had childhood asthma it lasted probably through his 84s as long as he was smoking which she quit around age 89.  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   He has a history of carpal tunnel syndrome surgically treated by Dr. Unice 2018 cervical myelopathy and surgical fusion December 2019 by Dr. Unice, lower back degenerative disease.  Gout affecting knee and toe, hyperkalemia, hyperlipidemia, hypertension, in June 2019 Dr. Bethena evaluated him for paresthesias of both hands.  Is also history of umbilical hernia in 2016.  And diabetes mellitus.   DOT evaluation questioned his lack of CPAP use and asked for re-evaluation. He is also seeing Dr Unice next week.     Sleep relevant medical history: Nocturia- one, no Tonsillectomy , had cervical spine surgery, but no  deviated septum or sinus surgery, no UPPP.   he had tubes in his ears at kindergarten age.  Family medical /sleep history:  other family member on CPAP with OSA, insomnia, sleep walkers.    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 and lives in a household alone with his dog.  He is divorced, his wife reportedly was very bothered by his snoring.  He has 2 adult sons, one in DO school.  The patient currently works. Tobacco use; quit at age 20 .   ETOH use - 2-3 drinks a week, Caffeine intake in form of Coffee( part decaffeinated 4-6 cups a day ) Soda( none ) Tea ( once I a while ) or energy drinks. Regular exercise in form of walking     Sleep habits are as follows: The patient's dinner time is between 6-8  PM. The patient goes to bed at 10 PM and continues to sleep for many hours,. The preferred sleep position is sideways, with the support of 2 pillows. Dreams are reportedly frequent.  6 AM is the usual rise time. The patient wakes up spontaneously at 5-6 AM He reports  feeling refreshed or restored in AM, with symptoms such as dry mouth, but no morning headaches,  and only mild residual fatigue.  Naps are taken in frequently    REVIEW OF SYSTEMS: Out of a complete 14 system review of symptoms, the patient complains only of the following symptoms, none and all other reviewed systems are negative.  ESS: 2/24, previously 4/24   ALLERGIES: Allergies  Allergen Reactions   Metformin  And Related Diarrhea    Nausea and diarrhea   Prednisone Other (See Comments)    Headaches, muscle aches, fatigue   Lisinopril  Cough  HOME MEDICATIONS: Outpatient Medications Prior to Visit  Medication Sig Dispense Refill   allopurinol  (ZYLOPRIM ) 300 MG tablet Take 1 tablet (300 mg total) by mouth daily. 90 tablet 3   amLODipine  (NORVASC ) 10 MG tablet Take 1 tablet (10 mg total) by mouth daily. 90 tablet 3   aspirin  EC 81 MG tablet Take 81 mg by mouth daily.     gabapentin  (NEURONTIN ) 600 MG tablet Take 1 tablet (600 mg total) by mouth 2 (two) times daily. 180 tablet 1   Multiple Vitamins-Minerals (MENS MULTIVITAMIN) TABS Take 1 tablet by mouth daily.     Plant Sterols and Stanols (CHOLESTOFF PO) Take by mouth daily.     No facility-administered medications prior to visit.    PAST MEDICAL HISTORY: Past Medical History:  Diagnosis Date   Asthma    grew out of it, esp after quitting smoking arond age 51 yrs old   Atypical chest pain 06/12/2014   Carpal tunnel syndrome    bilat, L>>R (Dr. Unice).  Hand symptoms prompted referral to rheum--lab w/u NEG.  Wrist splinting x 6 wks advised, not much help so low dose prednisone rx'd, then pt had to get steroid injection into L carpal tunnel 12/2016.   Cervical myelopathy [facility])    Surgical fusion--Dr Unice (cerv spinal stenosis).  Stable as of 08/2019 and 10/2020 neurosurg f/u.   Chronic low back pain    COVID-19 virus infection 05/2020   Diabetes mellitus without complication (HCC)    No diab retinop as of 12/2014   Gout    Knee and toe--allopurinol  since approx 2012 and has had no flares since being on this med (1/2  of 300 mg tab qd)   History of hyperkalemia 2015/2016   low K diet; cut lisinopril  from 10mg  to 5mg  qd 11/2014.   Hyperlipidemia    med x 1 trial=adverse side effects so he stopped it.  LDL 130s 08/2014, then dx'd with DM 2 shortly after, pt refused another trial of statin 05/2017 and 2020.   Hypertension    MDD (major depressive disorder), recurrent episode    initiated tx 09/2020   OSA (obstructive sleep apnea)    Remote past: had cpap but didn't use much .  REPEAT HOME SLEEP STUDY 09/16/19-> severe OSA, pt got on CPAP after this.   Paresthesia of both hands    Dr. Unice: MRI 01/2018: no acute abnormality.  Stable-to-improved chronic spine/spinal cord changes, fusion good. MRI stable 10/2020->cont gabapentin  hs   Statin intolerance    Umbilical hernia 2016    PAST SURGICAL HISTORY: Past Surgical History:  Procedure Laterality Date   ANTERIOR CERVICAL DECOMP/DISCECTOMY FUSION N/A 03/07/2017   Procedure: Cervical four-five, Cervical five-six, Cerivcal six- seven Anterior cervical decompression/discectomy/fusion WITH CERVICAL FIVE COREPECTOMY;  Surgeon: Unice Pac, MD;  Location: [facility] OR;  Service: Neurosurgery;  Laterality: N/A;  C4-5 C5-6 C6-7 Anterior cervical decompression/discectomy/fusion   BACK SURGERY  12/05/2016   Lumbar, Dr. Unice at [facility] center   COLONOSCOPY W/ POLYPECTOMY  10/13/14   Hyperplastic; recall 10 yrs   Sleep study  09/16/2019   Home sleep study--severe OSA->CPAP recommended   VASECTOMY     WISDOM TOOTH EXTRACTION      FAMILY HISTORY: Family History  Problem Relation Age of Onset   Arthritis Mother    Arthritis Father    Hyperlipidemia Father    Hypertension Father    Colon polyps Neg Hx    Esophageal cancer Neg Hx    Rectal cancer Neg Hx  Stomach cancer Neg Hx     SOCIAL HISTORY: Social History   Socioeconomic History   Marital status: Divorced    Spouse name: Not on file   Number of children: Not on file   Years of education: Not on  file   Highest education level: Not on file  Occupational History   Not on file  Tobacco Use   Smoking status: Former    Current packs/day: 0.00    Average packs/day: 1.5 packs/day for 12.0 years (18.0 ttl pk-yrs)    Types: Cigars, Cigarettes    Start date: 05/31/1978    Quit date: 05/31/1990    Years since quitting: 34.3   Smokeless tobacco: Never   Tobacco comments:    Quit smoking Cigars 10/2015  Vaping Use   Vaping status: Never Used  Substance and Sexual Activity   Alcohol use: Yes    Alcohol/week: 1.0 - 2.0 standard drink of alcohol    Types: 1 - 2 Glasses of wine per week    Comment: 2-3 x weekly   Drug use: No   Sexual activity: Not on file  Other Topics Concern   Not on file  Social History Narrative   Divorced.  Two children, both young adults (one son plans to go to med school).   Occupation: Grading contractor-also a visual merchandiser.   Education: 2 years of college.   Orig from [place], still lives there.   Tob 30 pack-yr hx, quit around age 59.   Social drinker.     No regular exercise.  Eating habits poor.   Social Drivers of Health   Tobacco Use: Medium Risk (09/10/2024)   Patient History    Smoking Tobacco Use: Former    Smokeless Tobacco Use: Never    Passive Exposure: Not on Actuary Strain: Not on file  Food Insecurity: Not on file  Transportation Needs: Not on file  Physical Activity: Not on file  Stress: Not on file  Social Connections: Not on file  Intimate Partner Violence: Not on file  Depression (PHQ2-9): Low Risk (12/10/2023)   Depression (PHQ2-9)    PHQ-2 Score: 0  Alcohol Screen: Not on file  Housing: Not on file  Utilities: Not on file  Health Literacy: Not on file      PHYSICAL EXAM  Vitals:   09/10/24 1000  BP: (!) 175/91  Pulse: 73  Weight: 209 lb 6.4 oz (95 kg)  Height: 5' 5.5 (1.664 m)      Body mass index is 34.32 kg/m.  Generalized: Well developed, in no acute distress  Cardiology: normal rate and  rhythm, no murmur noted Respiratory: clear to auscultation bilaterally  Neurological examination  Mentation: Alert oriented to time, place, history taking. Follows all commands speech and language fluent Cranial nerve II-XII: Pupils were equal round reactive to light. Extraocular movements were full, visual field were full  Motor: The motor testing reveals 5 over 5 strength of all 4 extremities. Good symmetric motor tone is noted throughout.  Gait and station: Gait is normal.    DIAGNOSTIC DATA (LABS, IMAGING, TESTING) - I reviewed patient records, labs, notes, testing and imaging myself where available.      No data to display           Lab Results  Component Value Date   WBC 7.5 09/10/2023   HGB 17.3 (H) 09/10/2023   HCT 51.3 09/10/2023   MCV 91.6 09/10/2023   PLT 188.0 09/10/2023      Component Value  Date/Time   NA 142 09/10/2023 1008   K 4.9 09/10/2023 1008   CL 104 09/10/2023 1008   CO2 31 09/10/2023 1008   GLUCOSE 147 (H) 09/10/2023 1008   BUN 23 09/10/2023 1008   CREATININE 1.02 09/10/2023 1008   CREATININE 1.06 11/05/2022 1040   CALCIUM 9.8 09/10/2023 1008   PROT 6.9 09/10/2023 1008   ALBUMIN 4.7 09/10/2023 1008   AST 16 09/10/2023 1008   ALT 17 09/10/2023 1008   ALKPHOS 66 09/10/2023 1008   BILITOT 0.8 09/10/2023 1008   GFRNONAA >60 03/05/2017 0951   GFRAA >60 03/05/2017 0951   Lab Results  Component Value Date   CHOL 189 09/10/2023   HDL 65.70 09/10/2023   LDLCALC 106 (H) 09/10/2023   TRIG 89.0 09/10/2023   CHOLHDL 3 09/10/2023   Lab Results  Component Value Date   HGBA1C 6.4 (A) 04/16/2024   HGBA1C 6.4 04/16/2024   HGBA1C 6.4 04/16/2024   HGBA1C 6.4 04/16/2024   Lab Results  Component Value Date   VITAMINB12 515 08/08/2015   Lab Results  Component Value Date   TSH 2.45 08/06/2022       ASSESSMENT AND PLAN 64 y.o. year old male  has a past medical history of Asthma, Atypical chest pain (06/12/2014), Carpal tunnel syndrome, Cervical  myelopathy (HCC), Chronic low back pain, COVID-19 virus infection (05/2020), Diabetes mellitus without complication (HCC), Gout, History of hyperkalemia (2015/2016), Hyperlipidemia, Hypertension, MDD (major depressive disorder), recurrent episode, OSA (obstructive sleep apnea), Paresthesia of both hands, Statin intolerance, and Umbilical hernia (2016). here with     ICD-10-CM   1. OSA on CPAP  G47.33       Jakim is doing very well on CPAP therapy.  Compliance report reveals optimal compliance.  He was encouraged to continue using CPAP nightly and for greater than 4 hours each night. Supply orders updated. Healthy lifestyle habits encouraged.  He will follow-up with primary care as directed. He was encouraged to keep an eye on his BP. Usually 130-140/80s. He will return to see us  in 1 year, sooner if needed.  He verbalizes understanding and agreement with this plan.   No orders of the defined types were placed in this encounter.     No orders of the defined types were placed in this encounter.      Greig Forbes, FNP-C 09/10/2024, 10:23 AM [facility] [address] [place], [place] [address] [phone] "

## 2024-09-23 ENCOUNTER — Other Ambulatory Visit: Payer: Self-pay | Admitting: Family Medicine

## 2024-09-27 NOTE — Progress Notes (Signed)
 Derrick Willis                                          MRN: [id number]   09/27/2024   The [facility] Quality Team Specialist reviewed this patient medical record for the purposes of chart review for care gap closure. The following were reviewed: chart review for care gap closure-controlling blood pressure.    [facility] Quality Team

## 2024-10-21 ENCOUNTER — Encounter: Admitting: Family Medicine

## 2025-07-25 ENCOUNTER — Ambulatory Visit: Admitting: Family Medicine

## 2025-09-19 ENCOUNTER — Ambulatory Visit: Admitting: Family Medicine
